# Patient Record
Sex: Female | Born: 1990 | Race: White | Hispanic: No | Marital: Single | Smoking: Current every day smoker
Health system: Southern US, Community
[De-identification: ages and names within clinical notes are randomized; demographics above are authoritative.]

## PROBLEM LIST (undated history)

## (undated) ENCOUNTER — Inpatient Hospital Stay (HOSPITAL_COMMUNITY): Payer: Self-pay

## (undated) DIAGNOSIS — Z30432 Encounter for removal of intrauterine contraceptive device: Secondary | ICD-10-CM

## (undated) DIAGNOSIS — Z975 Presence of (intrauterine) contraceptive device: Secondary | ICD-10-CM

## (undated) DIAGNOSIS — E039 Hypothyroidism, unspecified: Secondary | ICD-10-CM

## (undated) DIAGNOSIS — D509 Iron deficiency anemia, unspecified: Secondary | ICD-10-CM

## (undated) DIAGNOSIS — Z309 Encounter for contraceptive management, unspecified: Secondary | ICD-10-CM

## (undated) HISTORY — DX: Hypothyroidism, unspecified: E03.9

## (undated) HISTORY — DX: Presence of (intrauterine) contraceptive device: Z97.5

## (undated) HISTORY — DX: Iron deficiency anemia, unspecified: D50.9

## (undated) HISTORY — DX: Encounter for contraceptive management, unspecified: Z30.9

## (undated) HISTORY — PX: WISDOM TOOTH EXTRACTION: SHX21

## (undated) HISTORY — DX: Encounter for removal of intrauterine contraceptive device: Z30.432

---
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
## 2014-11-10 ENCOUNTER — Encounter: Payer: Self-pay | Admitting: *Deleted

## 2014-11-24 ENCOUNTER — Other Ambulatory Visit: Payer: Self-pay | Admitting: Women's Health

## 2014-11-27 ENCOUNTER — Other Ambulatory Visit (HOSPITAL_COMMUNITY)
Admission: RE | Admit: 2014-11-27 | Discharge: 2014-11-27 | Disposition: A | Discharge status: Home / Self Care | Payer: Medicaid Other | Source: Ambulatory Visit | Attending: Adult Health | Admitting: Adult Health

## 2014-11-27 ENCOUNTER — Ambulatory Visit (INDEPENDENT_AMBULATORY_CARE_PROVIDER_SITE_OTHER): Payer: Medicaid Other | Admitting: Adult Health

## 2014-11-27 ENCOUNTER — Encounter: Payer: Self-pay | Admitting: Adult Health

## 2014-11-27 ENCOUNTER — Other Ambulatory Visit: Payer: Self-pay | Admitting: Adult Health

## 2014-11-27 VITALS — BP 112/60 | HR 80 | Ht 67.75 in | Wt 121.0 lb

## 2014-11-27 DIAGNOSIS — Z113 Encounter for screening for infections with a predominantly sexual mode of transmission: Secondary | ICD-10-CM | POA: Diagnosis present

## 2014-11-27 DIAGNOSIS — Z308 Encounter for other contraceptive management: Secondary | ICD-10-CM

## 2014-11-27 DIAGNOSIS — Z975 Presence of (intrauterine) contraceptive device: Secondary | ICD-10-CM | POA: Insufficient documentation

## 2014-11-27 DIAGNOSIS — Z01411 Encounter for gynecological examination (general) (routine) with abnormal findings: Secondary | ICD-10-CM | POA: Insufficient documentation

## 2014-11-27 DIAGNOSIS — Z30011 Encounter for initial prescription of contraceptive pills: Secondary | ICD-10-CM

## 2014-11-27 DIAGNOSIS — Z01419 Encounter for gynecological examination (general) (routine) without abnormal findings: Secondary | ICD-10-CM

## 2014-11-27 DIAGNOSIS — Z309 Encounter for contraceptive management, unspecified: Secondary | ICD-10-CM

## 2014-11-27 DIAGNOSIS — Z30431 Encounter for routine checking of intrauterine contraceptive device: Secondary | ICD-10-CM

## 2014-11-27 HISTORY — DX: Encounter for contraceptive management, unspecified: Z30.9

## 2014-11-27 HISTORY — DX: Presence of (intrauterine) contraceptive device: Z97.5

## 2014-11-27 MED ORDER — NORETHIN ACE-ETH ESTRAD-FE 1-20 MG-MCG(24) PO CHEW: 1.0000 | CHEWABLE_TABLET | Freq: Every day | ORAL | Status: DC

## 2014-11-27 NOTE — Patient Instructions (Signed)
Start minastrin Sunday  Use condoms  Return in 1 week for IUD removal Oral Contraception Use Oral contraceptive pills (OCPs) are medicines taken to prevent pregnancy. OCPs work by preventing the ovaries from releasing eggs. The hormones in OCPs also cause the cervical mucus to thicken, preventing the sperm from entering the uterus. The hormones also cause the uterine lining to become thin, not allowing a fertilized egg to attach to the inside of the uterus. OCPs are highly effective when taken exactly as prescribed. However, OCPs do not prevent sexually transmitted diseases (STDs). Safe sex practices, such as using condoms along with an OCP, can help prevent STDs. Before taking OCPs, you may have a physical exam and Pap test. Your health care provider may also order blood tests if necessary. Your health care provider will make sure you are a good candidate for oral contraception. Discuss with your health care provider the possible side effects of the OCP you may be prescribed. When starting an OCP, it can take 2 to 3 months for the body to adjust to the changes in hormone levels in your body.  HOW TO TAKE ORAL CONTRACEPTIVE PILLS Your health care provider may advise you on how to start taking the first cycle of OCPs. Otherwise, you can:   Start on day 1 of your menstrual period. You will not need any backup contraceptive protection with this start time.   Start on the first Sunday after your menstrual period or the day you get your prescription. In these cases, you will need to use backup contraceptive protection for the first week.   Start the pill at any time of your cycle. If you take the pill within 5 days of the start of your period, you are protected against pregnancy right away. In this case, you will not need a backup form of birth control. If you start at any other time of your menstrual cycle, you will need to use another form of birth control for 7 days. If your OCP is the type called a  minipill, it will protect you from pregnancy after taking it for 2 days (48 hours). After you have started taking OCPs:   If you forget to take 1 pill, take it as soon as you remember. Take the next pill at the regular time.   If you miss 2 or more pills, call your health care provider because different pills have different instructions for missed doses. Use backup birth control until your next menstrual period starts.   If you use a 28-day pack that contains inactive pills and you miss 1 of the last 7 pills (pills with no hormones), it will not matter. Throw away the rest of the non-hormone pills and start a new pill pack.  No matter which day you start the OCP, you will always start a new pack on that same day of the week. Have an extra pack of OCPs and a backup contraceptive method available in case you miss some pills or lose your OCP pack.  HOME CARE INSTRUCTIONS   Do not smoke.   Always use a condom to protect against STDs. OCPs do not protect against STDs.   Use a calendar to mark your menstrual period days.   Read the information and directions that came with your OCP. Talk to your health care provider if you have questions.  SEEK MEDICAL CARE IF:   You develop nausea and vomiting.   You have abnormal vaginal discharge or bleeding.   You develop  a rash.   You miss your menstrual period.   You are losing your hair.   You need treatment for mood swings or depression.   You get dizzy when taking the OCP.   You develop acne from taking the OCP.   You become pregnant.  SEEK IMMEDIATE MEDICAL CARE IF:   You develop chest pain.   You develop shortness of breath.   You have an uncontrolled or severe headache.   You develop numbness or slurred speech.   You develop visual problems.   You develop pain, redness, and swelling in the legs.  Document Released: 10/26/2011 Document Revised: 03/23/2014 Document Reviewed: 04/27/2013 [facility] Patient  Information 2015 [place] [place]. This information is not intended to replace advice given to you by your health care provider. Make sure you discuss any questions you have with your health care provider.

## 2014-11-27 NOTE — Progress Notes (Signed)
Patient ID: Debra [name], female   DOB: [birth year], 24 y.o.   MRN: [id number] History of Present Illness: Debra [name] is a 24 year old white female in for pap and physical and get on OCs, IUD needs removal.   Current Medications, Allergies, Past Medical History, Past Surgical History, Family History and Social History were reviewed in [facility] electronic [facility].     Review of Systems: Patient denies any headaches, blurred vision, shortness of breath, chest pain, abdominal pain, problems with bowel movements, urination, or intercourse.  No joint pain or mood swings.Spotting some, had IUD for almost 6 years.   Physical Exam:BP 112/60 mmHg  Pulse 80  Ht 5' 7.75" (1.721 m)  Wt 121 lb (54.885 kg)  BMI 18.53 kg/m2 General:  Well developed, well nourished, no acute distress Skin:  Warm and dry Neck:  Midline trachea, normal thyroid Lungs; Clear to auscultation bilaterally Breast:  No dominant palpable mass, retraction, or nipple discharge Cardiovascular: Regular rate and rhythm Abdomen:  Soft, non tender, no hepatosplenomegaly Pelvic:  External genitalia is normal in appearance.  The vagina is normal in appearance, has pink discharge. The cervix is smooth with + IUD strings.Pap with GC/CHL performed.  Uterus is felt to be normal size, shape, and contour.  No   adnexal masses or tenderness noted. Extremities:  No swelling or varicosities noted Psych:  No mood changes, alert and cooperative,seems happy  Impression:  Well woman gyn exam with pap IUD in place  Contraceptive management   Plan: Rx minastrin take 1 daily starting Sunday with 11 refills, use condoms Return in 1 week for IUD removal  Physical in 1 year Check HIV,RPR and HSV 2 Review handout on OC use

## 2014-11-28 LAB — RPR

## 2014-11-28 LAB — HIV ANTIBODY (ROUTINE TESTING W REFLEX): HIV: NONREACTIVE

## 2014-11-30 ENCOUNTER — Telehealth: Payer: Self-pay | Admitting: Adult Health

## 2014-11-30 LAB — HSV 2 ANTIBODY, IGG: HSV 2 Glycoprotein G Ab, IgG: <0.1 IV

## 2014-11-30 LAB — CYTOLOGY - PAP

## 2014-11-30 NOTE — Telephone Encounter (Signed)
Spoke with pt. Pt to pick up med and start it today. JSY

## 2014-11-30 NOTE — Telephone Encounter (Signed)
I called pharmacy and they got the med(birth control pill). Pt just needs to go by pharmacy and pick up. Start med today and use backup. Left message. JSY

## 2014-12-03 ENCOUNTER — Telehealth: Payer: Self-pay | Admitting: *Deleted

## 2014-12-03 NOTE — Telephone Encounter (Signed)
Try not to smoke but can up til 35 then can't take COC but can POP

## 2014-12-03 NOTE — Telephone Encounter (Signed)
Spoke with pt. She was prescribed Minastrin and the pamphlet read if you smoke, it's not a good idea to take med. She smokes half to one PPD. Please advise. Thanks!! JSY

## 2014-12-04 ENCOUNTER — Encounter: Payer: Self-pay | Admitting: Adult Health

## 2014-12-04 ENCOUNTER — Ambulatory Visit (INDEPENDENT_AMBULATORY_CARE_PROVIDER_SITE_OTHER): Payer: Medicaid Other | Admitting: Adult Health

## 2014-12-04 VITALS — BP 122/82 | Ht 69.0 in | Wt 123.0 lb

## 2014-12-04 DIAGNOSIS — Z3041 Encounter for surveillance of contraceptive pills: Secondary | ICD-10-CM

## 2014-12-04 DIAGNOSIS — Z30432 Encounter for removal of intrauterine contraceptive device: Secondary | ICD-10-CM

## 2014-12-04 HISTORY — DX: Encounter for removal of intrauterine contraceptive device: Z30.432

## 2014-12-04 NOTE — Patient Instructions (Signed)
Continue OCs Use condoms Follow up prn

## 2014-12-04 NOTE — Progress Notes (Signed)
Subjective:     Patient ID: Debra [name], female   DOB: 1990-12-16, 24 y.o.   MRN: [id number]  HPI Debra [name] is a 24 year old white female in for IUD removal, she is taking her OCs and doing Ok with them.  Review of Systems See HPI Reviewed past medical,surgical, social and family history. Reviewed medications and allergies.     Objective:   Physical Exam BP 122/82 mmHg  Ht 5\' 9"  (1.753 m)  Wt 123 lb (55.792 kg)  BMI 18.16 kg/m2   Skin warm and dry.Pelvic: external genitalia is normal in appearance, vagina: white discharge without odor, cervix:smooth, IUD strings seen and grasped with forceps and IUD easily removed, uterus: normal size, shape and contour, non tender, no masses felt, adnexa: no masses or tenderness noted.  Try to decrease smoking.  Assessment:     IUD removal Contraceptive management    Plan:     Continue OCs Use condoms Follow up prn

## 2015-10-21 ENCOUNTER — Ambulatory Visit: Payer: Self-pay | Admitting: Physician Assistant

## 2016-11-20 DIAGNOSIS — O165 Unspecified maternal hypertension, complicating the puerperium: Secondary | ICD-10-CM

## 2016-11-20 HISTORY — DX: Unspecified maternal hypertension, complicating the puerperium: O16.5

## 2016-11-20 NOTE — L&D Delivery Note (Signed)
Delivery Note At 9:18 PM a viable and healthy female was delivered via Vaginal, Spontaneous (Presentation:ROA).  APGAR: 9, 9; weight 5 lb 14 oz .   Placenta status:  Intact, delivered , .  Cord:  with the following complications:  None .  Cord pH: n/a  Anesthesia:  None Episiotomy: None Lacerations:  1st degree perineal; right labial avulsion Suture Repair: 4.0 chromic Est. Blood Loss (mL):  300  Mom to postpartum.  Baby to Couplet care / Skin to Skin.  Eino FarberWalidah Elenora FenderKarim 11/13/2017, 9:51 PM

## 2017-04-03 ENCOUNTER — Ambulatory Visit: Payer: Self-pay | Admitting: Adult Health

## 2017-04-03 ENCOUNTER — Encounter: Payer: Self-pay | Admitting: Adult Health

## 2017-04-03 VITALS — BP 126/62 | HR 74 | Ht 69.0 in | Wt 128.0 lb

## 2017-04-03 DIAGNOSIS — O3680X0 Pregnancy with inconclusive fetal viability, not applicable or unspecified: Secondary | ICD-10-CM

## 2017-04-03 DIAGNOSIS — N926 Irregular menstruation, unspecified: Secondary | ICD-10-CM | POA: Diagnosis not present

## 2017-04-03 DIAGNOSIS — Z3201 Encounter for pregnancy test, result positive: Secondary | ICD-10-CM | POA: Diagnosis not present

## 2017-04-03 DIAGNOSIS — Z3490 Encounter for supervision of normal pregnancy, unspecified, unspecified trimester: Secondary | ICD-10-CM

## 2017-04-03 LAB — POCT URINE PREGNANCY: PREG TEST UR: POSITIVE — AB

## 2017-04-03 NOTE — Progress Notes (Signed)
Subjective:     Patient ID: Debra [name], female   DOB: [birth year], 26 y.o.   MRN: [id number]  HPI Debra [name] is a 26 year old white female in for UPT has missed a period and had 3+HPT.   Review of Systems +missed period, with +HPT Denies any nausea or vomiting Reviewed past medical,surgical, social and family history. Reviewed medications and allergies.     Objective:   Physical Exam BP 126/62 (BP Location: Left Arm, Patient Position: Sitting, Cuff Size: Normal)   Pulse 74   Ht 5\' 9"  (1.753 m)   Wt 128 lb (58.1 kg)   LMP 02/20/2017   BMI 18.90 kg/m UPT +, about 6 weeks by LMP, with EDD 11/27/17, Skin warm and dry. Neck: mid line trachea, normal thyroid, good ROM, no lymphadenopathy noted. Lungs: clear to ausculation bilaterally. Cardiovascular: regular rate and rhythm.Abdomen is soft and non tender.    Assessment:     Positive pregnancy test - Plan: POCT urine pregnancy  Pregnancy, unspecified gestational age  Encounter to determine fetal viability of pregnancy, single or unspecified fetus - Plan: US OB Comp Less 14 Wks      Plan:     Return in 2 weeks for dating US Review handout on first trimester  Decrease smoking

## 2017-04-03 NOTE — Patient Instructions (Signed)
First Trimester of Pregnancy The first trimester of pregnancy is from week 1 until the end of week 13 (months 1 through 3). A week after a sperm fertilizes an egg, the egg will implant on the wall of the uterus. This embryo will begin to develop into a baby. Genes from you and your partner will form the baby. The female genes will determine whether the baby will be a boy or a girl. At 6-8 weeks, the eyes and face will be formed, and the heartbeat can be seen on ultrasound. At the end of 12 weeks, all the baby's organs will be formed. Now that you are pregnant, you will want to do everything you can to have a healthy baby. Two of the most important things are to get good prenatal care and to follow your health care provider's instructions. Prenatal care is all the medical care you receive before the baby's birth. This care will help prevent, find, and treat any problems during the pregnancy and childbirth. Body changes during your first trimester Your body goes through many changes during pregnancy. The changes vary from woman to woman.  You may gain or lose a couple of pounds at first.  You may feel sick to your stomach (nauseous) and you may throw up (vomit). If the vomiting is uncontrollable, call your health care provider.  You may tire easily.  You may develop headaches that can be relieved by medicines. All medicines should be approved by your health care provider.  You may urinate more often. Painful urination may mean you have a bladder infection.  You may develop heartburn as a result of your pregnancy.  You may develop constipation because certain hormones are causing the muscles that push stool through your intestines to slow down.  You may develop hemorrhoids or swollen veins (varicose veins).  Your breasts may begin to grow larger and become tender. Your nipples may stick out more, and the tissue that surrounds them (areola) may become darker.  Your gums may bleed and may be  sensitive to brushing and flossing.  Dark spots or blotches (chloasma, mask of pregnancy) may develop on your face. This will likely fade after the baby is born.  Your menstrual periods will stop.  You may have a loss of appetite.  You may develop cravings for certain kinds of food.  You may have changes in your emotions from day to day, such as being excited to be pregnant or being concerned that something may go wrong with the pregnancy and baby.  You may have more vivid and strange dreams.  You may have changes in your hair. These can include thickening of your hair, rapid growth, and changes in texture. Some women also have hair loss during or after pregnancy, or hair that feels dry or thin. Your hair will most likely return to normal after your baby is born.  What to expect at prenatal visits During a routine prenatal visit:  You will be weighed to make sure you and the baby are growing normally.  Your blood pressure will be taken.  Your abdomen will be measured to track your baby's growth.  The fetal heartbeat will be listened to between weeks 10 and 14 of your pregnancy.  Test results from any previous visits will be discussed.  Your health care provider may ask you:  How you are feeling.  If you are feeling the baby move.  If you have had any abnormal symptoms, such as leaking fluid, bleeding, severe headaches,   or abdominal cramping.  If you are using any tobacco products, including cigarettes, chewing tobacco, and electronic cigarettes.  If you have any questions.  Other tests that may be performed during your first trimester include:  Blood tests to find your blood type and to check for the presence of any previous infections. The tests will also be used to check for low iron levels (anemia) and protein on red blood cells (Rh antibodies). Depending on your risk factors, or if you previously had diabetes during pregnancy, you may have tests to check for high blood  sugar that affects pregnant women (gestational diabetes).  Urine tests to check for infections, diabetes, or protein in the urine.  An ultrasound to confirm the proper growth and development of the baby.  Fetal screens for spinal cord problems (spina bifida) and Down syndrome.  HIV (human immunodeficiency virus) testing. Routine prenatal testing includes screening for HIV, unless you choose not to have this test.  You may need other tests to make sure you and the baby are doing well.  Follow these instructions at home: Medicines  Follow your health care provider's instructions regarding medicine use. Specific medicines may be either safe or unsafe to take during pregnancy.  Take a prenatal vitamin that contains at least 600 micrograms (mcg) of folic acid.  If you develop constipation, try taking a stool softener if your health care provider approves. Eating and drinking  Eat a balanced diet that includes fresh fruits and vegetables, whole grains, good sources of protein such as meat, eggs, or tofu, and low-fat dairy. Your health care provider will help you determine the amount of weight gain that is right for you.  Avoid raw meat and uncooked cheese. These carry germs that can cause birth defects in the baby.  Eating four or five small meals rather than three large meals a day may help relieve nausea and vomiting. If you start to feel nauseous, eating a few soda crackers can be helpful. Drinking liquids between meals, instead of during meals, also seems to help ease nausea and vomiting.  Limit foods that are high in fat and processed sugars, such as fried and sweet foods.  To prevent constipation: ? Eat foods that are high in fiber, such as fresh fruits and vegetables, whole grains, and beans. ? Drink enough fluid to keep your urine clear or pale yellow. Activity  Exercise only as directed by your health care provider. Most women can continue their usual exercise routine during  pregnancy. Try to exercise for 30 minutes at least 5 days a week. Exercising will help you: ? Control your weight. ? Stay in shape. ? Be prepared for labor and delivery.  Experiencing pain or cramping in the lower abdomen or lower back is a good sign that you should stop exercising. Check with your health care provider before continuing with normal exercises.  Try to avoid standing for long periods of time. Move your legs often if you must stand in one place for a long time.  Avoid heavy lifting.  Wear low-heeled shoes and practice good posture.  You may continue to have sex unless your health care provider tells you not to. Relieving pain and discomfort  Wear a good support bra to relieve breast tenderness.  Take warm sitz baths to soothe any pain or discomfort caused by hemorrhoids. Use hemorrhoid cream if your health care provider approves.  Rest with your legs elevated if you have leg cramps or low back pain.  If you develop   varicose veins in your legs, wear support hose. Elevate your feet for 15 minutes, 3-4 times a day. Limit salt in your diet. Prenatal care  Schedule your prenatal visits by the twelfth week of pregnancy. They are usually scheduled monthly at first, then more often in the last 2 months before delivery.  Write down your questions. Take them to your prenatal visits.  Keep all your prenatal visits as told by your health care provider. This is important. Safety  Wear your seat belt at all times when driving.  Make a list of emergency phone numbers, including numbers for family, friends, the hospital, and police and fire departments. General instructions  Ask your health care provider for a referral to a local prenatal education class. Begin classes no later than the beginning of month 6 of your pregnancy.  Ask for help if you have counseling or nutritional needs during pregnancy. Your health care provider can offer advice or refer you to specialists for help  with various needs.  Do not use hot tubs, steam rooms, or saunas.  Do not douche or use tampons or scented sanitary pads.  Do not cross your legs for long periods of time.  Avoid cat litter boxes and soil used by cats. These carry germs that can cause birth defects in the baby and possibly loss of the fetus by miscarriage or stillbirth.  Avoid all smoking, herbs, alcohol, and medicines not prescribed by your health care provider. Chemicals in these products affect the formation and growth of the baby.  Do not use any products that contain nicotine or tobacco, such as cigarettes and e-cigarettes. If you need help quitting, ask your health care provider. You may receive counseling support and other resources to help you quit.  Schedule a dentist appointment. At home, brush your teeth with a soft toothbrush and be gentle when you floss. Contact a health care provider if:  You have dizziness.  You have mild pelvic cramps, pelvic pressure, or nagging pain in the abdominal area.  You have persistent nausea, vomiting, or diarrhea.  You have a bad smelling vaginal discharge.  You have pain when you urinate.  You notice increased swelling in your face, hands, legs, or ankles.  You are exposed to fifth disease or chickenpox.  You are exposed to German measles (rubella) and have never had it. Get help right away if:  You have a fever.  You are leaking fluid from your vagina.  You have spotting or bleeding from your vagina.  You have severe abdominal cramping or pain.  You have rapid weight gain or loss.  You vomit blood or material that looks like coffee grounds.  You develop a severe headache.  You have shortness of breath.  You have any kind of trauma, such as from a fall or a car accident. Summary  The first trimester of pregnancy is from week 1 until the end of week 13 (months 1 through 3).  Your body goes through many changes during pregnancy. The changes vary from  woman to woman.  You will have routine prenatal visits. During those visits, your health care provider will examine you, discuss any test results you may have, and talk with you about how you are feeling. This information is not intended to replace advice given to you by your health care provider. Make sure you discuss any questions you have with your health care provider. Document Released: 10/31/2001 Document Revised: 10/18/2016 Document Reviewed: 10/18/2016 Elsevier Interactive Patient Education  2017 [employer]   Inc.  

## 2017-04-17 ENCOUNTER — Ambulatory Visit (INDEPENDENT_AMBULATORY_CARE_PROVIDER_SITE_OTHER): Payer: Medicaid Other

## 2017-04-17 DIAGNOSIS — O3680X0 Pregnancy with inconclusive fetal viability, not applicable or unspecified: Secondary | ICD-10-CM

## 2017-04-17 NOTE — Progress Notes (Signed)
US 9+6 wks,single IUP w/ys,pos fht 157 bpm,normal ovaries bilat,crl 30.55 mm,EDD 11/14/2017

## 2017-05-02 ENCOUNTER — Ambulatory Visit (INDEPENDENT_AMBULATORY_CARE_PROVIDER_SITE_OTHER): Payer: Medicaid Other | Admitting: Women's Health

## 2017-05-02 ENCOUNTER — Ambulatory Visit: Payer: Medicaid Other | Admitting: *Deleted

## 2017-05-02 ENCOUNTER — Encounter: Payer: Self-pay | Admitting: Women's Health

## 2017-05-02 VITALS — BP 118/70 | HR 84 | Wt 131.0 lb

## 2017-05-02 DIAGNOSIS — Z1389 Encounter for screening for other disorder: Secondary | ICD-10-CM

## 2017-05-02 DIAGNOSIS — F17200 Nicotine dependence, unspecified, uncomplicated: Secondary | ICD-10-CM

## 2017-05-02 DIAGNOSIS — Z720 Tobacco use: Secondary | ICD-10-CM

## 2017-05-02 DIAGNOSIS — O99331 Smoking (tobacco) complicating pregnancy, first trimester: Secondary | ICD-10-CM | POA: Diagnosis not present

## 2017-05-02 DIAGNOSIS — O0990 Supervision of high risk pregnancy, unspecified, unspecified trimester: Secondary | ICD-10-CM | POA: Insufficient documentation

## 2017-05-02 DIAGNOSIS — Z3A12 12 weeks gestation of pregnancy: Secondary | ICD-10-CM

## 2017-05-02 DIAGNOSIS — Z331 Pregnant state, incidental: Secondary | ICD-10-CM

## 2017-05-02 DIAGNOSIS — Z3481 Encounter for supervision of other normal pregnancy, first trimester: Secondary | ICD-10-CM

## 2017-05-02 DIAGNOSIS — Z3682 Encounter for antenatal screening for nuchal translucency: Secondary | ICD-10-CM

## 2017-05-02 LAB — POCT URINALYSIS DIPSTICK
Glucose, UA: NEGATIVE
KETONES UA: NEGATIVE
LEUKOCYTES UA: NEGATIVE
Nitrite, UA: NEGATIVE
RBC UA: NEGATIVE

## 2017-05-02 NOTE — Patient Instructions (Signed)

## 2017-05-02 NOTE — Progress Notes (Signed)
  Subjective:  Debra [name] is a 26 y.o. 392P0010 Caucasian female at 8130w0d by 9wk u/s, being seen today for her first obstetrical visit.  Her obstetrical history is significant for EAB x 1, smoker- 1-1.5ppd prior to pregnancy, now only occ w/ stress and trying to cut out comletely.  Pregnancy history fully reviewed.  Patient reports no complaints. Denies vb, cramping, uti s/s, abnormal/malodorous vag d/c, or vulvovaginal itching/irritation.  BP 118/70   Pulse 84   Wt 131 lb (59.4 kg)   LMP 02/20/2017 (Exact Date)   BMI 19.35 kg/m   HISTORY: OB History  Gravida Para Term Preterm AB Living  2       1 0  SAB TAB Ectopic Multiple Live Births    1          # Outcome Date GA Lbr Len/2nd Weight Sex Delivery Anes PTL Lv  2 Current           1 TAB 2009             Past Medical History:  Diagnosis Date  . Contraceptive management 11/27/2014  . Encounter for IUD removal 12/04/2014  . IUD (intrauterine device) in place 11/27/2014   Past Surgical History:  Procedure Laterality Date  . HERNIA REPAIR    . WISDOM TOOTH EXTRACTION     Family History  Problem Relation Age of Onset  . Other Maternal Grandmother        MVA  . Other Maternal Grandfather        fibrosis of lungs  . Cancer Paternal Grandmother   . COPD Paternal Grandmother   . Cancer Paternal Grandfather        lung   . Heart attack Maternal Uncle     Exam   System:     General: Well developed & nourished, no acute distress   Skin: Warm & dry, normal coloration and turgor, no rashes   Neurologic: Alert & oriented, normal mood   Cardiovascular: Regular rate & rhythm   Respiratory: Effort & rate normal, LCTAB, acyanotic   Abdomen: Soft, non tender   Extremities: normal strength, tone  Thin prep pap smear neg 11/27/14  FHR: 162 via doppler   Assessment:   Pregnancy: G2P0010 Patient Active Problem List   Diagnosis Date Noted  . Supervision of normal pregnancy 05/02/2017  . Smoker 05/02/2017     8730w0d G2P0010 New OB visit Smoker  Plan:  Initial labs obtained Continue prenatal vitamins Problem list reviewed and updated Reviewed n/v relief measures and warning s/s to report Reviewed recommended weight gain based on pre-gravid BMI Encouraged well-balanced diet Genetic Screening discussed Integrated Screen: requested Cystic fibrosis screening discussed declined Ultrasound discussed; fetal survey: requested Follow up in 1 week for 1st IT/NT (no visit), then 4wks for visit and 2nd IT Smokes occ w/ stress, advised cessation, discussed risks to fetus while pregnant, to infant pp, and to herself. Offered QuitlineNC, declined, wants to quit on her own CCNC completed  Marge DuncansBooker, Ivon Oelkers Randall CNM, [facility]NP-BC 05/02/2017 10:29 AM

## 2017-05-03 ENCOUNTER — Encounter: Payer: Self-pay | Admitting: Women's Health

## 2017-05-03 DIAGNOSIS — F1290 Cannabis use, unspecified, uncomplicated: Secondary | ICD-10-CM | POA: Insufficient documentation

## 2017-05-03 LAB — PMP SCREEN PROFILE (10S), URINE
AMPHETAMINE SCREEN URINE: NEGATIVE ng/mL
BARBITURATE SCREEN URINE: NEGATIVE ng/mL
BENZODIAZEPINE SCREEN, URINE: NEGATIVE ng/mL
CANNABINOIDS UR QL SCN: POSITIVE ng/mL
CREATININE(CRT), U: 192.7 mg/dL (ref 20.0–300.0)
Cocaine (Metab) Scrn, Ur: NEGATIVE ng/mL
Methadone Screen, Urine: NEGATIVE ng/mL
OPIATE SCREEN URINE: NEGATIVE ng/mL
OXYCODONE+OXYMORPHONE UR QL SCN: NEGATIVE ng/mL
PH UR, DRUG SCRN: 7.1 (ref 4.5–8.9)
PHENCYCLIDINE QUANTITATIVE URINE: NEGATIVE ng/mL
Propoxyphene Scrn, Ur: NEGATIVE ng/mL

## 2017-05-03 LAB — CBC
HEMATOCRIT: 40.9 % (ref 34.0–46.6)
HEMOGLOBIN: 14 g/dL (ref 11.1–15.9)
MCH: 29.9 pg (ref 26.6–33.0)
MCHC: 34.2 g/dL (ref 31.5–35.7)
MCV: 87 fL (ref 79–97)
Platelets: 169 10*3/uL (ref 150–379)
RBC: 4.68 x10E6/uL (ref 3.77–5.28)
RDW: 12.5 % (ref 12.3–15.4)
WBC: 8.8 10*3/uL (ref 3.4–10.8)

## 2017-05-03 LAB — MICROSCOPIC EXAMINATION
Casts: NONE SEEN /lpf
Epithelial Cells (non renal): >10 /hpf — AB (ref 0–10)

## 2017-05-03 LAB — URINALYSIS, ROUTINE W REFLEX MICROSCOPIC
BILIRUBIN UA: NEGATIVE
GLUCOSE, UA: NEGATIVE
KETONES UA: NEGATIVE
NITRITE UA: POSITIVE — AB
RBC UA: NEGATIVE
SPEC GRAV UA: 1.025 (ref 1.005–1.030)
Urobilinogen, Ur: 1 mg/dL (ref 0.2–1.0)
pH, UA: 7 (ref 5.0–7.5)

## 2017-05-03 LAB — RUBELLA SCREEN: RUBELLA: 1.57 {index} (ref >0.99)

## 2017-05-03 LAB — HIV ANTIBODY (ROUTINE TESTING W REFLEX): HIV Screen 4th Generation wRfx: NONREACTIVE

## 2017-05-03 LAB — HEPATITIS B SURFACE ANTIGEN: HEP B S AG: NEGATIVE

## 2017-05-03 LAB — ABO/RH: RH TYPE: POSITIVE

## 2017-05-03 LAB — VARICELLA ZOSTER ANTIBODY, IGG

## 2017-05-03 LAB — ANTIBODY SCREEN: Antibody Screen: NEGATIVE

## 2017-05-03 LAB — RPR: RPR Ser Ql: NONREACTIVE

## 2017-05-04 LAB — GC/CHLAMYDIA PROBE AMP
CHLAMYDIA, DNA PROBE: NEGATIVE
NEISSERIA GONORRHOEAE BY PCR: NEGATIVE

## 2017-05-06 LAB — URINE CULTURE

## 2017-05-07 ENCOUNTER — Telehealth: Payer: Self-pay | Admitting: *Deleted

## 2017-05-07 ENCOUNTER — Other Ambulatory Visit: Payer: Self-pay | Admitting: Women's Health

## 2017-05-07 DIAGNOSIS — O9989 Other specified diseases and conditions complicating pregnancy, childbirth and the puerperium: Principal | ICD-10-CM

## 2017-05-07 DIAGNOSIS — R8271 Bacteriuria: Secondary | ICD-10-CM | POA: Insufficient documentation

## 2017-05-07 MED ORDER — AMOXICILLIN-POT CLAVULANATE 875-125 MG PO TABS: 1.0000 | ORAL_TABLET | Freq: Two times a day (BID) | ORAL | 0 refills | Status: DC

## 2017-05-07 NOTE — Telephone Encounter (Signed)
LMOVM that urine culture showed UTI and prescription was sent to pharmacy per Selena BattenKim. Advised to take medication even if she was not symptomatic.

## 2017-05-08 ENCOUNTER — Telehealth: Payer: Self-pay | Admitting: Obstetrics & Gynecology

## 2017-05-08 NOTE — Telephone Encounter (Signed)
Left message x 1. JSY 

## 2017-05-09 NOTE — Telephone Encounter (Signed)
Informed patient of lab results.  Verbalized understanding. 

## 2017-05-10 ENCOUNTER — Ambulatory Visit (INDEPENDENT_AMBULATORY_CARE_PROVIDER_SITE_OTHER): Payer: Medicaid Other

## 2017-05-10 DIAGNOSIS — Z3682 Encounter for antenatal screening for nuchal translucency: Secondary | ICD-10-CM

## 2017-05-10 DIAGNOSIS — Z3401 Encounter for supervision of normal first pregnancy, first trimester: Secondary | ICD-10-CM

## 2017-05-10 NOTE — Progress Notes (Signed)
US 13+1 wks,measurements c/w dates,crl 69.95 mm,normal ovaries bilat,fhr 144 bpm,fundal pl gr 0,NB present,unable to obtain NT because of fetal position,pt will come back for additional images

## 2017-05-11 ENCOUNTER — Other Ambulatory Visit: Payer: Self-pay | Admitting: Obstetrics & Gynecology

## 2017-05-11 DIAGNOSIS — IMO0002 Reserved for concepts with insufficient information to code with codable children: Secondary | ICD-10-CM

## 2017-05-11 DIAGNOSIS — Z0489 Encounter for examination and observation for other specified reasons: Secondary | ICD-10-CM

## 2017-05-14 ENCOUNTER — Other Ambulatory Visit: Payer: Self-pay | Admitting: Obstetrics & Gynecology

## 2017-05-14 ENCOUNTER — Other Ambulatory Visit: Payer: Medicaid Other

## 2017-05-14 ENCOUNTER — Ambulatory Visit (INDEPENDENT_AMBULATORY_CARE_PROVIDER_SITE_OTHER): Payer: Medicaid Other

## 2017-05-14 DIAGNOSIS — IMO0002 Reserved for concepts with insufficient information to code with codable children: Secondary | ICD-10-CM

## 2017-05-14 DIAGNOSIS — Z3682 Encounter for antenatal screening for nuchal translucency: Secondary | ICD-10-CM | POA: Diagnosis not present

## 2017-05-14 DIAGNOSIS — F17200 Nicotine dependence, unspecified, uncomplicated: Secondary | ICD-10-CM

## 2017-05-14 DIAGNOSIS — Z0489 Encounter for examination and observation for other specified reasons: Secondary | ICD-10-CM

## 2017-05-14 DIAGNOSIS — O99331 Smoking (tobacco) complicating pregnancy, first trimester: Secondary | ICD-10-CM | POA: Diagnosis not present

## 2017-05-14 DIAGNOSIS — Z1379 Encounter for other screening for genetic and chromosomal anomalies: Secondary | ICD-10-CM

## 2017-05-14 NOTE — Progress Notes (Signed)
US 13+5 wks,measurements c/w dates,fhr 149 bpm,NB present,NT 1.3 mm,normal ovaries bilat,crl 74.75 mm

## 2017-05-17 LAB — MATERNAL SCREEN, INTEGRATED #1
Crown Rump Length: 74.8 mm
GEST. AGE ON COLLECTION DATE: 13.1 wk
MATERNAL AGE AT EDD: 26.2 a
NUCHAL TRANSLUCENCY (NT): 1.3 mm
Number of Fetuses: 1
PAPP-A Value: 1285 ng/mL
WEIGHT: 130 [lb_av]

## 2017-05-30 ENCOUNTER — Ambulatory Visit (INDEPENDENT_AMBULATORY_CARE_PROVIDER_SITE_OTHER): Payer: Medicaid Other | Admitting: Advanced Practice Midwife

## 2017-05-30 ENCOUNTER — Encounter: Payer: Self-pay | Admitting: Advanced Practice Midwife

## 2017-05-30 VITALS — BP 110/66 | HR 69 | Wt 134.0 lb

## 2017-05-30 DIAGNOSIS — Z1389 Encounter for screening for other disorder: Secondary | ICD-10-CM

## 2017-05-30 DIAGNOSIS — Z3A16 16 weeks gestation of pregnancy: Secondary | ICD-10-CM

## 2017-05-30 DIAGNOSIS — Z331 Pregnant state, incidental: Secondary | ICD-10-CM

## 2017-05-30 DIAGNOSIS — O2341 Unspecified infection of urinary tract in pregnancy, first trimester: Secondary | ICD-10-CM

## 2017-05-30 DIAGNOSIS — Z3482 Encounter for supervision of other normal pregnancy, second trimester: Secondary | ICD-10-CM

## 2017-05-30 DIAGNOSIS — Z1379 Encounter for other screening for genetic and chromosomal anomalies: Secondary | ICD-10-CM

## 2017-05-30 DIAGNOSIS — Z363 Encounter for antenatal screening for malformations: Secondary | ICD-10-CM

## 2017-05-30 DIAGNOSIS — O99322 Drug use complicating pregnancy, second trimester: Secondary | ICD-10-CM

## 2017-05-30 DIAGNOSIS — F1290 Cannabis use, unspecified, uncomplicated: Secondary | ICD-10-CM

## 2017-05-30 LAB — POCT URINALYSIS DIPSTICK
Blood, UA: NEGATIVE
Glucose, UA: NEGATIVE
KETONES UA: NEGATIVE
LEUKOCYTES UA: NEGATIVE
Nitrite, UA: NEGATIVE
Protein, UA: NEGATIVE

## 2017-05-30 NOTE — Progress Notes (Signed)
G2P0010 3179w0d Estimated Date of Delivery: 11/14/17  Blood pressure 110/66, pulse 69, weight 134 lb (60.8 kg), last menstrual period 02/20/2017.   BP weight and urine results all reviewed and noted.  Please refer to the obstetrical flow sheet for the fundal height and fetal heart rate documentation:  Patient denies any bleeding and no rupture of membranes symptoms or regular contractions. Patient is without complaints. All questions were answered.  Orders Placed This Encounter  Procedures  . Urine Culture  . US OB Comp + 14 Wk  . Maternal Screen, Integrated #2  . Pain Management Screening Profile (10S)  . POCT urinalysis dipstick    Plan:  Continued routine obstetrical care, 2nd IT  Return in about 3 weeks (around 06/20/2017) for LROB, JX:[id number]:Anatomy.

## 2017-05-30 NOTE — Patient Instructions (Signed)

## 2017-05-31 LAB — PMP SCREEN PROFILE (10S), URINE
AMPHETAMINE SCREEN URINE: NEGATIVE ng/mL
BARBITURATE SCREEN URINE: NEGATIVE ng/mL
BENZODIAZEPINE SCREEN, URINE: NEGATIVE ng/mL
CANNABINOIDS UR QL SCN: NEGATIVE ng/mL
COCAINE(METAB.)SCREEN, URINE: NEGATIVE ng/mL
Creatinine(Crt), U: 101.1 mg/dL (ref 20.0–300.0)
METHADONE SCREEN, URINE: NEGATIVE ng/mL
OPIATE SCREEN URINE: NEGATIVE ng/mL
OXYCODONE+OXYMORPHONE UR QL SCN: NEGATIVE ng/mL
PROPOXYPHENE SCREEN URINE: NEGATIVE ng/mL
Ph of Urine: 7.1 (ref 4.5–8.9)
Phencyclidine Qn, Ur: NEGATIVE ng/mL

## 2017-06-01 LAB — URINE CULTURE

## 2017-06-03 LAB — MATERNAL SCREEN, INTEGRATED #2
AFP MARKER: 37.6 ng/mL
AFP MoM: 1.22
Crown Rump Length: 74.8 mm
DIA MoM: 1.75
DIA Value: 329 pg/mL
Estriol, Unconjugated: 1.03 ng/mL
GEST. AGE ON COLLECTION DATE: 13.1 wk
GESTATIONAL AGE: 15.4 wk
HCG MOM: 0.9
HCG VALUE: 38.1 [IU]/mL
MATERNAL AGE AT EDD: 26.2 a
Nuchal Translucency (NT): 1.3 mm
Nuchal Translucency MoM: 0.71
Number of Fetuses: 1
PAPP-A MOM: 0.91
PAPP-A Value: 1285 ng/mL
Test Results:: NEGATIVE
WEIGHT: 130 [lb_av]
Weight: 134 [lb_av]
uE3 MoM: 1.49

## 2017-06-20 ENCOUNTER — Ambulatory Visit (INDEPENDENT_AMBULATORY_CARE_PROVIDER_SITE_OTHER): Payer: Medicaid Other | Admitting: Obstetrics and Gynecology

## 2017-06-20 ENCOUNTER — Ambulatory Visit (INDEPENDENT_AMBULATORY_CARE_PROVIDER_SITE_OTHER): Payer: Medicaid Other

## 2017-06-20 ENCOUNTER — Encounter: Payer: Self-pay | Admitting: Obstetrics and Gynecology

## 2017-06-20 VITALS — BP 100/60 | HR 63 | Wt 136.4 lb

## 2017-06-20 DIAGNOSIS — Z331 Pregnant state, incidental: Secondary | ICD-10-CM

## 2017-06-20 DIAGNOSIS — Z3482 Encounter for supervision of other normal pregnancy, second trimester: Secondary | ICD-10-CM

## 2017-06-20 DIAGNOSIS — Z3A19 19 weeks gestation of pregnancy: Secondary | ICD-10-CM | POA: Diagnosis not present

## 2017-06-20 DIAGNOSIS — Z363 Encounter for antenatal screening for malformations: Secondary | ICD-10-CM | POA: Diagnosis not present

## 2017-06-20 DIAGNOSIS — Z1389 Encounter for screening for other disorder: Secondary | ICD-10-CM

## 2017-06-20 DIAGNOSIS — Z3402 Encounter for supervision of normal first pregnancy, second trimester: Secondary | ICD-10-CM

## 2017-06-20 LAB — POCT URINALYSIS DIPSTICK
Glucose, UA: NEGATIVE
Ketones, UA: NEGATIVE
Nitrite, UA: NEGATIVE
Protein, UA: NEGATIVE

## 2017-06-20 NOTE — Progress Notes (Signed)
US 19 wks,cephalic,cx 3.1 cm,normal ovaries bilat,ant pl gr 0,svp of fluid 6.8 cm,LVEICF 1.7 mm,fhr 143 bpm,efw 248 g,anatomy complete

## 2017-06-20 NOTE — Progress Notes (Signed)
Patient ID: Debra LevanCheyenne [name], female   DOB: Nov 14, 1991, 26 y.o.   MRN: [id number] G2P0010  Estimated Date of Delivery: 11/14/17 LROB 5767w0d  Chief Complaint  Patient presents with  . Routine Prenatal Visit    US today; + back pain; requests prescription for prenatal vits  ____  Patient complaints: Sharp lower back pain. No other complaints at this time.  Patient reports good fetal movement, denies any bleeding , rupture of membranes,or regular contractions.  Blood pressure 100/60, pulse 63, weight 136 lb 6.4 oz (61.9 kg), last menstrual period 02/20/2017.   Urine results:notable for 2+ Leukocytes trace blood otherwise negative refer to the ob flow sheet for FH and FHR, ,                          Physical Examination: General appearance - alert, well appearing, and in no distress                                      Abdomen - FH 16 cm                                                         -FHR 143 per US                                            Questions were answered. Assessment: LROB G2P0010 @ 4567w0d   Plan:  Continued routine obstetrical care,   F/u in 4 weeks for routine LROB  By signing my name below, I, Diona BrownerJennifer Gorman, attest that this documentation has been prepared under the direction and in the presence of Tilda BurrowFerguson, Amatullah Christy V, MD. Electronically Signed: Diona BrownerJennifer Gorman, Medical Scribe. 06/20/17. 11:52 AM.  I personally performed the services described in this documentation, which was SCRIBED in my presence. The recorded information has been reviewed and considered accurate. It has been edited as necessary during review. Tilda BurrowFERGUSON,Michaelene Dutan V, MD

## 2017-06-20 NOTE — Patient Instructions (Signed)
(  [phone] is the phone number for Pregnancy Classes or hospital tours at [facility].  ° °You will be referred to  [email or web address]   for more information on childbirth classes   °At this site you may register for classes. You may sign up for a waiting list if classes are full. Please SIGN UP FOR THIS!.   When the waiting list becomes long, sometimes new classes can be added. ° ° ° °

## 2017-06-27 ENCOUNTER — Encounter: Payer: Self-pay | Admitting: Advanced Practice Midwife

## 2017-07-04 ENCOUNTER — Other Ambulatory Visit: Payer: Self-pay | Admitting: Advanced Practice Midwife

## 2017-07-04 ENCOUNTER — Telehealth: Payer: Self-pay | Admitting: Obstetrics and Gynecology

## 2017-07-04 MED ORDER — PNV PRENATAL PLUS MULTIVITAMIN 27-1 MG PO TABS: 1.0000 | ORAL_TABLET | Freq: Every day | ORAL | 11 refills | Status: DC

## 2017-07-04 NOTE — Telephone Encounter (Signed)
Patient called stating that she seen Dr. Emelda FearFerguson two weeks ago and he was suppose to call her in a refill of her prenatal medication. Pt has been calling her pharmacy for two weeks and they still do not have it. Please  Contact pt

## 2017-07-04 NOTE — Progress Notes (Signed)
PNV sent to pharmacy 

## 2017-07-04 NOTE — Telephone Encounter (Signed)
Spoke with pt. Pt needs prenatal vits called into pharmacy. Thanks!! JSY

## 2017-07-04 NOTE — Telephone Encounter (Signed)
done

## 2017-07-18 ENCOUNTER — Encounter: Payer: Medicaid Other | Admitting: Advanced Practice Midwife

## 2017-07-19 ENCOUNTER — Ambulatory Visit (INDEPENDENT_AMBULATORY_CARE_PROVIDER_SITE_OTHER): Payer: Medicaid Other | Admitting: Obstetrics & Gynecology

## 2017-07-19 ENCOUNTER — Encounter: Payer: Self-pay | Admitting: Obstetrics & Gynecology

## 2017-07-19 VITALS — BP 112/70 | HR 62 | Wt 145.0 lb

## 2017-07-19 DIAGNOSIS — Z331 Pregnant state, incidental: Secondary | ICD-10-CM

## 2017-07-19 DIAGNOSIS — Z3482 Encounter for supervision of other normal pregnancy, second trimester: Secondary | ICD-10-CM

## 2017-07-19 DIAGNOSIS — Z3A23 23 weeks gestation of pregnancy: Secondary | ICD-10-CM

## 2017-07-19 DIAGNOSIS — Z1389 Encounter for screening for other disorder: Secondary | ICD-10-CM

## 2017-07-19 LAB — POCT URINALYSIS DIPSTICK
Blood, UA: NEGATIVE
Glucose, UA: NEGATIVE
KETONES UA: NEGATIVE
Leukocytes, UA: NEGATIVE
Nitrite, UA: NEGATIVE
PROTEIN UA: NEGATIVE

## 2017-07-19 NOTE — Progress Notes (Signed)
G2P0010 [redacted]w[redacted]d Estimated Date of Delivery: 11/14/17  Blood pressure 112/70, pulse 62, weight 145 lb (65.8 kg), last menstrual period 02/20/2017.   BP weight and urine results all reviewed and noted.  Please refer to the obstetrical flow sheet for the fundal height and fetal heart rate documentation:  Patient reports good fetal movement, denies any bleeding and no rupture of membranes symptoms or regular contractions. Patient is without complaints. All questions were answered.  Orders Placed This Encounter  Procedures  . POCT urinalysis dipstick    Plan:  Continued routine obstetrical care,   Return in about 4 weeks (around 08/16/2017) for PN2, LROB.

## 2017-07-25 ENCOUNTER — Telehealth: Payer: Self-pay | Admitting: *Deleted

## 2017-07-25 ENCOUNTER — Encounter: Payer: Self-pay | Admitting: *Deleted

## 2017-08-09 ENCOUNTER — Telehealth: Payer: Self-pay | Admitting: Women's Health

## 2017-08-09 NOTE — Telephone Encounter (Signed)
LMOVM that for sore throat/cold she could take: cepacol throat lozenges, chloraseptic spray; guaifenesin, mucinex, robitussin-alcohol free and sudafed.

## 2017-08-13 ENCOUNTER — Telehealth: Payer: Self-pay | Admitting: *Deleted

## 2017-08-13 NOTE — Telephone Encounter (Signed)
LMOVM that she could try Guaifenesin, cough drops (alcohol free), Robitussin as well. Advised if not better after 7-10 days to let us know.

## 2017-08-16 ENCOUNTER — Other Ambulatory Visit: Payer: Medicaid Other

## 2017-08-16 ENCOUNTER — Ambulatory Visit (INDEPENDENT_AMBULATORY_CARE_PROVIDER_SITE_OTHER): Payer: Medicaid Other | Admitting: Women's Health

## 2017-08-16 ENCOUNTER — Encounter: Payer: Self-pay | Admitting: Women's Health

## 2017-08-16 VITALS — BP 100/62 | HR 80 | Wt 152.0 lb

## 2017-08-16 DIAGNOSIS — Z131 Encounter for screening for diabetes mellitus: Secondary | ICD-10-CM

## 2017-08-16 DIAGNOSIS — Z3A27 27 weeks gestation of pregnancy: Secondary | ICD-10-CM

## 2017-08-16 DIAGNOSIS — Z331 Pregnant state, incidental: Secondary | ICD-10-CM

## 2017-08-16 DIAGNOSIS — Z3482 Encounter for supervision of other normal pregnancy, second trimester: Secondary | ICD-10-CM

## 2017-08-16 DIAGNOSIS — Z1389 Encounter for screening for other disorder: Secondary | ICD-10-CM

## 2017-08-16 LAB — POCT URINALYSIS DIPSTICK
GLUCOSE UA: NEGATIVE
Ketones, UA: NEGATIVE
Protein, UA: NEGATIVE
RBC UA: NEGATIVE
UROBILINOGEN UA: NEGATIVE U/dL — AB

## 2017-08-16 NOTE — Patient Instructions (Signed)

## 2017-08-16 NOTE — Progress Notes (Signed)
   [facility] Low-Risk Pregnancy Visit  Patient name: Debra [name] MRN [id number]  Date of birth: 06/06/1991 CC & HPI:  Debra [name] is a 26 y.o. G2P0010 female at [redacted]w[redacted]d with an Estimated Date of Delivery: 11/14/17 being seen today for ongoing management of a low-risk pregnancy.   Today she reports worked outside in yard last week, woke up next morning with cough/congestion, feels she is getting better, still has slight cough.   Review of Systems:   Reports good fm.   Denies regular uc's, lof, vb, or uti s/s. No complaints.  Pertinent History Reviewed:  Reviewed past medical,surgical and family history.  Reviewed problem list, medications and allergies.  Objective Findings:   Vitals:   08/16/17 0956  BP: 100/62  Pulse: 80  Weight: 152 lb (68.9 kg)    Body mass index is 22.45 kg/m.  Fundal height: 25 Fetal heart rate: 149  Results for orders placed or performed in visit on 08/16/17 (from the past 24 hour(s))  POCT urinalysis dipstick   Collection Time: 08/16/17  9:57 AM  Result Value Ref Range   Color, UA     Clarity, UA     Glucose, UA neg    Bilirubin, UA     Ketones, UA neg    Spec Grav, UA  1.010 - 1.025   Blood, UA neg    pH, UA  5.0 - 8.0   Protein, UA neg    Urobilinogen, UA negative (A) 0.2 or 1.0 E.U./dL   Nitrite, UA     Leukocytes, UA Small (1+) (A) Negative     Assessment & Plan:   1) Low-risk pregnancy G2P0010 at [redacted]w[redacted]d with an Estimated Date of Delivery: 11/14/17   Reviewed: ptl s/s, fkc. Recommended Tdap at HD/PCP per CDC guidelines.  All questions were answered  Plan:  Continue routine obstetrical care   PN2 today, declines flu shot today, maybe next visit  Return in about 3 weeks (around 09/06/2017) for LROB.  Orders Placed This Encounter  Procedures  . POCT urinalysis dipstick    Marge Duncans CNM, [facility] 08/16/2017 10:24 AM

## 2017-08-17 LAB — CBC
HEMATOCRIT: 39 % (ref 34.0–46.6)
HEMOGLOBIN: 13.4 g/dL (ref 11.1–15.9)
MCH: 30.4 pg (ref 26.6–33.0)
MCHC: 34.4 g/dL (ref 31.5–35.7)
MCV: 88 fL (ref 79–97)
PLATELETS: 183 10*3/uL (ref 150–379)
RBC: 4.41 x10E6/uL (ref 3.77–5.28)
RDW: 13.1 % (ref 12.3–15.4)
WBC: 11.9 10*3/uL — AB (ref 3.4–10.8)

## 2017-08-17 LAB — GLUCOSE TOLERANCE, 2 HOURS W/ 1HR
Glucose, 1 hour: 108 mg/dL (ref 65–179)
Glucose, 2 hour: 92 mg/dL (ref 65–152)
Glucose, Fasting: 73 mg/dL (ref 65–91)

## 2017-08-17 LAB — RPR: RPR: NONREACTIVE

## 2017-08-17 LAB — HIV ANTIBODY (ROUTINE TESTING W REFLEX): HIV Screen 4th Generation wRfx: NONREACTIVE

## 2017-08-17 LAB — ANTIBODY SCREEN: ANTIBODY SCREEN: NEGATIVE

## 2017-08-22 ENCOUNTER — Telehealth: Payer: Self-pay | Admitting: Obstetrics & Gynecology

## 2017-08-22 NOTE — Telephone Encounter (Signed)
Spoke with pt letting her know sugar test was normal. Also, explained leukocytes to pt. Advised what a clean catch urine was and advised to catch urine that way next time. Pt voiced understanding. JSY

## 2017-08-22 NOTE — Telephone Encounter (Signed)
Left message x 2. JSY 

## 2017-08-22 NOTE — Telephone Encounter (Signed)
Left message x 1. JSY 

## 2017-09-06 ENCOUNTER — Ambulatory Visit (INDEPENDENT_AMBULATORY_CARE_PROVIDER_SITE_OTHER): Payer: Medicaid Other | Admitting: Advanced Practice Midwife

## 2017-09-06 ENCOUNTER — Encounter: Payer: Self-pay | Admitting: Advanced Practice Midwife

## 2017-09-06 VITALS — BP 122/78 | HR 83 | Wt 162.0 lb

## 2017-09-06 DIAGNOSIS — Z331 Pregnant state, incidental: Secondary | ICD-10-CM

## 2017-09-06 DIAGNOSIS — Z23 Encounter for immunization: Secondary | ICD-10-CM | POA: Diagnosis not present

## 2017-09-06 DIAGNOSIS — Z1389 Encounter for screening for other disorder: Secondary | ICD-10-CM

## 2017-09-06 DIAGNOSIS — Z3483 Encounter for supervision of other normal pregnancy, third trimester: Secondary | ICD-10-CM

## 2017-09-06 DIAGNOSIS — Z3A30 30 weeks gestation of pregnancy: Secondary | ICD-10-CM

## 2017-09-06 DIAGNOSIS — O2686 Pruritic urticarial papules and plaques of pregnancy (PUPPP): Secondary | ICD-10-CM

## 2017-09-06 LAB — POCT URINALYSIS DIPSTICK
Blood, UA: NEGATIVE
GLUCOSE UA: NEGATIVE
KETONES UA: NEGATIVE
LEUKOCYTES UA: NEGATIVE
Nitrite, UA: NEGATIVE
Protein, UA: NEGATIVE

## 2017-09-06 MED ORDER — OMEPRAZOLE 20 MG PO CPDR: 20.0000 mg | DELAYED_RELEASE_CAPSULE | Freq: Every day | ORAL | 4 refills | Status: DC

## 2017-09-06 NOTE — Patient Instructions (Signed)

## 2017-09-06 NOTE — Progress Notes (Signed)
G2P0010 269w1d Estimated Date of Delivery: 11/14/17  Blood pressure 122/78, pulse 83, weight 162 lb (73.5 kg), last menstrual period 02/20/2017.   BP weight and urine results all reviewed and noted.  Please refer to the obstetrical flow sheet for the fundal height and fetal heart rate documentation:  Patient reports good fetal movement, denies any bleeding and no rupture of membranes symptoms or regular contractions. Patient is without complaints other than normal pregnancy complaints.  Has PUPPS rash.  All questions were answered.  Orders Placed This Encounter  Procedures  . Flu Vaccine QUAD 36+ mos IM  . POCT urinalysis dipstick    Plan:  Continued routine obstetrical care, hydrocortisone cream for rash. Rx prilosec if heartburn gets too bad.   Return in about 2 weeks (around 09/20/2017) for LROB.

## 2017-09-13 ENCOUNTER — Telehealth: Payer: Self-pay | Admitting: Obstetrics & Gynecology

## 2017-09-13 NOTE — Telephone Encounter (Signed)
Pt called and stated that she is 31 weeks and that she was told at last visit that she should feel baby at least 10 times in 2 hours. Pt says that she really hasn't felt baby since yesterday. Spoke with Malachy MoodJanet Young, and [facility]. 09/13/17 JM

## 2017-09-20 ENCOUNTER — Encounter: Payer: Self-pay | Admitting: Advanced Practice Midwife

## 2017-09-20 ENCOUNTER — Ambulatory Visit (INDEPENDENT_AMBULATORY_CARE_PROVIDER_SITE_OTHER): Payer: Medicaid Other | Admitting: Advanced Practice Midwife

## 2017-09-20 VITALS — BP 100/60 | HR 76 | Wt 160.0 lb

## 2017-09-20 DIAGNOSIS — Z1389 Encounter for screening for other disorder: Secondary | ICD-10-CM

## 2017-09-20 DIAGNOSIS — O26843 Uterine size-date discrepancy, third trimester: Secondary | ICD-10-CM

## 2017-09-20 DIAGNOSIS — Z331 Pregnant state, incidental: Secondary | ICD-10-CM

## 2017-09-20 DIAGNOSIS — Z3A32 32 weeks gestation of pregnancy: Secondary | ICD-10-CM

## 2017-09-20 DIAGNOSIS — Z3483 Encounter for supervision of other normal pregnancy, third trimester: Secondary | ICD-10-CM

## 2017-09-20 LAB — POCT URINALYSIS DIPSTICK
Blood, UA: NEGATIVE
Glucose, UA: NEGATIVE
KETONES UA: NEGATIVE
LEUKOCYTES UA: NEGATIVE
Nitrite, UA: NEGATIVE
PROTEIN UA: NEGATIVE

## 2017-09-20 NOTE — Patient Instructions (Signed)
[facility], I greatly value your feedback.  If you receive a survey following your visit with us today, we appreciate you taking the time to fill it out.  Thanks, Joellyn HaffKim Booker, CNM, WHNP-BC   Call the office [phone]) or go to [facility] if:  You begin to have strong, frequent contractions  Your water breaks.  Sometimes it is a big gush of fluid, sometimes it is just a trickle that keeps getting your panties wet or running down your legs  You have vaginal bleeding.  It is normal to have a small amount of spotting if your cervix was checked.   You don't feel your baby moving like normal.  If you don't, get you something to eat and drink and lay down and focus on feeling your baby move.  You should feel at least 10 movements in 2 hours.  If you don't, you should call the office or go to [facility].    Tdap Vaccine  It is recommended that you get the Tdap vaccine during the third trimester of EACH pregnancy to help protect your baby from getting pertussis (whooping cough)  27-36 weeks is the BEST time to do this so that you can pass the protection on to your baby. During pregnancy is better than after pregnancy, but if you are unable to get it during pregnancy it will be offered at the hospital.   You can get this vaccine at the health department or your family doctor  Everyone who will be around your baby should also be up-to-date on their vaccines. Adults (who are not pregnant) only need 1 dose of Tdap during adulthood.   Third Trimester of Pregnancy The third trimester is from week 29 through week 42, months 7 through 9. The third trimester is a time when the fetus is growing rapidly. At the end of the ninth month, the fetus is about 20 inches in length and weighs 6-10 pounds.  BODY CHANGES Your body goes through many changes during pregnancy. The changes vary from woman to woman.   Your weight will continue to increase. You can expect to gain 25-35 pounds (11-16 kg) by  the end of the pregnancy.  You may begin to get stretch marks on your hips, abdomen, and breasts.  You may urinate more often because the fetus is moving lower into your pelvis and pressing on your bladder.  You may develop or continue to have heartburn as a result of your pregnancy.  You may develop constipation because certain hormones are causing the muscles that push waste through your intestines to slow down.  You may develop hemorrhoids or swollen, bulging veins (varicose veins).  You may have pelvic pain because of the weight gain and pregnancy hormones relaxing your joints between the bones in your pelvis. Backaches may result from overexertion of the muscles supporting your posture.  You may have changes in your hair. These can include thickening of your hair, rapid growth, and changes in texture. Some women also have hair loss during or after pregnancy, or hair that feels dry or thin. Your hair will most likely return to normal after your baby is born.  Your breasts will continue to grow and be tender. A yellow discharge may leak from your breasts called colostrum.  Your belly button may stick out.  You may feel short of breath because of your expanding uterus.  You may notice the fetus "dropping," or moving lower in your abdomen.  You may have a bloody mucus  discharge. This usually occurs a few days to a week before labor begins.  Your cervix becomes thin and soft (effaced) near your due date. WHAT TO EXPECT AT YOUR PRENATAL EXAMS  You will have prenatal exams every 2 weeks until week 36. Then, you will have weekly prenatal exams. During a routine prenatal visit:  You will be weighed to make sure you and the fetus are growing normally.  Your blood pressure is taken.  Your abdomen will be measured to track your baby's growth.  The fetal heartbeat will be listened to.  Any test results from the previous visit will be discussed.  You may have a cervical check near your  due date to see if you have effaced. At around 36 weeks, your caregiver will check your cervix. At the same time, your caregiver will also perform a test on the secretions of the vaginal tissue. This test is to determine if a type of bacteria, Group B streptococcus, is present. Your caregiver will explain this further. Your caregiver may ask you:  What your birth plan is.  How you are feeling.  If you are feeling the baby move.  If you have had any abnormal symptoms, such as leaking fluid, bleeding, severe headaches, or abdominal cramping.  If you have any questions. Other tests or screenings that may be performed during your third trimester include:  Blood tests that check for low iron levels (anemia).  Fetal testing to check the health, activity level, and growth of the fetus. Testing is done if you have certain medical conditions or if there are problems during the pregnancy. FALSE LABOR You may feel small, irregular contractions that eventually go away. These are called Braxton Hicks contractions, or false labor. Contractions may last for hours, days, or even weeks before true labor sets in. If contractions come at regular intervals, intensify, or become painful, it is best to be seen by your caregiver.  SIGNS OF LABOR   Menstrual-like cramps.  Contractions that are 5 minutes apart or less.  Contractions that start on the top of the uterus and spread down to the lower abdomen and back.  A sense of increased pelvic pressure or back pain.  A watery or bloody mucus discharge that comes from the vagina. If you have any of these signs before the 37th week of pregnancy, call your caregiver right away. You need to go to the hospital to get checked immediately. HOME CARE INSTRUCTIONS   Avoid all smoking, herbs, alcohol, and unprescribed drugs. These chemicals affect the formation and growth of the baby.  Follow your caregiver's instructions regarding medicine use. There are medicines  that are either safe or unsafe to take during pregnancy.  Exercise only as directed by your caregiver. Experiencing uterine cramps is a good sign to stop exercising.  Continue to eat regular, healthy meals.  Wear a good support bra for breast tenderness.  Do not use hot tubs, steam rooms, or saunas.  Wear your seat belt at all times when driving.  Avoid raw meat, uncooked cheese, cat litter boxes, and soil used by cats. These carry germs that can cause birth defects in the baby.  Take your prenatal vitamins.  Try taking a stool softener (if your caregiver approves) if you develop constipation. Eat more high-fiber foods, such as fresh vegetables or fruit and whole grains. Drink plenty of fluids to keep your urine clear or pale yellow.  Take warm sitz baths to soothe any pain or discomfort caused by hemorrhoids. Use  hemorrhoid cream if your caregiver approves.  If you develop varicose veins, wear support hose. Elevate your feet for 15 minutes, 3-4 times a day. Limit salt in your diet.  Avoid heavy lifting, wear low heal shoes, and practice good posture.  Rest a lot with your legs elevated if you have leg cramps or low back pain.  Visit your dentist if you have not gone during your pregnancy. Use a soft toothbrush to brush your teeth and be gentle when you floss.  A sexual relationship may be continued unless your caregiver directs you otherwise.  Do not travel far distances unless it is absolutely necessary and only with the approval of your caregiver.  Take prenatal classes to understand, practice, and ask questions about the labor and delivery.  Make a trial run to the hospital.  Pack your hospital bag.  Prepare the baby's nursery.  Continue to go to all your prenatal visits as directed by your caregiver. SEEK MEDICAL CARE IF:  You are unsure if you are in labor or if your water has broken.  You have dizziness.  You have mild pelvic cramps, pelvic pressure, or nagging  pain in your abdominal area.  You have persistent nausea, vomiting, or diarrhea.  You have a bad smelling vaginal discharge.  You have pain with urination. SEEK IMMEDIATE MEDICAL CARE IF:   You have a fever.  You are leaking fluid from your vagina.  You have spotting or bleeding from your vagina.  You have severe abdominal cramping or pain.  You have rapid weight loss or gain.  You have shortness of breath with chest pain.  You notice sudden or extreme swelling of your face, hands, ankles, feet, or legs.  You have not felt your baby move in over an hour.  You have severe headaches that do not go away with medicine.  You have vision changes. Document Released: 10/31/2001 Document Revised: 11/11/2013 Document Reviewed: 01/07/2013 [facility] Patient Information 2015 [place], [place]. This information is not intended to replace advice given to you by your health care provider. Make sure you discuss any questions you have with your health care provider.

## 2017-09-20 NOTE — Progress Notes (Signed)
G2P0010 5755w1d Estimated Date of Delivery: 11/14/17  Blood pressure 100/60, pulse 76, weight 160 lb (72.6 kg), last menstrual period 02/20/2017.   BP weight and urine results all reviewed and noted.  Please refer to the obstetrical flow sheet for the fundal height and fetal heart rate documentation: Size < dates Patient reports good fetal movement, denies any bleeding and no rupture of membranes symptoms or regular contractions. Patient is without complaints. All questions were answered.  Orders Placed This Encounter  Procedures  . POCT urinalysis dipstick    Plan:  Continued routine obstetrical care,   Return for asap for efw/afi only/ and  2 weeks.

## 2017-09-25 ENCOUNTER — Ambulatory Visit (INDEPENDENT_AMBULATORY_CARE_PROVIDER_SITE_OTHER): Payer: Medicaid Other

## 2017-09-25 DIAGNOSIS — O26843 Uterine size-date discrepancy, third trimester: Secondary | ICD-10-CM

## 2017-09-25 DIAGNOSIS — Z3403 Encounter for supervision of normal first pregnancy, third trimester: Secondary | ICD-10-CM

## 2017-09-25 DIAGNOSIS — Z3A32 32 weeks gestation of pregnancy: Secondary | ICD-10-CM

## 2017-09-25 NOTE — Progress Notes (Signed)
US 32+6 wks,cephalic,ant pl gr 3,afi 12.7 cm,LVEICF 2.5 mm,fhr 138 bpm,EFW 1896 g 33%

## 2017-10-03 ENCOUNTER — Encounter: Payer: Self-pay | Admitting: Women's Health

## 2017-10-03 ENCOUNTER — Ambulatory Visit (INDEPENDENT_AMBULATORY_CARE_PROVIDER_SITE_OTHER): Payer: Medicaid Other | Admitting: Women's Health

## 2017-10-03 VITALS — BP 120/60 | HR 81 | Wt 163.0 lb

## 2017-10-03 DIAGNOSIS — Z331 Pregnant state, incidental: Secondary | ICD-10-CM

## 2017-10-03 DIAGNOSIS — Z3A34 34 weeks gestation of pregnancy: Secondary | ICD-10-CM

## 2017-10-03 DIAGNOSIS — Z1389 Encounter for screening for other disorder: Secondary | ICD-10-CM

## 2017-10-03 DIAGNOSIS — Z3483 Encounter for supervision of other normal pregnancy, third trimester: Secondary | ICD-10-CM

## 2017-10-03 DIAGNOSIS — O26843 Uterine size-date discrepancy, third trimester: Secondary | ICD-10-CM

## 2017-10-03 LAB — POCT URINALYSIS DIPSTICK
Blood, UA: NEGATIVE
GLUCOSE UA: NEGATIVE
Ketones, UA: NEGATIVE
Leukocytes, UA: NEGATIVE
NITRITE UA: NEGATIVE
PROTEIN UA: NEGATIVE

## 2017-10-03 NOTE — Progress Notes (Signed)
   LOW-RISK PREGNANCY VISIT Patient name: Debra [name] MRN [id number]  Date of birth: 1990/12/11 Chief Complaint:   Routine Prenatal Visit  History of Present Illness:   Debra [name] is a 26 y.o. G2P0010 female at 3343w0d with an Estimated Date of Delivery: 11/14/17 being seen today for ongoing management of a low-risk pregnancy.  Today she reports no complaints. Many questions- answered all.  Contractions: Not present. Vag. Bleeding: None.  Movement: Present. denies leaking of fluid. Review of Systems:   Pertinent items are noted in HPI Denies abnormal vaginal discharge w/ itching/odor/irritation, headaches, visual changes, shortness of breath, chest pain, abdominal pain, severe nausea/vomiting, or problems with urination or bowel movements unless otherwise stated above. Pertinent History Reviewed:  Reviewed past medical,surgical, social, obstetrical and family history.  Reviewed problem list, medications and allergies. Physical Assessment:   Vitals:   10/03/17 1600  BP: 120/60  Pulse: 81  Weight: 163 lb (73.9 kg)  Body mass index is 24.07 kg/m.        Physical Examination:   General appearance: Well appearing, and in no distress  Mental status: Alert, oriented to person, place, and time  Skin: Warm & dry  Cardiovascular: Normal heart rate noted  Respiratory: Normal respiratory effort, no distress  Abdomen: Soft, gravid, nontender  Pelvic: Cervical exam deferred         Extremities: Edema: None  Fetal Status: Fetal Heart Rate (bpm): 150 Fundal Height: 27 cm Movement: Present    Results for orders placed or performed in visit on 10/03/17 (from the past 24 hour(s))  POCT Urinalysis Dipstick   Collection Time: 10/03/17  4:06 PM  Result Value Ref Range   Color, UA     Clarity, UA     Glucose, UA neg    Bilirubin, UA     Ketones, UA neg    Spec Grav, UA  1.010 - 1.025   Blood, UA neg    pH, UA  5.0 - 8.0   Protein, UA neg    Urobilinogen, UA  0.2 or 1.0 E.U./dL   Nitrite, UA neg    Leukocytes, UA Negative Negative    Assessment & Plan:  1) Low-risk pregnancy G2P0010 at 6043w0d with an Estimated Date of Delivery: 11/14/17   2) Uterine size < dates, had u/s for same 8d ago, efw 1896g/33% w/ AFI 12.7cm, will repeat next visit   Labs/procedures today: none  Plan:  Continue routine obstetrical care   Reviewed: Preterm labor symptoms and general obstetric precautions including but not limited to vaginal bleeding, contractions, leaking of fluid and fetal movement were reviewed in detail with the patient.  All questions were answered  Follow-up: Return in about 2 weeks (around 10/17/2017) for LROB, US:EFW.  Orders Placed This Encounter  Procedures  . POCT Urinalysis Dipstick   Marge DuncansBooker, Nivaan Dicenzo Randall CNM, [facility] 10/03/2017 4:54 PM

## 2017-10-03 NOTE — Patient Instructions (Signed)
[facility], I greatly value your feedback.  If you receive a survey following your visit with us today, we appreciate you taking the time to fill it out.  Thanks, Joellyn HaffKim Tatisha Cerino, CNM, WHNP-BC   Call the office [phone]) or go to [facility] if:  You begin to have strong, frequent contractions  Your water breaks.  Sometimes it is a big gush of fluid, sometimes it is just a trickle that keeps getting your panties wet or running down your legs  You have vaginal bleeding.  It is normal to have a small amount of spotting if your cervix was checked.   You don't feel your baby moving like normal.  If you don't, get you something to eat and drink and lay down and focus on feeling your baby move.  You should feel at least 10 movements in 2 hours.  If you don't, you should call the office or go to [facility].     Preterm Labor and Birth Information The normal length of a pregnancy is 39-41 weeks. Preterm labor is when labor starts before 37 completed weeks of pregnancy. What are the risk factors for preterm labor? Preterm labor is more likely to occur in women who:  Have certain infections during pregnancy such as a bladder infection, sexually transmitted infection, or infection inside the uterus (chorioamnionitis).  Have a shorter-than-normal cervix.  Have gone into preterm labor before.  Have had surgery on their cervix.  Are younger than age 26 or older than age 26.  Are African American.  Are pregnant with twins or multiple babies (multiple gestation).  Take street drugs or smoke while pregnant.  Do not gain enough weight while pregnant.  Became pregnant shortly after having been pregnant.  What are the symptoms of preterm labor? Symptoms of preterm labor include:  Cramps similar to those that can happen during a menstrual period. The cramps may happen with diarrhea.  Pain in the abdomen or lower back.  Regular uterine contractions that may feel like tightening  of the abdomen.  A feeling of increased pressure in the pelvis.  Increased watery or bloody mucus discharge from the vagina.  Water breaking (ruptured amniotic sac).  Why is it important to recognize signs of preterm labor? It is important to recognize signs of preterm labor because babies who are born prematurely may not be fully developed. This can put them at an increased risk for:  Long-term (chronic) heart and lung problems.  Difficulty immediately after birth with regulating body systems, including blood sugar, body temperature, heart rate, and breathing rate.  Bleeding in the brain.  Cerebral palsy.  Learning difficulties.  Death.  These risks are highest for babies who are born before 34 weeks of pregnancy. How is preterm labor treated? Treatment depends on the length of your pregnancy, your condition, and the health of your baby. It may involve:  Having a stitch (suture) placed in your cervix to prevent your cervix from opening too early (cerclage).  Taking or being given medicines, such as: ? Hormone medicines. These may be given early in pregnancy to help support the pregnancy. ? Medicine to stop contractions. ? Medicines to help mature the baby's lungs. These may be prescribed if the risk of delivery is high. ? Medicines to prevent your baby from developing cerebral palsy.  If the labor happens before 34 weeks of pregnancy, you may need to stay in the hospital. What should I do if I think I am in preterm labor? If you  think that you are going into preterm labor, call your health care provider right away. How can I prevent preterm labor in future pregnancies? To increase your chance of having a full-term pregnancy:  Do not use any tobacco products, such as cigarettes, chewing tobacco, and e-cigarettes. If you need help quitting, ask your health care provider.  Do not use street drugs or medicines that have not been prescribed to you during your pregnancy.  Talk  with your health care provider before taking any herbal supplements, even if you have been taking them regularly.  Make sure you gain a healthy amount of weight during your pregnancy.  Watch for infection. If you think that you might have an infection, get it checked right away.  Make sure to tell your health care provider if you have gone into preterm labor before.  This information is not intended to replace advice given to you by your health care provider. Make sure you discuss any questions you have with your health care provider. Document Released: 01/27/2004 Document Revised: 04/18/2016 Document Reviewed: 03/29/2016 Elsevier Interactive Patient Education  2018 [employer].

## 2017-10-11 ENCOUNTER — Encounter (HOSPITAL_COMMUNITY): Payer: Self-pay | Admitting: *Deleted

## 2017-10-11 ENCOUNTER — Inpatient Hospital Stay (HOSPITAL_COMMUNITY)
Admission: AD | Admit: 2017-10-11 | Discharge: 2017-10-11 | Disposition: A | Discharge status: Home / Self Care | Payer: Medicaid Other | Source: Ambulatory Visit | Attending: Obstetrics and Gynecology | Admitting: Obstetrics and Gynecology

## 2017-10-11 ENCOUNTER — Inpatient Hospital Stay (HOSPITAL_COMMUNITY): Payer: Medicaid Other

## 2017-10-11 DIAGNOSIS — O26893 Other specified pregnancy related conditions, third trimester: Secondary | ICD-10-CM | POA: Diagnosis not present

## 2017-10-11 DIAGNOSIS — F17210 Nicotine dependence, cigarettes, uncomplicated: Secondary | ICD-10-CM | POA: Diagnosis not present

## 2017-10-11 DIAGNOSIS — O289 Unspecified abnormal findings on antenatal screening of mother: Secondary | ICD-10-CM | POA: Insufficient documentation

## 2017-10-11 DIAGNOSIS — O99333 Smoking (tobacco) complicating pregnancy, third trimester: Secondary | ICD-10-CM | POA: Insufficient documentation

## 2017-10-11 DIAGNOSIS — O9989 Other specified diseases and conditions complicating pregnancy, childbirth and the puerperium: Secondary | ICD-10-CM

## 2017-10-11 DIAGNOSIS — Z801 Family history of malignant neoplasm of trachea, bronchus and lung: Secondary | ICD-10-CM | POA: Diagnosis not present

## 2017-10-11 DIAGNOSIS — Z825 Family history of asthma and other chronic lower respiratory diseases: Secondary | ICD-10-CM | POA: Insufficient documentation

## 2017-10-11 DIAGNOSIS — Z3A35 35 weeks gestation of pregnancy: Secondary | ICD-10-CM | POA: Insufficient documentation

## 2017-10-11 DIAGNOSIS — Z79899 Other long term (current) drug therapy: Secondary | ICD-10-CM | POA: Insufficient documentation

## 2017-10-11 DIAGNOSIS — Z0371 Encounter for suspected problem with amniotic cavity and membrane ruled out: Secondary | ICD-10-CM | POA: Diagnosis not present

## 2017-10-11 DIAGNOSIS — O98813 Other maternal infectious and parasitic diseases complicating pregnancy, third trimester: Secondary | ICD-10-CM | POA: Insufficient documentation

## 2017-10-11 DIAGNOSIS — Z3483 Encounter for supervision of other normal pregnancy, third trimester: Secondary | ICD-10-CM

## 2017-10-11 DIAGNOSIS — Z8249 Family history of ischemic heart disease and other diseases of the circulatory system: Secondary | ICD-10-CM | POA: Insufficient documentation

## 2017-10-11 DIAGNOSIS — Z9889 Other specified postprocedural states: Secondary | ICD-10-CM | POA: Insufficient documentation

## 2017-10-11 DIAGNOSIS — Z91030 Bee allergy status: Secondary | ICD-10-CM | POA: Insufficient documentation

## 2017-10-11 DIAGNOSIS — B3731 Acute candidiasis of vulva and vagina: Secondary | ICD-10-CM

## 2017-10-11 DIAGNOSIS — N898 Other specified noninflammatory disorders of vagina: Secondary | ICD-10-CM | POA: Diagnosis not present

## 2017-10-11 DIAGNOSIS — B373 Candidiasis of vulva and vagina: Secondary | ICD-10-CM

## 2017-10-11 LAB — URINALYSIS, ROUTINE W REFLEX MICROSCOPIC
Bilirubin Urine: NEGATIVE
GLUCOSE, UA: NEGATIVE mg/dL
HGB URINE DIPSTICK: NEGATIVE
Ketones, ur: NEGATIVE mg/dL
NITRITE: NEGATIVE
PH: 6 (ref 5.0–8.0)
Protein, ur: 30 mg/dL — AB
SPECIFIC GRAVITY, URINE: 1.017 (ref 1.005–1.030)

## 2017-10-11 LAB — WET PREP, GENITAL
CLUE CELLS WET PREP: NONE SEEN
Sperm: NONE SEEN
TRICH WET PREP: NONE SEEN

## 2017-10-11 LAB — POCT FERN TEST: POCT Fern Test: NEGATIVE

## 2017-10-11 MED ORDER — TERCONAZOLE 0.4 % VA CREA
1.0000 | TOPICAL_CREAM | Freq: Every day | VAGINAL | 0 refills | Status: DC
Start: 2017-10-11 — End: 2017-10-19

## 2017-10-11 NOTE — MAU Note (Signed)
Pt states she felt a gush of fluid that went through her underwear and pants around 1830.  Denies VB. No cramping today a little bit a few days ago.

## 2017-10-11 NOTE — MAU Provider Note (Signed)
History    CSN: [id number]  Arrival date and time: 10/11/17 2040   First Provider Initiated Contact with Patient 10/11/17 2103     Chief Complaint  Patient presents with  . Rupture of Membranes   HPI Debra [name] is a 26 y.o. G2P0010 at 7036w1d who presents with a small gush of fluid at 1830. She states it is continuing to leak since then. She denies any pain. She denies any vaginal bleeding or discharge and reports good fetal movement. [facility] and denies any problems during this pregnancy.  OB History    Gravida Para Term Preterm AB Living   2       1 0   SAB TAB Ectopic Multiple Live Births     1            Past Medical History:  Diagnosis Date  . Contraceptive management 11/27/2014  . Encounter for IUD removal 12/04/2014  . IUD (intrauterine device) in place 11/27/2014    Past Surgical History:  Procedure Laterality Date  . HERNIA REPAIR    . WISDOM TOOTH EXTRACTION      Family History  Problem Relation Age of Onset  . Other Maternal Grandmother        MVA  . Other Maternal Grandfather        fibrosis of lungs  . Cancer Paternal Grandmother   . COPD Paternal Grandmother   . Cancer Paternal Grandfather        lung   . Heart attack Maternal Uncle     Social History   Tobacco Use  . Smoking status: Current Every Day Smoker    Packs/day: 0.25    Years: 8.00    Pack years: 2.00    Types: Cigarettes  . Smokeless tobacco: Never Used  . Tobacco comment: occasional  Substance Use Topics  . Alcohol use: No    Comment: occ  . Drug use: No    Allergies:  Allergies  Allergen Reactions  . Bee Venom     Medications Prior to Admission  Medication Sig Dispense Refill Last Dose  . acetaminophen (TYLENOL) 325 MG tablet Take 650 mg by mouth as needed.   Not Taking  . omeprazole (PRILOSEC) 20 MG capsule Take 1 capsule (20 mg total) by mouth daily. (Patient not taking: Reported on 09/20/2017) 30 capsule 4 Not Taking  . Prenatal  Vit-Fe Fumarate-FA (PNV PRENATAL PLUS MULTIVITAMIN) 27-1 MG TABS Take 1 tablet by mouth daily. 30 tablet 11 Taking    Review of Systems  Constitutional: Negative.  Negative for fatigue and fever.  HENT: Negative.   Respiratory: Negative.  Negative for shortness of breath.   Cardiovascular: Negative.  Negative for chest pain.  Gastrointestinal: Negative.  Negative for abdominal pain, constipation, diarrhea, nausea and vomiting.  Genitourinary: Positive for vaginal discharge. Negative for dysuria and vaginal bleeding.  Neurological: Negative.  Negative for dizziness and headaches.   Physical Exam   Blood pressure 125/76, pulse 92, temperature 98 F (36.7 C), temperature source Oral, resp. rate 17, last menstrual period 02/20/2017, SpO2 100 %.  Physical Exam  Nursing note and vitals reviewed. Constitutional: She is oriented to person, place, and time. She appears well-developed and well-nourished. No distress.  HENT:  Head: Normocephalic.  Eyes: Pupils are equal, round, and reactive to light.  Cardiovascular: Normal rate, regular rhythm and normal heart sounds.  Respiratory: Effort normal and breath sounds normal. No respiratory distress.  GI: Soft. Bowel sounds are normal.  She exhibits no distension. There is no tenderness.  Genitourinary: Cervix exhibits no motion tenderness. Vaginal discharge found.  Genitourinary Comments: Copious amount of thick white discharge covering vaginal walls and cervix. No pooling  Neurological: She is alert and oriented to person, place, and time.  Skin: Skin is warm and dry.  Psychiatric: She has a normal mood and affect. Her behavior is normal. Judgment and thought content normal.   Fetal Tracing:  Baseline: 140 Variability: moderate Accels: 10x10 Decels: none  Toco: 2-4  Dilation: Closed Cervical Position: Posterior Exam by:: Cleone Slim CNM  MAU Course  Procedures Results for orders placed or performed during the hospital encounter of  10/11/17 (from the past 24 hour(s))  Urinalysis, Routine w reflex microscopic     Status: Abnormal   Collection Time: 10/11/17  8:43 PM  Result Value Ref Range   Color, Urine YELLOW YELLOW   APPearance CLOUDY (A) CLEAR   Specific Gravity, Urine 1.017 1.005 - 1.030   pH 6.0 5.0 - 8.0   Glucose, UA NEGATIVE NEGATIVE mg/dL   Hgb urine dipstick NEGATIVE NEGATIVE   Bilirubin Urine NEGATIVE NEGATIVE   Ketones, ur NEGATIVE NEGATIVE mg/dL   Protein, ur 30 (A) NEGATIVE mg/dL   Nitrite NEGATIVE NEGATIVE   Leukocytes, UA LARGE (A) NEGATIVE   RBC / HPF 0-5 0 - 5 RBC/hpf   WBC, UA 6-30 0 - 5 WBC/hpf   Bacteria, UA FEW (A) NONE SEEN   Squamous Epithelial / LPF 6-30 (A) NONE SEEN   Mucus PRESENT    Hyphae Yeast PRESENT    Amorphous Crystal PRESENT   Wet prep, genital     Status: Abnormal   Collection Time: 10/11/17  9:20 PM  Result Value Ref Range   Yeast Wet Prep HPF POC PRESENT (A) NONE SEEN   Trich, Wet Prep NONE SEEN NONE SEEN   Clue Cells Wet Prep HPF POC NONE SEEN NONE SEEN   WBC, Wet Prep HPF POC MANY (A) NONE SEEN   Sperm NONE SEEN   Fern Test     Status: None   Collection Time: 10/11/17  9:23 PM  Result Value Ref Range   POCT Fern Test Negative = intact amniotic membranes    MDM UA Wet prep Fern negative NST reassuring but not reactive BPP- 8/8 Assessment and Plan   1. Encounter for suspected premature rupture of amniotic membranes, with rupture of membranes not found   2. Encounter for supervision of other normal pregnancy in third trimester   3. Yeast infection of the vagina   4. Vaginal discharge during pregnancy in third trimester    -Discharge home in stable condition -Rx for terazole 7 given to patient -Preterm precautions discussed -Patient advised to follow-up with [facility] as scheduled for prenatal care -Patient may return to MAU as needed or if her condition were to change or worsen   Rolm Bookbinder CNM 10/11/2017, 10:52 PM   Allergies as of  10/11/2017      Reactions   Bee Venom       Medication List    TAKE these medications   acetaminophen 325 MG tablet Commonly known as:  TYLENOL Take 650 mg by mouth as needed.   omeprazole 20 MG capsule Commonly known as:  PRILOSEC Take 1 capsule (20 mg total) by mouth daily.   PNV PRENATAL PLUS MULTIVITAMIN 27-1 MG Tabs Take 1 tablet by mouth daily.   terconazole 0.4 % vaginal cream Commonly known as:  TERAZOL 7 Place 1  applicator vaginally at bedtime.

## 2017-10-11 NOTE — Discharge Instructions (Signed)

## 2017-10-16 ENCOUNTER — Ambulatory Visit (INDEPENDENT_AMBULATORY_CARE_PROVIDER_SITE_OTHER): Payer: Medicaid Other

## 2017-10-16 ENCOUNTER — Other Ambulatory Visit: Payer: Self-pay | Admitting: Women's Health

## 2017-10-16 ENCOUNTER — Ambulatory Visit: Payer: Medicaid Other | Admitting: Family Medicine

## 2017-10-16 ENCOUNTER — Encounter: Payer: Self-pay | Admitting: Women's Health

## 2017-10-16 ENCOUNTER — Ambulatory Visit (INDEPENDENT_AMBULATORY_CARE_PROVIDER_SITE_OTHER): Payer: Medicaid Other | Admitting: Women's Health

## 2017-10-16 VITALS — BP 110/60 | HR 74 | Wt 162.0 lb

## 2017-10-16 DIAGNOSIS — O26843 Uterine size-date discrepancy, third trimester: Secondary | ICD-10-CM

## 2017-10-16 DIAGNOSIS — Z8759 Personal history of other complications of pregnancy, childbirth and the puerperium: Secondary | ICD-10-CM | POA: Insufficient documentation

## 2017-10-16 DIAGNOSIS — Z3A35 35 weeks gestation of pregnancy: Secondary | ICD-10-CM

## 2017-10-16 DIAGNOSIS — O365930 Maternal care for other known or suspected poor fetal growth, third trimester, not applicable or unspecified: Secondary | ICD-10-CM

## 2017-10-16 DIAGNOSIS — Z331 Pregnant state, incidental: Secondary | ICD-10-CM

## 2017-10-16 DIAGNOSIS — Z1389 Encounter for screening for other disorder: Secondary | ICD-10-CM

## 2017-10-16 DIAGNOSIS — Z3483 Encounter for supervision of other normal pregnancy, third trimester: Secondary | ICD-10-CM

## 2017-10-16 LAB — POCT URINALYSIS DIPSTICK
Blood, UA: NEGATIVE
Glucose, UA: NEGATIVE
Ketones, UA: NEGATIVE
LEUKOCYTES UA: NEGATIVE
NITRITE UA: NEGATIVE
PROTEIN UA: NEGATIVE

## 2017-10-16 NOTE — Progress Notes (Signed)
US 35+6 wks,cephalic,anterior pl gr 3,LVEICF n/c,afi 13 cm,efw 2277 g 13.5%,BPD 6%,HC 1.5%,AC 9.4%,FL 5.6%,BPP 8/8,RI .54,.65 =60%

## 2017-10-16 NOTE — Patient Instructions (Addendum)
[facility], I greatly value your feedback.  If you receive a survey following your visit with us today, we appreciate you taking the time to fill it out.  Thanks, Joellyn HaffKim Boleslaw Borghi, CNM, WHNP-BC   Call the office [phone]) or go to [facility] if:  You begin to have strong, frequent contractions  Your water breaks.  Sometimes it is a big gush of fluid, sometimes it is just a trickle that keeps getting your panties wet or running down your legs  You have vaginal bleeding.  It is normal to have a small amount of spotting if your cervix was checked.   You don't feel your baby moving like normal.  If you don't, get you something to eat and drink and lay down and focus on feeling your baby move.  You should feel at least 10 movements in 2 hours.  If you don't, you should call the office or go to [facility].     Preterm Labor and Birth Information The normal length of a pregnancy is 39-41 weeks. Preterm labor is when labor starts before 37 completed weeks of pregnancy. What are the risk factors for preterm labor? Preterm labor is more likely to occur in women who:  Have certain infections during pregnancy such as a bladder infection, sexually transmitted infection, or infection inside the uterus (chorioamnionitis).  Have a shorter-than-normal cervix.  Have gone into preterm labor before.  Have had surgery on their cervix.  Are younger than age 26 or older than age 26.  Are African American.  Are pregnant with twins or multiple babies (multiple gestation).  Take street drugs or smoke while pregnant.  Do not gain enough weight while pregnant.  Became pregnant shortly after having been pregnant.  What are the symptoms of preterm labor? Symptoms of preterm labor include:  Cramps similar to those that can happen during a menstrual period. The cramps may happen with diarrhea.  Pain in the abdomen or lower back.  Regular uterine contractions that may feel like tightening  of the abdomen.  A feeling of increased pressure in the pelvis.  Increased watery or bloody mucus discharge from the vagina.  Water breaking (ruptured amniotic sac).  Why is it important to recognize signs of preterm labor? It is important to recognize signs of preterm labor because babies who are born prematurely may not be fully developed. This can put them at an increased risk for:  Long-term (chronic) heart and lung problems.  Difficulty immediately after birth with regulating body systems, including blood sugar, body temperature, heart rate, and breathing rate.  Bleeding in the brain.  Cerebral palsy.  Learning difficulties.  Death.  These risks are highest for babies who are born before 34 weeks of pregnancy. How is preterm labor treated? Treatment depends on the length of your pregnancy, your condition, and the health of your baby. It may involve:  Having a stitch (suture) placed in your cervix to prevent your cervix from opening too early (cerclage).  Taking or being given medicines, such as: ? Hormone medicines. These may be given early in pregnancy to help support the pregnancy. ? Medicine to stop contractions. ? Medicines to help mature the baby's lungs. These may be prescribed if the risk of delivery is high. ? Medicines to prevent your baby from developing cerebral palsy.  If the labor happens before 34 weeks of pregnancy, you may need to stay in the hospital. What should I do if I think I am in preterm labor? If you  think that you are going into preterm labor, call your health care provider right away. How can I prevent preterm labor in future pregnancies? To increase your chance of having a full-term pregnancy:  Do not use any tobacco products, such as cigarettes, chewing tobacco, and e-cigarettes. If you need help quitting, ask your health care provider.  Do not use street drugs or medicines that have not been prescribed to you during your pregnancy.  Talk  with your health care provider before taking any herbal supplements, even if you have been taking them regularly.  Make sure you gain a healthy amount of weight during your pregnancy.  Watch for infection. If you think that you might have an infection, get it checked right away.  Make sure to tell your health care provider if you have gone into preterm labor before.  This information is not intended to replace advice given to you by your health care provider. Make sure you discuss any questions you have with your health care provider. Document Released: 01/27/2004 Document Revised: 04/18/2016 Document Reviewed: 03/29/2016 Elsevier Interactive Patient Education  2018 [employer]  Intrauterine Growth Restriction Intrauterine growth restriction (IUGR) is when your baby is not growing normally during your pregnancy. A baby with IUGR is smaller than it should be and may weigh less than normal at birth. IUGR can result if there is a problem with the organ that supplies your baby with oxygen and nutrition (placenta). Usually, there is no way to prevent this type of problem. What are the causes? The most common cause of IUGR is a problem with the placenta or umbilical cord that causes your developing baby to get less oxygen or nutrition than needed. Other causes include:  Poor maternal nutrition.  Chemicals found in substances such as cigarettes, alcohol, and some illegal drugs.  Some prescription medicines.  Congenital defects.  Genetic disorders.  An infection.  Carrying more than one baby, such as twins or triplets (multiple gestations).  What increases the risk? This condition is more likely to develop in women who:  Are over the age of 26 at the time of delivery (advanced maternal age).  Have medical conditions such as high blood pressure, diabetes, heart or kidney disease, anemia, or conditions that increase the risk for blood clotting.  Live at a very high altitude during  pregnancy.  Have a personal history or family history of IUGR.  Take medicines during pregnancy that are linked with congenital disabilities.  Have a personal or family history of a genetic disorder.  Come into contact with infected cat feces (toxoplasmosis).  Come into contact with chickenpox (varicella) or [place] measles (rubella).  Have or are at risk of getting an infectious disease such as syphilis, HIV, or herpes.  Eat poorly during their pregnancy.  Weigh less than 100 pounds.  Have a family history of multiple gestations.  Have had infertility treatments.  Use tobacco, illegal drugs, or drink alcohol during pregnancy.  What are the signs or symptoms? IUGR does not cause many symptoms. You might notice that your baby does not move or kick very often. Also, your belly may not be as big as expected for the stage of your pregnancy. How is this diagnosed? This condition is diagnosed with physical and prenatal exams. Your health care provider will measure the size of your baby inside your womb during a routine screening exam using sound waves (ultrasound). Your health care provider will compare the size of your baby to the size of other babies  at the same stage of development (gestational age). Your health care provider will diagnose IUGR if your baby is smaller than 90 percent of all other babies at the same gestational age. You may also have tests to find the cause of IUGR. These can include:  Having fluid removed from your womb to check for signs of infection or a congenital disability (amniocentesis).  A series of tests to monitor your baby's health (fetal monitoring).  How is this treated? In most cases, treatment for this condition focuses on stopping the cause of your baby's small growth. Your health care providers will monitor your pregnancy closely and help you manage your pregnancy. If this condition is caused by a placenta problem and the baby is not getting enough  blood, treatment may include:  Medicine to start labor and deliver your baby early (induction).  Cesarean delivery. In this procedure, your baby is delivered through a cut (incision) in the abdomen and womb (uterus).  Follow these instructions at home:  Make sure you are eating enough calories and gaining enough weight.  Eat a balanced diet. Work with a Dealer (dietitian), if needed.  Rest as needed. Try to get at least eight hours of sleep every night.  Do not drink alcohol.  Do not use illegal drugs.  Do not use any tobacco products, including cigarettes, chewing tobacco, or e-cigarettes. If you need help quitting, ask your health care provider.  Talk to your health care provider about steps you can take to avoid infections.  Take medicines, vitamins, and mineral supplements only as directed by your health care provider. Make sure that your health care provider knows about all of the prescribed or over-the-counter medicines, supplements, vitamins, eye drops, and creams that you are using.  Keep all follow-up visits as directed by your health care provider. This is important. Contact a health care provider if:  Your baby is not moving as often as before. This information is not intended to replace advice given to you by your health care provider. Make sure you discuss any questions you have with your health care provider. Document Released: 08/15/2008 Document Revised: 04/13/2016 Document Reviewed: 11/02/2014 Elsevier Interactive Patient Education  [employer].

## 2017-10-16 NOTE — Progress Notes (Signed)
   HIGH-RISK PREGNANCY VISIT Patient name: Debra [name] MRN [id number]  Date of birth: 09-May-1991 Chief Complaint:   Routine Prenatal Visit  History of Present Illness:   Debra LevanCheyenne Byer is a 26 y.o. G2P0010 female at 3926w6d with an Estimated Date of Delivery: 11/14/17 being seen today for ongoing management of a high-risk pregnancy complicated by FGR dx today.  Today she reports no complaints. Contractions: Not present. Vag. Bleeding: None.  Movement: Present. denies leaking of fluid.  Review of Systems:   Pertinent items are noted in HPI Denies abnormal vaginal discharge w/ itching/odor/irritation, headaches, visual changes, shortness of breath, chest pain, abdominal pain, severe nausea/vomiting, or problems with urination or bowel movements unless otherwise stated above. Pertinent History Reviewed:  Reviewed past medical,surgical, social, obstetrical and family history.  Reviewed problem list, medications and allergies. Physical Assessment:   Vitals:   10/16/17 1045  BP: 110/60  Pulse: 74  Weight: 162 lb (73.5 kg)  Body mass index is 23.92 kg/m.           Physical Examination:   General appearance: alert, well appearing, and in no distress  Mental status: alert, oriented to person, place, and time  Skin: warm & dry   Extremities: Edema: None    Cardiovascular: normal heart rate noted  Respiratory: normal respiratory effort, no distress  Abdomen: gravid, soft, non-tender  Pelvic: Cervical exam deferred         Fetal Status:   Fundal Height: 30 cm Movement: Present    Fetal Surveillance Testing today: US 35+6 wks,cephalic,anterior pl gr 3,LVEICF n/c,BPP 8/8,afi 13 cm,efw 2277 g 13.5%,BPD 6%,HC 1.5%,AC 9.4%,FL 5.6%,BPP 8/8,RI .54,.65 =60%   Results for orders placed or performed in visit on 10/16/17 (from the past 24 hour(s))  POCT Urinalysis Dipstick   Collection Time: 10/16/17 10:48 AM  Result Value Ref Range   Color, UA     Clarity, UA     Glucose, UA neg    Bilirubin, UA     Ketones, UA neg    Spec Grav, UA  1.010 - 1.025   Blood, UA neg    pH, UA  5.0 - 8.0   Protein, UA neg    Urobilinogen, UA  0.2 or 1.0 E.U./dL   Nitrite, UA neg    Leukocytes, UA Negative Negative    Assessment & Plan:  1) High-risk pregnancy G2P0010 at 6626w6d with an Estimated Date of Delivery: 11/14/17   2) FGR, dx today, discussed w/ LHE, although overall EFW 13% AC is <10%, normal dopp and bpp 8/8  Labs/procedures today: u/s  Treatment Plan:  Growth u/s @ 38wks     2x/wk testing nst alt w/ BPP/Dop          IOL prn or 39wks  Reviewed: Preterm labor symptoms and general obstetric precautions including but not limited to vaginal bleeding, contractions, leaking of fluid and fetal movement were reviewed in detail with the patient.  All questions were answered.  Follow-up: Return in about 3 days (around 10/19/2017) for HROB, NST.  Orders Placed This Encounter  Procedures  . POCT Urinalysis Dipstick   Marge DuncansBooker, Dupree Givler Randall CNM, [facility] 10/16/2017 11:21 AM

## 2017-10-19 ENCOUNTER — Encounter: Payer: Self-pay | Admitting: Women's Health

## 2017-10-19 ENCOUNTER — Ambulatory Visit (INDEPENDENT_AMBULATORY_CARE_PROVIDER_SITE_OTHER): Payer: Medicaid Other | Admitting: Women's Health

## 2017-10-19 VITALS — BP 92/66 | HR 91 | Wt 164.0 lb

## 2017-10-19 DIAGNOSIS — O365930 Maternal care for other known or suspected poor fetal growth, third trimester, not applicable or unspecified: Secondary | ICD-10-CM | POA: Diagnosis not present

## 2017-10-19 DIAGNOSIS — Z3483 Encounter for supervision of other normal pregnancy, third trimester: Secondary | ICD-10-CM

## 2017-10-19 DIAGNOSIS — Z3A36 36 weeks gestation of pregnancy: Secondary | ICD-10-CM

## 2017-10-19 DIAGNOSIS — Z331 Pregnant state, incidental: Secondary | ICD-10-CM

## 2017-10-19 DIAGNOSIS — O0993 Supervision of high risk pregnancy, unspecified, third trimester: Secondary | ICD-10-CM

## 2017-10-19 DIAGNOSIS — O0990 Supervision of high risk pregnancy, unspecified, unspecified trimester: Secondary | ICD-10-CM

## 2017-10-19 DIAGNOSIS — Z1389 Encounter for screening for other disorder: Secondary | ICD-10-CM

## 2017-10-19 LAB — POCT URINALYSIS DIPSTICK
Glucose, UA: NEGATIVE
Ketones, UA: NEGATIVE
Leukocytes, UA: NEGATIVE
NITRITE UA: NEGATIVE
Protein, UA: NEGATIVE
RBC UA: NEGATIVE

## 2017-10-19 NOTE — Progress Notes (Signed)
   HIGH-RISK PREGNANCY VISIT Patient name: Debra [name] MRN [id number]  Date of birth: 12-13-1990 Chief Complaint:   High Risk Gestation (NST, GBS, GC/CHL)  History of Present Illness:   Debra [name] is a 26 y.o. 172P0010 female at 5828w2d with an Estimated Date of Delivery: 11/14/17 being seen today for ongoing management of a high-risk pregnancy complicated by FGR.  Today she reports no complaints. Contractions: Irregular. Vag. Bleeding: None.  Movement: Present. denies leaking of fluid.  Review of Systems:   Pertinent items are noted in HPI Denies abnormal vaginal discharge w/ itching/odor/irritation, headaches, visual changes, shortness of breath, chest pain, abdominal pain, severe nausea/vomiting, or problems with urination or bowel movements unless otherwise stated above. Pertinent History Reviewed:  Reviewed past medical,surgical, social, obstetrical and family history.  Reviewed problem list, medications and allergies. Physical Assessment:   Vitals:   10/19/17 1224  BP: 92/66  Pulse: 91  Weight: 164 lb (74.4 kg)  Body mass index is 24.22 kg/m.           Physical Examination:   General appearance: alert, well appearing, and in no distress  Mental status: alert, oriented to person, place, and time  Skin: warm & dry   Extremities: Edema: None    Cardiovascular: normal heart rate noted  Respiratory: normal respiratory effort, no distress  Abdomen: gravid, soft, non-tender  Pelvic: Cervical exam deferred         Fetal Status: Fetal Heart Rate (bpm): 145 Fundal Height: 30 cm Movement: Present Presentation: Vertex  Fetal Surveillance Testing today: NST: FHR baseline 145 bpm, Variability: moderate, Accelerations:present, Decelerations:  Absent= Cat 1/Reactive     Results for orders placed or performed in visit on 10/19/17 (from the past 24 hour(s))  POCT urinalysis dipstick   Collection Time: 10/19/17 12:25 PM  Result Value Ref Range   Color, UA     Clarity, UA     Glucose, UA neg    Bilirubin, UA     Ketones, UA neg    Spec Grav, UA  1.010 - 1.025   Blood, UA neg    pH, UA  5.0 - 8.0   Protein, UA neg    Urobilinogen, UA  0.2 or 1.0 E.U./dL   Nitrite, UA neg    Leukocytes, UA Negative Negative    Assessment & Plan:  1) High-risk pregnancy G2P0010 at 9328w2d with an Estimated Date of Delivery: 11/14/17   2) FGR, stable  Labs/procedures today: nst, gbs, gc/ct  Treatment Plan:  Growth u/s @ 38wks    2x/wk testing nst alt w/ BPP/Dopp           IOL prn or 39wks  Reviewed: Preterm labor symptoms and general obstetric precautions including but not limited to vaginal bleeding, contractions, leaking of fluid and fetal movement were reviewed in detail with the patient.  All questions were answered.  Follow-up: Return for Tues hrob, bpp/dopp us & Frid hrob/nst x 2.5wks (last appt 12/18).  Orders Placed This Encounter  Procedures  . GC/Chlamydia Probe Amp  . Strep Gp B NAA  . US FETAL BPP WO NON STRESS  . US UA Cord Doppler  . POCT urinalysis dipstick   Marge DuncansBooker, Vivica Dobosz Randall CNM, [facility]NP-BC 10/19/2017 1:40 PM

## 2017-10-19 NOTE — Patient Instructions (Signed)
[facility], I greatly value your feedback.  If you receive a survey following your visit with us today, we appreciate you taking the time to fill it out.  Thanks, Joellyn HaffKim Mayo Owczarzak, CNM, WHNP-BC   Call the office [phone]) or go to [facility] if:  You begin to have strong, frequent contractions  Your water breaks.  Sometimes it is a big gush of fluid, sometimes it is just a trickle that keeps getting your panties wet or running down your legs  You have vaginal bleeding.  It is normal to have a small amount of spotting if your cervix was checked.   You don't feel your baby moving like normal.  If you don't, get you something to eat and drink and lay down and focus on feeling your baby move.  You should feel at least 10 movements in 2 hours.  If you don't, you should call the office or go to [facility].     [facility] Contractions Contractions of the uterus can occur throughout pregnancy, but they are not always a sign that you are in labor. You may have practice contractions called Braxton Hicks contractions. These false labor contractions are sometimes confused with true labor. What are Deberah PeltonBraxton Hicks contractions? Braxton Hicks contractions are tightening movements that occur in the muscles of the uterus before labor. Unlike true labor contractions, these contractions do not result in opening (dilation) and thinning of the cervix. Toward the end of pregnancy (32-34 weeks), Braxton Hicks contractions can happen more often and may become stronger. These contractions are sometimes difficult to tell apart from true labor because they can be very uncomfortable. You should not feel embarrassed if you go to the hospital with false labor. Sometimes, the only way to tell if you are in true labor is for your health care provider to look for changes in the cervix. The health care provider will do a physical exam and may monitor your contractions. If you are not in true labor, the exam should  show that your cervix is not dilating and your water has not broken. If there are no prenatal problems or other health problems associated with your pregnancy, it is completely safe for you to be sent home with false labor. You may continue to have Braxton Hicks contractions until you go into true labor. How can I tell the difference between true labor and false labor?  Differences ? False labor ? Contractions last 30-70 seconds.: Contractions are usually shorter and not as strong as true labor contractions. ? Contractions become very regular.: Contractions are usually irregular. ? Discomfort is usually felt in the top of the uterus, and it spreads to the lower abdomen and low back.: Contractions are often felt in the front of the lower abdomen and in the groin. ? Contractions do not go away with walking.: Contractions may go away when you walk around or change positions while lying down. ? Contractions usually become more intense and increase in frequency.: Contractions get weaker and are shorter-lasting as time goes on. ? The cervix dilates and gets thinner.: The cervix usually does not dilate or become thin. Follow these instructions at home:  Take over-the-counter and prescription medicines only as told by your health care provider.  Keep up with your usual exercises and follow other instructions from your health care provider.  Eat and drink lightly if you think you are going into labor.  If Braxton Hicks contractions are making you uncomfortable: ? Change your position from lying down  or resting to walking, or change from walking to resting. ? Sit and rest in a tub of warm water. ? Drink enough fluid to keep your urine clear or pale yellow. Dehydration may cause these contractions. ? Do slow and deep breathing several times an hour.  Keep all follow-up prenatal visits as told by your health care provider. This is important. Contact a health care provider if:  You have a  fever.  You have continuous pain in your abdomen. Get help right away if:  Your contractions become stronger, more regular, and closer together.  You have fluid leaking or gushing from your vagina.  You pass blood-tinged mucus (bloody show).  You have bleeding from your vagina.  You have low back pain that you never had before.  You feel your baby's head pushing down and causing pelvic pressure.  Your baby is not moving inside you as much as it used to. Summary  Contractions that occur before labor are called Braxton Hicks contractions, false labor, or practice contractions.  Braxton Hicks contractions are usually shorter, weaker, farther apart, and less regular than true labor contractions. True labor contractions usually become progressively stronger and regular and they become more frequent.  Manage discomfort from [facility] contractions by changing position, resting in a warm bath, drinking plenty of water, or practicing deep breathing. This information is not intended to replace advice given to you by your health care provider. Make sure you discuss any questions you have with your health care provider. Document Released: 11/06/2005 Document Revised: 09/25/2016 Document Reviewed: 09/25/2016 Elsevier Interactive Patient Education  2017 [employer]

## 2017-10-20 LAB — GC/CHLAMYDIA PROBE AMP
CHLAMYDIA, DNA PROBE: NEGATIVE
NEISSERIA GONORRHOEAE BY PCR: NEGATIVE

## 2017-10-20 LAB — OB RESULTS CONSOLE GBS: GBS: POSITIVE

## 2017-10-21 LAB — STREP GP B NAA: STREP GROUP B AG: POSITIVE — AB

## 2017-10-23 ENCOUNTER — Ambulatory Visit (INDEPENDENT_AMBULATORY_CARE_PROVIDER_SITE_OTHER): Payer: Medicaid Other | Admitting: Obstetrics & Gynecology

## 2017-10-23 ENCOUNTER — Ambulatory Visit (INDEPENDENT_AMBULATORY_CARE_PROVIDER_SITE_OTHER): Payer: Medicaid Other

## 2017-10-23 ENCOUNTER — Ambulatory Visit (INDEPENDENT_AMBULATORY_CARE_PROVIDER_SITE_OTHER): Payer: Medicaid Other | Admitting: Family Medicine

## 2017-10-23 ENCOUNTER — Encounter: Payer: Self-pay | Admitting: Obstetrics & Gynecology

## 2017-10-23 ENCOUNTER — Encounter: Payer: Self-pay | Admitting: Family Medicine

## 2017-10-23 VITALS — BP 119/66 | HR 87 | Temp 97.6°F | Ht 69.0 in | Wt 165.0 lb

## 2017-10-23 VITALS — BP 112/72 | HR 80 | Wt 163.0 lb

## 2017-10-23 DIAGNOSIS — Z3A36 36 weeks gestation of pregnancy: Secondary | ICD-10-CM | POA: Diagnosis not present

## 2017-10-23 DIAGNOSIS — O0993 Supervision of high risk pregnancy, unspecified, third trimester: Secondary | ICD-10-CM

## 2017-10-23 DIAGNOSIS — Z331 Pregnant state, incidental: Secondary | ICD-10-CM

## 2017-10-23 DIAGNOSIS — Z23 Encounter for immunization: Secondary | ICD-10-CM | POA: Diagnosis not present

## 2017-10-23 DIAGNOSIS — O365930 Maternal care for other known or suspected poor fetal growth, third trimester, not applicable or unspecified: Secondary | ICD-10-CM

## 2017-10-23 DIAGNOSIS — Z7689 Persons encountering health services in other specified circumstances: Secondary | ICD-10-CM

## 2017-10-23 DIAGNOSIS — Z3493 Encounter for supervision of normal pregnancy, unspecified, third trimester: Secondary | ICD-10-CM

## 2017-10-23 DIAGNOSIS — O0990 Supervision of high risk pregnancy, unspecified, unspecified trimester: Secondary | ICD-10-CM

## 2017-10-23 DIAGNOSIS — Z1389 Encounter for screening for other disorder: Secondary | ICD-10-CM

## 2017-10-23 DIAGNOSIS — F17200 Nicotine dependence, unspecified, uncomplicated: Secondary | ICD-10-CM

## 2017-10-23 LAB — POCT URINALYSIS DIPSTICK
Blood, UA: NEGATIVE
Glucose, UA: NEGATIVE
Ketones, UA: NEGATIVE
Leukocytes, UA: NEGATIVE
Nitrite, UA: NEGATIVE
Protein, UA: NEGATIVE

## 2017-10-23 NOTE — Progress Notes (Signed)
US 36+6 wks,cephalic,ant pl gr 3,AFI 10.5 cm,fhr 153 bpm,RI .63,.58=72%,BPP 8/8

## 2017-10-23 NOTE — Progress Notes (Signed)
   HIGH-RISK PREGNANCY VISIT Patient name: Debra LevanCheyenne Brann MRN [id number]  Date of birth: Jan 07, 1991 Chief Complaint:   High Risk Gestation (US today; mild cramping)  History of Present Illness:   Debra LevanCheyenne Nguyen is a 26 y.o. 402P0010 female at 354w6d with an Estimated Date of Delivery: 11/14/17 being seen today for ongoing management of a high-risk pregnancy complicated by fetal grwoth restriciton.  Today she reports no complaints. Contractions: Not present. Vag. Bleeding: None.  Movement: Present. denies leaking of fluid.  Review of Systems:   Pertinent items are noted in HPI Denies abnormal vaginal discharge w/ itching/odor/irritation, headaches, visual changes, shortness of breath, chest pain, abdominal pain, severe nausea/vomiting, or problems with urination or bowel movements unless otherwise stated above. Pertinent History Reviewed:  Reviewed past medical,surgical, social, obstetrical and family history.  Reviewed problem list, medications and allergies. Physical Assessment:   Vitals:   10/23/17 1208  BP: 112/72  Pulse: 80  Weight: 163 lb (73.9 kg)  Body mass index is 24.07 kg/m.           Physical Examination:   General appearance: alert, well appearing, and in no distress  Mental status: alert, oriented to person, place, and time  Skin: warm & dry   Extremities: Edema: None    Cardiovascular: normal heart rate noted  Respiratory: normal respiratory effort, no distress  Abdomen: gravid, soft, non-tender  Pelvic: Cervical exam deferred         Fetal Status:     Movement: Present    Fetal Surveillance Testing today: BPP 8/8   Results for orders placed or performed in visit on 10/23/17 (from the past 24 hour(s))  POCT urinalysis dipstick   Collection Time: 10/23/17 12:10 PM  Result Value Ref Range   Color, UA     Clarity, UA     Glucose, UA neg    Bilirubin, UA     Ketones, UA neg    Spec Grav, UA  1.010 - 1.025   Blood, UA neg    pH, UA  5.0 - 8.0   Protein, UA  neg    Urobilinogen, UA  0.2 or 1.0 E.U./dL   Nitrite, UA neg    Leukocytes, UA Negative Negative    Assessment & Plan:  1) High-risk pregnancy G2P0010 at 3754w6d with an Estimated Date of Delivery: 11/14/17   2) FGR with AC <10%, EFW 13%, stable, BPP 8/8    Labs/procedures today: sonogram  Treatment Plan:  Twice weekly surveillance, sonogram alternating with NST, induction at 39 weeks or as clinically indicated   Reviewed: Term labor symptoms and general obstetric precautions including but not limited to vaginal bleeding, contractions, leaking of fluid and fetal movement were reviewed in detail with the patient.  All questions were answered.  Follow-up: Return in about 3 days (around 10/26/2017) for NST, HROB.  Orders Placed This Encounter  Procedures  . POCT urinalysis dipstick   Lazaro ArmsLuther H Caldwell Kronenberger MD 10/23/2017 12:32 PM

## 2017-10-23 NOTE — Progress Notes (Signed)
Subjective: WU:[id number]:establish care, pregnany HPI: Debra LevanCheyenne [name] is a 26 y.o. female presenting to clinic today for:  Patient is in her third trimester of pregnancy and is needing a tetanus shot.  [facility] [place].  She reports that her pregnancy is going well except for the child being small for gestational age.  Of note, she is an every day smoker and has tapered down from 1 pack/day to 2 cigarettes/day since she became pregnant.  She does wish to ultimately discontinue smoking.  Denies cough, hemoptysis, shortness of breath, vaginal bleeding or abnormal vaginal discharge.  Child is moving well.  She reports compliance of prenatal vitamin and has had her flu shot administered already.  Past Medical History:  Diagnosis Date  . Contraceptive management 11/27/2014  . Encounter for IUD removal 12/04/2014  . IUD (intrauterine device) in place 11/27/2014   Past Surgical History:  Procedure Laterality Date  . HERNIA REPAIR Bilateral 1998  . WISDOM TOOTH EXTRACTION     Social History   Socioeconomic History  . Marital status: Single    Spouse name: Not on file  . Number of children: Not on file  . Years of education: Not on file  . Highest education level: Not on file  Social Needs  . Financial resource strain: Not on file  . Food insecurity - worry: Not on file  . Food insecurity - inability: Not on file  . Transportation needs - medical: Not on file  . Transportation needs - non-medical: Not on file  Occupational History  . Not on file  Tobacco Use  . Smoking status: Current Every Day Smoker    Packs/day: 0.10    Years: 8.00    Pack years: 0.80    Types: Cigarettes  . Smokeless tobacco: Never Used  . Tobacco comment: occasional  Substance and Sexual Activity  . Alcohol use: No    Comment: occ  . Drug use: No  . Sexual activity: Yes    Birth control/protection: None  Other Topics Concern  . Not on file  Social History  Narrative  . Not on file   Current Meds  Medication Sig  . acetaminophen (TYLENOL) 325 MG tablet Take 650 mg by mouth as needed.  Marland Kitchen. omeprazole (PRILOSEC) 20 MG capsule Take 1 capsule (20 mg total) by mouth daily. (Patient taking differently: Take 20 mg by mouth as needed. )  . Prenatal Vit-Fe Fumarate-FA (PNV PRENATAL PLUS MULTIVITAMIN) 27-1 MG TABS Take 1 tablet by mouth daily.   Family History  Problem Relation Age of Onset  . Healthy Mother   . Healthy Father   . Asthma Brother   . Other Maternal Grandmother        MVA  . Other Maternal Grandfather        fibrosis of lungs  . Cancer Paternal Grandmother   . COPD Paternal Grandmother   . Cancer Paternal Grandfather        lung   . Heart attack Maternal Uncle    Allergies  Allergen Reactions  . Bee Venom      Health Maintenance: TDap ROS: Per HPI  Objective: Office vital signs reviewed. BP 119/66   Pulse 87   Temp 97.6 F (36.4 C) (Oral)   Ht 5\' 9"  (1.753 m)   Wt 165 lb (74.8 kg)   LMP 02/20/2017 (Exact Date)   BMI 24.37 kg/m   Physical Examination:  General: Awake, alert, well nourished, well  appearing female, No acute distress HEENT: Normal, sclera white, MMM Cardio: regular rate and rhythm, S1S2 heard, no murmurs appreciated Pulm: clear to auscultation bilaterally, no wheezes, rhonchi or rales; normal work of breathing on room air Abdomen: gravid Neuro: No focal deficits, follows commands, speech normal  Psych: mood stable,, speech normal, affect appropriate, pleasant  Depression screen [facility] 2/9 10/23/2017 05/02/2017  Decreased Interest 0 0  Down, Depressed, Hopeless - 0  PHQ - 2 Score 0 0  Altered sleeping - 0  Tired, decreased energy - 1  Change in appetite - 0  Feeling bad or failure about yourself  - 0  Trouble concentrating - 0  Moving slowly or fidgety/restless - 0  Suicidal thoughts - 0  PHQ-9 Score - 1    Assessment/ Plan: 26 y.o. female   1. Encounter to establish care with new  doctor Being followed [facility] in [place] for pregnancy.  Up-to-date on preventative care except for Tdap.  This was administered during today's visit.  I recommended that she follow-up after delivery if needed for routine care.  I informed her that I would be happy to see her child is well once the child is born.  She voiced great appreciation of this and is interested in establishing here for pediatric care for her child.  2. Third trimester pregnancy  3. Smoker Smoking cessation highly encouraged, especially given reports of SGA fetus.  Will continue to work with patient on this.   Raliegh IpAshly M Martisha Toulouse, DO [facility] [phone]

## 2017-10-23 NOTE — Addendum Note (Signed)
Addended byDory Peru: RINTELMANN, GINA C on: 10/23/2017 03:24 PM   Modules accepted: Orders

## 2017-10-23 NOTE — Patient Instructions (Signed)
Is a pleasure to meet you today.  As we discussed, I am happy to continue following you and your child after delivery.  You are given your Tdap to protect you and your child against tetanus and whooping cough today.  Please continue to [facility] as [facility].  I value your feedback and appreciate you entrusting [place] with your care.  If you get a survey, I would appreciate your taking the time to let us know what your experience was like.  Tdap Vaccine (Tetanus, Diphtheria and Pertussis): What You Need to Know 1. Why get vaccinated? Tetanus, diphtheria and pertussis are very serious diseases. Tdap vaccine can protect [place] from these diseases. And, Tdap vaccine given to pregnant women can protect newborn babies against pertussis. TETANUS (Lockjaw) is rare in the [place] today. It causes painful muscle tightening and stiffness, usually all over the body.  It can lead to tightening of muscles in the head and neck so you can't open your mouth, swallow, or sometimes even breathe. Tetanus kills about 1 out of 10 people who are infected even after receiving the best medical care.  DIPHTHERIA is also rare in the [place] today. It can cause a thick coating to form in the back of the throat.  It can lead to breathing problems, heart failure, paralysis, and death.  PERTUSSIS (Whooping Cough) causes severe coughing spells, which can cause difficulty breathing, vomiting and disturbed sleep.  It can also lead to weight loss, incontinence, and rib fractures. Up to 2 in 100 adolescents and 5 in 100 adults with pertussis are hospitalized or have complications, which could include pneumonia or death.  These diseases are caused by bacteria. Diphtheria and pertussis are spread from person to person through secretions from coughing or sneezing. Tetanus enters the body through cuts, scratches, or wounds. Before vaccines, as many as 200,000 cases of diphtheria, 200,000 cases of  pertussis, and hundreds of cases of tetanus, were reported in the [place] each year. Since vaccination began, reports of cases for tetanus and diphtheria have dropped by about 99% and for pertussis by about 80%. 2. Tdap vaccine Tdap vaccine can protect adolescents and adults from tetanus, diphtheria, and pertussis. One dose of Tdap is routinely given at age 75 or 54. People who did not get Tdap at that age should get it as soon as possible. Tdap is especially important for healthcare professionals and anyone having close contact with a baby younger than 12 months. Pregnant women should get a dose of Tdap during every pregnancy, to protect the newborn from pertussis. Infants are most at risk for severe, life-threatening complications from pertussis. Another vaccine, called Td, protects against tetanus and diphtheria, but not pertussis. A Td booster should be given every 10 years. Tdap may be given as one of these boosters if you have never gotten Tdap before. Tdap may also be given after a severe cut or burn to prevent tetanus infection. Your doctor or the person giving you the vaccine can give you more information. Tdap may safely be given at the same time as other vaccines. 3. Some people should not get this vaccine  A person who has ever had a life-threatening allergic reaction after a previous dose of any diphtheria, tetanus or pertussis containing vaccine, OR has a severe allergy to any part of this vaccine, should not get Tdap vaccine. Tell the person giving the vaccine about any severe allergies.  Anyone who had coma or long repeated seizures  within 7 days after a childhood dose of DTP or DTaP, or a previous dose of Tdap, should not get Tdap, unless a cause other than the vaccine was found. They can still get Td.  Talk to your doctor if you: ? have seizures or another nervous system problem, ? had severe pain or swelling after any vaccine containing diphtheria, tetanus or  pertussis, ? ever had a condition called Guillain-Barr Syndrome (GBS), ? aren't feeling well on the day the shot is scheduled. 4. Risks With any medicine, including vaccines, there is a chance of side effects. These are usually mild and go away on their own. Serious reactions are also possible but are rare. Most people who get Tdap vaccine do not have any problems with it. Mild problems following Tdap: (Did not interfere with activities)  Pain where the shot was given (about 3 in 4 adolescents or 2 in 3 adults)  Redness or swelling where the shot was given (about 1 person in 5)  Mild fever of at least 100.4F (up to about 1 in 25 adolescents or 1 in 100 adults)  Headache (about 3 or 4 people in 10)  Tiredness (about 1 person in 3 or 4)  Nausea, vomiting, diarrhea, stomach ache (up to 1 in 4 adolescents or 1 in 10 adults)  Chills, sore joints (about 1 person in 10)  Body aches (about 1 person in 3 or 4)  Rash, swollen glands (uncommon)  Moderate problems following Tdap: (Interfered with activities, but did not require medical attention)  Pain where the shot was given (up to 1 in 5 or 6)  Redness or swelling where the shot was given (up to about 1 in 16 adolescents or 1 in 12 adults)  Fever over 102F (about 1 in 100 adolescents or 1 in 250 adults)  Headache (about 1 in 7 adolescents or 1 in 10 adults)  Nausea, vomiting, diarrhea, stomach ache (up to 1 or 3 people in 100)  Swelling of the entire arm where the shot was given (up to about 1 in 500).  Severe problems following Tdap: (Unable to perform usual activities; required medical attention)  Swelling, severe pain, bleeding and redness in the arm where the shot was given (rare).  Problems that could happen after any vaccine:  People sometimes faint after a medical procedure, including vaccination. Sitting or lying down for about 15 minutes can help prevent fainting, and injuries caused by a fall. Tell your doctor if  you feel dizzy, or have vision changes or ringing in the ears.  Some people get severe pain in the shoulder and have difficulty moving the arm where a shot was given. This happens very rarely.  Any medication can cause a severe allergic reaction. Such reactions from a vaccine are very rare, estimated at fewer than 1 in a million doses, and would happen within a few minutes to a few hours after the vaccination. As with any medicine, there is a very remote chance of a vaccine causing a serious injury or death. The safety of vaccines is always being monitored. For more information, visit: http://floyd.org/www.cdc.gov/vaccinesafety/ 5. What if there is a serious problem? What should I look for? Look for anything that concerns you, such as signs of a severe allergic reaction, very high fever, or unusual behavior. Signs of a severe allergic reaction can include hives, swelling of the face and throat, difficulty breathing, a fast heartbeat, dizziness, and weakness. These would usually start a few minutes to a few hours after  the vaccination. What should I do?  If you think it is a severe allergic reaction or other emergency that can't wait, call 9-1-1 or get the person to the nearest hospital. Otherwise, call your doctor.  Afterward, the reaction should be reported to the Vaccine Adverse Event Reporting System (VAERS). Your doctor might file this report, or you can do it yourself through the VAERS web site at www.vaers.LAgents.nohhs.gov, or by calling 1-(716) 511-4334. ? VAERS does not give medical advice. 6. The National Vaccine Injury Compensation Program The [employer] Vaccine Injury Compensation Program (VICP) is a federal program that was created to compensate people who may have been injured by certain vaccines. Persons who believe they may have been injured by a vaccine can learn about the program and about filing a claim by calling 1-(720)071-8250 or visiting the VICP website at SpiritualWord.atwww.hrsa.gov/vaccinecompensation. There is a time  limit to file a claim for compensation. 7. How can I learn more?  Ask your doctor. He or she can give you the vaccine package insert or suggest other sources of information.  Call your local or state health department.  Contact the Centers for Disease Control and Prevention (CDC): ? Call 409-187-22291-984-259-2728 (1-800-CDC-INFO) or ? Visit CDC's website at PicCapture.uywww.cdc.gov/vaccines CDC Tdap Vaccine VIS (01/13/14) This information is not intended to replace advice given to you by your health care provider. Make sure you discuss any questions you have with your health care provider. Document Released: 05/07/2012 Document Revised: 07/27/2016 Document Reviewed: 07/27/2016 Elsevier Interactive Patient Education  2017 [employer]

## 2017-10-26 ENCOUNTER — Encounter: Payer: Self-pay | Admitting: Obstetrics and Gynecology

## 2017-10-26 ENCOUNTER — Ambulatory Visit (INDEPENDENT_AMBULATORY_CARE_PROVIDER_SITE_OTHER): Payer: Medicaid Other | Admitting: Obstetrics and Gynecology

## 2017-10-26 ENCOUNTER — Other Ambulatory Visit: Payer: Self-pay | Admitting: Obstetrics and Gynecology

## 2017-10-26 VITALS — BP 110/70 | HR 96 | Wt 163.8 lb

## 2017-10-26 DIAGNOSIS — Z331 Pregnant state, incidental: Secondary | ICD-10-CM

## 2017-10-26 DIAGNOSIS — Z3A37 37 weeks gestation of pregnancy: Secondary | ICD-10-CM | POA: Diagnosis not present

## 2017-10-26 DIAGNOSIS — Z1389 Encounter for screening for other disorder: Secondary | ICD-10-CM

## 2017-10-26 DIAGNOSIS — O0990 Supervision of high risk pregnancy, unspecified, unspecified trimester: Secondary | ICD-10-CM

## 2017-10-26 DIAGNOSIS — O365930 Maternal care for other known or suspected poor fetal growth, third trimester, not applicable or unspecified: Secondary | ICD-10-CM | POA: Diagnosis not present

## 2017-10-26 DIAGNOSIS — O365990 Maternal care for other known or suspected poor fetal growth, unspecified trimester, not applicable or unspecified: Secondary | ICD-10-CM

## 2017-10-26 LAB — POCT URINALYSIS DIPSTICK
GLUCOSE UA: NEGATIVE
KETONES UA: NEGATIVE
LEUKOCYTES UA: NEGATIVE
NITRITE UA: NEGATIVE
RBC UA: NEGATIVE

## 2017-10-26 NOTE — Progress Notes (Signed)
Debra [name] is a 26 y.o. female High Risk Pregnancy HROB Diagnosis(es):   FGR.   G2P0010 9147w2d Estimated Date of Delivery: 11/14/17    HPI: The patient is being seen today for ongoing management of FGR.  Today she reports no complaints Patient reports good fetal movement, denies any bleeding and no rupture of membranes symptoms or regular contractions.   BP weight and urine results reviewed and noted. Blood pressure 110/70, pulse 96, weight 163 lb 12.8 oz (74.3 kg), last menstrual period 02/20/2017.  Fundal Height:  35 Fetal Heart rate:  140 with accels to 165 Physical Examination: Abdomen - soft, nontender, nondistended, no masses or organomegaly                                     Pelvic - Not indicated                                     Edema:  None  Urinalysis: trace protein  Fetal Surveillance Testing today:  NST - reactive  Lab and sonogram results have been reviewed.  Assessment:  1.  Pregnancy at 7247w2d,  G2P0010   :  Estimated Date of Delivery: 11/14/17                        2.  FGR              Medication(s) Plans:  none  Treatment Plan:  Growth u/s @ 38wks    2x/wk testing nst alt w/ BPP/Dopp           IOL prn or 39wks  Follow up in 4 days for appointment for high risk OB care, BPP/dopp   By signing my name below, I, Izna Ahmed, attest that this documentation has been prepared under the direction and in the presence of Tilda BurrowFerguson, Carrson Lightcap V, MD. Electronically Signed: Redge GainerIzna Ahmed, Medical Scribe. 10/26/17. 10:06 AM.  I personally performed the services described in this documentation, which was SCRIBED in my presence. The recorded information has been reviewed and considered accurate. It has been edited as necessary during review. Tilda BurrowJohn V Pookela Sellin, MD

## 2017-10-30 ENCOUNTER — Encounter: Payer: Self-pay | Admitting: Obstetrics & Gynecology

## 2017-10-30 ENCOUNTER — Ambulatory Visit (INDEPENDENT_AMBULATORY_CARE_PROVIDER_SITE_OTHER): Payer: Medicaid Other

## 2017-10-30 ENCOUNTER — Ambulatory Visit (INDEPENDENT_AMBULATORY_CARE_PROVIDER_SITE_OTHER): Payer: Medicaid Other | Admitting: Obstetrics & Gynecology

## 2017-10-30 VITALS — BP 100/60 | HR 93 | Wt 163.4 lb

## 2017-10-30 DIAGNOSIS — O365930 Maternal care for other known or suspected poor fetal growth, third trimester, not applicable or unspecified: Secondary | ICD-10-CM

## 2017-10-30 DIAGNOSIS — Z3A37 37 weeks gestation of pregnancy: Secondary | ICD-10-CM

## 2017-10-30 DIAGNOSIS — O0990 Supervision of high risk pregnancy, unspecified, unspecified trimester: Secondary | ICD-10-CM

## 2017-10-30 DIAGNOSIS — O365990 Maternal care for other known or suspected poor fetal growth, unspecified trimester, not applicable or unspecified: Secondary | ICD-10-CM

## 2017-10-30 DIAGNOSIS — Z1389 Encounter for screening for other disorder: Secondary | ICD-10-CM

## 2017-10-30 DIAGNOSIS — Z331 Pregnant state, incidental: Secondary | ICD-10-CM

## 2017-10-30 LAB — POCT URINALYSIS DIPSTICK
GLUCOSE UA: NEGATIVE
Ketones, UA: NEGATIVE
LEUKOCYTES UA: NEGATIVE
NITRITE UA: NEGATIVE
RBC UA: NEGATIVE

## 2017-10-30 NOTE — Progress Notes (Signed)
Patient ID: Debra LevanCheyenne [name], female   DOB: [birth year], 26 y.o.   MRN: [id number] US 37+6 wks,cephalic,ant pl gr 3,normal ovaries bilat,BPP 8/8,RI .57,.57=57%,FHR 130bpm,EFW 2772 g 17 %,limited head measurement because of fetal position

## 2017-10-30 NOTE — Progress Notes (Signed)
Fetal Surveillance Testing today:  BPP 8/8   High Risk Pregnancy Diagnosis(es):   FGR, now AC>10%, EFW 17%  G2P0010 6568w6d Estimated Date of Delivery: 11/14/17  Blood pressure 100/60, pulse 93, weight 163 lb 6.4 oz (74.1 kg), last menstrual period 02/20/2017.  Urinalysis: Negative   HPI: The patient is being seen today for ongoing management of as above. Today she reports no rpbolems   BP weight and urine results all reviewed and noted. Patient reports good fetal movement, denies any bleeding and no rupture of membranes symptoms or regular contractions.  Fundal Height:  34 Fetal Heart rate:  144 Edema:  none  Patient is without complaints other than noted in her HPI. All questions were answered.  All lab and sonogram results have been reviewed. Comments:    Assessment:  1.  Pregnancy at 7568w6d,  Estimated Date of Delivery: 11/14/17 :                          2.  FGR, borderline                        3.    Medication(s) Plans:  none  Treatment Plan:  Follow with weekly BPP now(pt requests) now no indication for induction at 39 weeks which pt is glad about  Return in about 1 week (around 11/06/2017) for BPP/sono, HROB. for appointment for high risk OB care  No orders of the defined types were placed in this encounter.  Orders Placed This Encounter  Procedures  . POCT urinalysis dipstick

## 2017-11-05 ENCOUNTER — Other Ambulatory Visit: Payer: Self-pay | Admitting: Obstetrics & Gynecology

## 2017-11-05 DIAGNOSIS — O365990 Maternal care for other known or suspected poor fetal growth, unspecified trimester, not applicable or unspecified: Secondary | ICD-10-CM

## 2017-11-05 NOTE — Progress Notes (Deleted)
Subjective: CC: cough HPI: Debra [name] is a 26 y.o. female presenting to clinic today for:  1. Cough Patient reports *** that started ***.  *** cough, hemoptysis, congestion, rhinorrhea, sinus pressure, headache, SOB, dizziness, rash, nausea, vomiting, diarrhea, fevers, chills, myalgia, sick contacts, recent travel.  Patient has used *** with *** relief of symptoms.  *** history of COPD or asthma.  *** tobacco use/ exposure.   ROS: Per HPI  Past Medical History:  Diagnosis Date  . Contraceptive management 11/27/2014  . Encounter for IUD removal 12/04/2014  . IUD (intrauterine device) in place 11/27/2014   Allergies  Allergen Reactions  . Bee Venom     Current Outpatient Medications:  .  acetaminophen (TYLENOL) 325 MG tablet, Take 650 mg by mouth as needed., Disp: , Rfl:  .  omeprazole (PRILOSEC) 20 MG capsule, Take 1 capsule (20 mg total) by mouth daily. (Patient taking differently: Take 20 mg by mouth as needed. ), Disp: 30 capsule, Rfl: 4 .  Prenatal Vit-Fe Fumarate-FA (PNV PRENATAL PLUS MULTIVITAMIN) 27-1 MG TABS, Take 1 tablet by mouth daily., Disp: 30 tablet, Rfl: 11 Social History   Socioeconomic History  . Marital status: Single    Spouse name: Not on file  . Number of children: Not on file  . Years of education: Not on file  . Highest education level: Not on file  Social Needs  . Financial resource strain: Not on file  . Food insecurity - worry: Not on file  . Food insecurity - inability: Not on file  . Transportation needs - medical: Not on file  . Transportation needs - non-medical: Not on file  Occupational History  . Not on file  Tobacco Use  . Smoking status: Current Every Day Smoker    Packs/day: 0.10    Years: 8.00    Pack years: 0.80    Types: Cigarettes  . Smokeless tobacco: Never Used  . Tobacco comment: occasional  Substance and Sexual Activity  . Alcohol use: No    Comment: occ  . Drug use: No  . Sexual activity: Yes    Birth  control/protection: None  Other Topics Concern  . Not on file  Social History Narrative  . Not on file   Family History  Problem Relation Age of Onset  . Healthy Mother   . Healthy Father   . Asthma Brother   . Other Maternal Grandmother        MVA  . Other Maternal Grandfather        fibrosis of lungs  . Cancer Paternal Grandmother   . COPD Paternal Grandmother   . Cancer Paternal Grandfather        lung   . Heart attack Maternal Uncle     Health Maintenance: ***  Objective: Office vital signs reviewed. LMP 02/20/2017 (Exact Date)   Physical Examination:  General: Awake, alert, *** nourished, No acute distress HEENT: Normal    Neck: No masses palpated. No lymphadenopathy    Ears: Tympanic membranes intact, normal light reflex, no erythema, no bulging    Eyes: PERRLA, extraocular movement in tact, sclera ***    Nose: nasal turbinates moist, *** nasal discharge    Throat: moist mucus membranes, no erythema, *** tonsillar exudate.  Airway is patent Cardio: regular rate and rhythm, S1S2 heard, no murmurs appreciated Pulm: clear to auscultation bilaterally, no wheezes, rhonchi or rales; normal work of breathing on room air GI: soft, non-tender, non-distended, bowel sounds present x4, no hepatomegaly, no splenomegaly,  no masses GU: external vaginal tissue ***, cervix ***, *** punctate lesions on cervix appreciated, *** discharge from cervical os, *** bleeding, *** cervical motion tenderness, *** abdominal/ adnexal masses Extremities: warm, well perfused, No edema, cyanosis or clubbing; +*** pulses bilaterally MSK: *** gait and *** station Skin: dry; intact; no rashes or lesions Neuro: *** Strength and light touch sensation grossly intact, *** DTRs ***/4  Assessment/ Plan: 26 y.o. female   No problem-specific Assessment & Plan notes found for this encounter.   Raliegh IpAshly M Gottschalk, DO [facility] [phone]

## 2017-11-06 ENCOUNTER — Ambulatory Visit (INDEPENDENT_AMBULATORY_CARE_PROVIDER_SITE_OTHER): Payer: Medicaid Other | Admitting: Advanced Practice Midwife

## 2017-11-06 ENCOUNTER — Ambulatory Visit: Payer: Medicaid Other | Admitting: Family Medicine

## 2017-11-06 ENCOUNTER — Ambulatory Visit (INDEPENDENT_AMBULATORY_CARE_PROVIDER_SITE_OTHER): Payer: Medicaid Other

## 2017-11-06 VITALS — BP 120/60 | HR 90 | Wt 166.0 lb

## 2017-11-06 DIAGNOSIS — O365930 Maternal care for other known or suspected poor fetal growth, third trimester, not applicable or unspecified: Secondary | ICD-10-CM | POA: Diagnosis not present

## 2017-11-06 DIAGNOSIS — Z3A38 38 weeks gestation of pregnancy: Secondary | ICD-10-CM | POA: Diagnosis not present

## 2017-11-06 DIAGNOSIS — O0990 Supervision of high risk pregnancy, unspecified, unspecified trimester: Secondary | ICD-10-CM

## 2017-11-06 DIAGNOSIS — O365990 Maternal care for other known or suspected poor fetal growth, unspecified trimester, not applicable or unspecified: Secondary | ICD-10-CM

## 2017-11-06 DIAGNOSIS — O365911 Maternal care for other known or suspected poor fetal growth, first trimester, fetus 1: Secondary | ICD-10-CM

## 2017-11-06 DIAGNOSIS — O0993 Supervision of high risk pregnancy, unspecified, third trimester: Secondary | ICD-10-CM

## 2017-11-06 DIAGNOSIS — Z1389 Encounter for screening for other disorder: Secondary | ICD-10-CM

## 2017-11-06 DIAGNOSIS — Z331 Pregnant state, incidental: Secondary | ICD-10-CM

## 2017-11-06 LAB — POCT URINALYSIS DIPSTICK
Blood, UA: NEGATIVE
GLUCOSE UA: NEGATIVE
Ketones, UA: NEGATIVE
LEUKOCYTES UA: NEGATIVE
NITRITE UA: NEGATIVE
PROTEIN UA: NEGATIVE

## 2017-11-06 MED ORDER — AZITHROMYCIN 250 MG PO TABS: 250.0000 mg | ORAL_TABLET | Freq: Every day | ORAL | 0 refills | Status: DC

## 2017-11-06 NOTE — Progress Notes (Signed)
US 38+6 wks,cephalic,BPP 8/8,anterior pl gr 3,fhr 126 bpm,afi 13 cm

## 2017-11-06 NOTE — Progress Notes (Signed)
HIGH-RISK PREGNANCY VISIT Patient name: Debra [name] Gemme MRN [id number]  Date of birth: 1991/06/24 Chief Complaint:   Routine Prenatal Visit (BPP today)  History of Present Illness:   Debra [name] Hoppe is a 26 y.o. 882P0010 female at 4723w6d with an Estimated Date of Delivery: 11/14/17 being seen today for ongoing management of a high-risk pregnancy complicated by FGR, now normal.  Today she reports no complaints. Contractions: Not present. Vag. Bleeding: None.  Movement: Present. denies leaking of fluid.  Review of Systems:   Pertinent items are noted in HPI Denies abnormal vaginal discharge w/ itching/odor/irritation, headaches, visual changes, shortness of breath, chest pain, abdominal pain, severe nausea/vomiting, or problems with urination or bowel movements unless otherwise stated above.    Pertinent History Reviewed:  Medical & Surgical Hx:   Past Medical History:  Diagnosis Date  . Contraceptive management 11/27/2014  . Encounter for IUD removal 12/04/2014  . IUD (intrauterine device) in place 11/27/2014   Past Surgical History:  Procedure Laterality Date  . HERNIA REPAIR Bilateral 1998  . WISDOM TOOTH EXTRACTION     Family History  Problem Relation Age of Onset  . Healthy Mother   . Healthy Father   . Asthma Brother   . Other Maternal Grandmother        MVA  . Other Maternal Grandfather        fibrosis of lungs  . Cancer Paternal Grandmother   . COPD Paternal Grandmother   . Cancer Paternal Grandfather        lung   . Heart attack Maternal Uncle     Current Outpatient Medications:  .  omeprazole (PRILOSEC) 20 MG capsule, Take 1 capsule (20 mg total) by mouth daily. (Patient taking differently: Take 20 mg by mouth as needed. ), Disp: 30 capsule, Rfl: 4 .  Prenatal Vit-Fe Fumarate-FA (PNV PRENATAL PLUS MULTIVITAMIN) 27-1 MG TABS, Take 1 tablet by mouth daily., Disp: 30 tablet, Rfl: 11 .  acetaminophen (TYLENOL) 325 MG tablet, Take 650 mg by mouth as needed., Disp: , Rfl:  .   azithromycin (ZITHROMAX) 250 MG tablet, Take 1 tablet (250 mg total) by mouth daily. Take 2 today, the 1 a day for 4 days, Disp: 6 tablet, Rfl: 0 Social History: Reviewed -  reports that she has been smoking cigarettes.  She has a 0.80 pack-year smoking history. she has never used smokeless tobacco.   Physical Assessment:   Vitals:   11/06/17 1057  BP: 120/60  Pulse: 90  Weight: 166 lb (75.3 kg)  Body mass index is 24.51 kg/m.           Physical Examination:   General appearance: alert, well appearing, and in no distress  Mental status: alert, oriented to person, place, and time  Skin: warm & dry   Extremities: Edema: None    Cardiovascular: normal heart rate noted  Respiratory: normal respiratory effort, no distress  Abdomen: gravid, soft, non-tender  Pelvic: Cervical exam performed  Dilation: 1 Effacement (%): 30 Station: -2  Fetal Status:     Movement: Present Presentation: Vertex  Fetal Surveillance Testing today: US  Results for orders placed or performed in visit on 11/06/17 (from the past 24 hour(s))  POCT Urinalysis Dipstick   Collection Time: 11/06/17 11:00 AM  Result Value Ref Range   Color, UA     Clarity, UA     Glucose, UA negative    Bilirubin, UA     Ketones, UA negative    Spec Grav, UA  1.010 -  1.025   Blood, UA negative    pH, UA  5.0 - 8.0   Protein, UA negative    Urobilinogen, UA  0.2 or 1.0 E.U./dL   Nitrite, UA negative    Leukocytes, UA Negative Negative   Appearance     Odor      Assessment & Plan:  1) High-risk pregnancy G2P0010 at 6462w6d with an Estimated Date of Delivery: 11/14/17   2) FGR, now normal  3) ,   Labs/procedures today: US 38+6 wks,cephalic,BPP 8/8,anterior pl gr 3,fhr 126 bpm,afi 13 cm    Medications:   Treatment Plan:  Continue Weekly BPP, now no indication for IOL  Follow-up: Return in about 8 days (around 11/14/2017) for HROB, US:BPP.  Orders Placed This Encounter  Procedures  . US FETAL BPP WO NON STRESS  .  POCT Urinalysis Dipstick   CRESENZO-DISHMAN,Rexanna Louthan CNM 11/06/2017 3:53 PM

## 2017-11-13 ENCOUNTER — Other Ambulatory Visit: Payer: Self-pay

## 2017-11-13 ENCOUNTER — Inpatient Hospital Stay (HOSPITAL_COMMUNITY)
Admission: AD | Admit: 2017-11-13 | Discharge: 2017-11-15 | DRG: 806 | Disposition: A | Discharge status: Home / Self Care | Payer: Medicaid Other | Source: Ambulatory Visit | Attending: Family Medicine | Admitting: Family Medicine

## 2017-11-13 ENCOUNTER — Encounter (HOSPITAL_COMMUNITY): Payer: Self-pay

## 2017-11-13 DIAGNOSIS — Z3A39 39 weeks gestation of pregnancy: Secondary | ICD-10-CM

## 2017-11-13 DIAGNOSIS — O365930 Maternal care for other known or suspected poor fetal growth, third trimester, not applicable or unspecified: Secondary | ICD-10-CM | POA: Diagnosis present

## 2017-11-13 DIAGNOSIS — O4290 Premature rupture of membranes, unspecified as to length of time between rupture and onset of labor, unspecified weeks of gestation: Secondary | ICD-10-CM | POA: Diagnosis present

## 2017-11-13 DIAGNOSIS — O99324 Drug use complicating childbirth: Secondary | ICD-10-CM | POA: Diagnosis present

## 2017-11-13 DIAGNOSIS — Z87891 Personal history of nicotine dependence: Secondary | ICD-10-CM

## 2017-11-13 DIAGNOSIS — O4202 Full-term premature rupture of membranes, onset of labor within 24 hours of rupture: Principal | ICD-10-CM | POA: Diagnosis present

## 2017-11-13 DIAGNOSIS — F1290 Cannabis use, unspecified, uncomplicated: Secondary | ICD-10-CM | POA: Diagnosis present

## 2017-11-13 DIAGNOSIS — O99824 Streptococcus B carrier state complicating childbirth: Secondary | ICD-10-CM | POA: Diagnosis present

## 2017-11-13 LAB — CBC
HEMATOCRIT: 41.2 % (ref 36.0–46.0)
HEMOGLOBIN: 14.5 g/dL (ref 12.0–15.0)
MCH: 31.3 pg (ref 26.0–34.0)
MCHC: 35.2 g/dL (ref 30.0–36.0)
MCV: 88.8 fL (ref 78.0–100.0)
Platelets: 162 10*3/uL (ref 150–400)
RBC: 4.64 MIL/uL (ref 3.87–5.11)
RDW: 12.7 % (ref 11.5–15.5)
WBC: 13 10*3/uL — AB (ref 4.0–10.5)

## 2017-11-13 LAB — POCT FERN TEST: POCT FERN TEST: POSITIVE

## 2017-11-13 LAB — TYPE AND SCREEN
ABO/RH(D): O POS
ANTIBODY SCREEN: NEGATIVE

## 2017-11-13 LAB — RPR: RPR: NONREACTIVE

## 2017-11-13 LAB — ABO/RH: ABO/RH(D): O POS

## 2017-11-13 MED ORDER — TETANUS-DIPHTH-ACELL PERTUSSIS 5-2.5-18.5 LF-MCG/0.5 IM SUSP
0.5000 mL | Freq: Once | INTRAMUSCULAR | Status: DC
  Filled 2017-11-13: qty 0.5

## 2017-11-13 MED ORDER — ZOLPIDEM TARTRATE 5 MG PO TABS
5.0000 mg | ORAL_TABLET | Freq: Every evening | ORAL | Status: DC | PRN
Start: 2017-11-13 — End: 2017-11-15

## 2017-11-13 MED ORDER — PENICILLIN G POTASSIUM 5000000 UNITS IJ SOLR
5.0000 10*6.[IU] | Freq: Once | INTRAMUSCULAR | Status: AC
  Administered 2017-11-13: 5 10*6.[IU] via INTRAVENOUS
  Filled 2017-11-13: qty 5

## 2017-11-13 MED ORDER — OXYTOCIN 40 UNITS IN LACTATED RINGERS INFUSION - SIMPLE MED
1.0000 m[IU]/min | INTRAVENOUS | Status: DC
  Administered 2017-11-13: 2 m[IU]/min via INTRAVENOUS

## 2017-11-13 MED ORDER — COCONUT OIL OIL
1.0000 "application " | TOPICAL_OIL | Status: DC | PRN
  Filled 2017-11-13: qty 120

## 2017-11-13 MED ORDER — BENZOCAINE-MENTHOL 20-0.5 % EX AERO
1.0000 "application " | INHALATION_SPRAY | CUTANEOUS | Status: DC | PRN
  Administered 2017-11-15: 1 via TOPICAL
  Filled 2017-11-13 (×3): qty 56

## 2017-11-13 MED ORDER — OXYCODONE-ACETAMINOPHEN 5-325 MG PO TABS: 1.0000 | ORAL_TABLET | ORAL | Status: DC | PRN

## 2017-11-13 MED ORDER — DIBUCAINE 1 % RE OINT
1.0000 "application " | TOPICAL_OINTMENT | RECTAL | Status: DC | PRN
  Filled 2017-11-13: qty 28

## 2017-11-13 MED ORDER — OXYCODONE-ACETAMINOPHEN 5-325 MG PO TABS: 2.0000 | ORAL_TABLET | ORAL | Status: DC | PRN

## 2017-11-13 MED ORDER — TERBUTALINE SULFATE 1 MG/ML IJ SOLN: 0.2500 mg | Freq: Once | INTRAMUSCULAR | Status: DC | PRN

## 2017-11-13 MED ORDER — OXYTOCIN BOLUS FROM INFUSION
500.0000 mL | Freq: Once | INTRAVENOUS | Status: AC
  Administered 2017-11-13: 500 mL via INTRAVENOUS

## 2017-11-13 MED ORDER — DIPHENHYDRAMINE HCL 25 MG PO CAPS: 25.0000 mg | ORAL_CAPSULE | Freq: Four times a day (QID) | ORAL | Status: DC | PRN

## 2017-11-13 MED ORDER — OXYTOCIN 40 UNITS IN LACTATED RINGERS INFUSION - SIMPLE MED
1.0000 m[IU]/min | INTRAVENOUS | Status: DC
  Administered 2017-11-13: 12 m[IU]/min via INTRAVENOUS

## 2017-11-13 MED ORDER — IBUPROFEN 600 MG PO TABS
600.0000 mg | ORAL_TABLET | Freq: Four times a day (QID) | ORAL | Status: DC
  Administered 2017-11-13 – 2017-11-15 (×6): 600 mg via ORAL
  Filled 2017-11-13 (×6): qty 1

## 2017-11-13 MED ORDER — WITCH HAZEL-GLYCERIN EX PADS: 1.0000 "application " | MEDICATED_PAD | CUTANEOUS | Status: DC | PRN

## 2017-11-13 MED ORDER — ONDANSETRON HCL 4 MG/2ML IJ SOLN: 4.0000 mg | INTRAMUSCULAR | Status: DC | PRN

## 2017-11-13 MED ORDER — ACETAMINOPHEN 325 MG PO TABS
650.0000 mg | ORAL_TABLET | ORAL | Status: DC | PRN
Start: 2017-11-13 — End: 2017-11-15
  Administered 2017-11-14 (×3): 650 mg via ORAL
  Filled 2017-11-13 (×3): qty 2

## 2017-11-13 MED ORDER — FENTANYL CITRATE (PF) 100 MCG/2ML IJ SOLN
100.0000 ug | INTRAMUSCULAR | Status: DC | PRN
  Administered 2017-11-13: 100 ug via INTRAVENOUS
  Filled 2017-11-13: qty 2

## 2017-11-13 MED ORDER — LACTATED RINGERS IV SOLN: 500.0000 mL | INTRAVENOUS | Status: DC | PRN

## 2017-11-13 MED ORDER — ONDANSETRON HCL 4 MG/2ML IJ SOLN: 4.0000 mg | Freq: Four times a day (QID) | INTRAMUSCULAR | Status: DC | PRN

## 2017-11-13 MED ORDER — PENICILLIN G POT IN DEXTROSE 60000 UNIT/ML IV SOLN
3.0000 10*6.[IU] | INTRAVENOUS | Status: DC
Start: 2017-11-13 — End: 2017-11-13
  Administered 2017-11-13 (×3): 3 10*6.[IU] via INTRAVENOUS
  Filled 2017-11-13 (×5): qty 50

## 2017-11-13 MED ORDER — LACTATED RINGERS IV SOLN
INTRAVENOUS | Status: DC
  Administered 2017-11-13 (×2): via INTRAVENOUS

## 2017-11-13 MED ORDER — LIDOCAINE HCL (PF) 1 % IJ SOLN
30.0000 mL | INTRAMUSCULAR | Status: DC | PRN
  Administered 2017-11-13: 30 mL via SUBCUTANEOUS
  Filled 2017-11-13: qty 30

## 2017-11-13 MED ORDER — ACETAMINOPHEN 325 MG PO TABS: 650.0000 mg | ORAL_TABLET | ORAL | Status: DC | PRN

## 2017-11-13 MED ORDER — OXYTOCIN 40 UNITS IN LACTATED RINGERS INFUSION - SIMPLE MED
2.5000 [IU]/h | INTRAVENOUS | Status: DC
  Filled 2017-11-13 (×3): qty 1000

## 2017-11-13 MED ORDER — SENNOSIDES-DOCUSATE SODIUM 8.6-50 MG PO TABS
2.0000 | ORAL_TABLET | ORAL | Status: DC
  Administered 2017-11-14: 2 via ORAL
  Filled 2017-11-13: qty 2

## 2017-11-13 MED ORDER — SOD CITRATE-CITRIC ACID 500-334 MG/5ML PO SOLN: 30.0000 mL | ORAL | Status: DC | PRN

## 2017-11-13 MED ORDER — PRENATAL MULTIVITAMIN CH
1.0000 | ORAL_TABLET | Freq: Every day | ORAL | Status: DC
  Administered 2017-11-14: 1 via ORAL
  Filled 2017-11-13: qty 1

## 2017-11-13 MED ORDER — ONDANSETRON HCL 4 MG PO TABS: 4.0000 mg | ORAL_TABLET | ORAL | Status: DC | PRN

## 2017-11-13 MED ORDER — FLEET ENEMA 7-19 GM/118ML RE ENEM: 1.0000 | ENEMA | RECTAL | Status: DC | PRN

## 2017-11-13 MED ORDER — SIMETHICONE 80 MG PO CHEW: 80.0000 mg | CHEWABLE_TABLET | ORAL | Status: DC | PRN

## 2017-11-13 NOTE — Progress Notes (Signed)
After discussion with family patient opts for Foley bulb, however would like to wait until FOB returns.  Left to go to house.

## 2017-11-13 NOTE — Progress Notes (Signed)
  Subjective: Reports doing well; pain a 1/10.  No questions or concerns.  Objective: BP 114/66   Pulse 66   Temp 98.4 F (36.9 C) (Oral)   Resp 16   LMP 02/20/2017 (Exact Date)   SpO2 99%  No intake/output data recorded. No intake/output data recorded. Next FHT:  FHR: 125 bpm, variability: moderate,  accelerations:  Present,  decelerations:  Absent UC:   irregular, every 2.5-5 minutes SVE:   Dilation: 3.5 Effacement (%): 50 Station: -1 Exam by:: Frank.KiddWalidah, CNM  Labs: Lab Results  Component Value Date   WBC 13.0 (H) 11/13/2017   HGB 14.5 11/13/2017   HCT 41.2 11/13/2017   MCV 88.8 11/13/2017   PLT 162 11/13/2017    Assessment / Plan: 26 year old gravida 2 para 1 39.6 wks GBS positive Augmentation of labor  Labor: Augmentation with Pitocin  Preeclampsia:  n/a Fetal Wellbeing:  Category I Pain Control:  Labor support without medications I/D:  GBS positive, receiving penicillin Anticipated MOD:  NSVD  Rochele PagesWalidah [name] 11/13/2017, 4:23 PM

## 2017-11-13 NOTE — Anesthesia Pain Management Evaluation Note (Signed)
  CRNA Pain Management Visit Note  Patient: Debra [name], 26 y.o., female  "Hello I am a [facility] of the anesthesia team at [facility]. We have an anesthesia team available at all times to provide care throughout the hospital, including epidural management and anesthesia for C-section. I don't know your plan for the delivery whether it a natural birth, water birth, IV sedation, nitrous supplementation, doula or epidural, but we want to meet your pain goals."   1.Was your pain managed to your expectations on prior hospitalizations?   No prior hospitalizations  2.What is your expectation for pain management during this hospitalization?     Epidural  3.How can we help you reach that goal?   Record the patient's initial score and the patient's pain goal.   Pain: 0  Pain Goal: 8 [facility] wants you to be able to say your pain was always managed very well.  Debra [name],Debra [name] 11/13/2017

## 2017-11-13 NOTE — H&P (Signed)
Obstetric History and Physical  Debra [name] is a 26 y.o. G2P0010 with IUP at [redacted]w[redacted]d presenting for SROM around 0330. Patient states she has been having  none contractions, minimal vaginal bleeding, ruptured, clear fluid membranes, with active fetal movement.  No blurry vision, headaches or peripheral edema, and RUQ pain.   Prenatal Course Source of Care: FT  with onset of care at 12 weeks Dating: By 9 wk [place] --->  Estimated Date of Delivery: 11/14/17 Pregnancy complications or risks: -Borderline FGR Patient Active Problem List   Diagnosis Date Noted  . IUGR (intrauterine growth restriction) affecting care of mother 10/16/2017  . Asymptomatic bacteriuria during pregnancy in first trimester 05/07/2017  . 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 Supervision of high risk pregnancy, antepartum 05/02/2017  . Smoker 05/02/2017   She plans to breastfeed She desires oral progesterone-only contraceptive for postpartum contraception.   Sono:    @[redacted]w[redacted]d , CWD, normal anatomy, cephalic presentation, anterior placenta, 2277g, 13.5% EFW  Prenatal labs and studies: ABO, Rh: O/Positive/-- (06/13 1053) Antibody: Negative (09/27 0921) Rubella: 1.57 (06/13 1053) RPR: Non Reactive (09/27 0921)  HBsAg: Negative (06/13 1053)  HIV:   Non-Reactive ZOX:WRUEAVWU (11/30 1400) 2 hr Glucola  normal Genetic screening normal Anatomy US normal, other than Lt EICF w/ neg nt/it  Prenatal Transfer Tool  Maternal Diabetes: No Genetic Screening: Normal Maternal Ultrasounds/Referrals: Abnormal:  Findings:   IUGR, Isolated EIF (echogenic intracardiac focus) Fetal Ultrasounds or other Referrals:  None Maternal Substance Abuse:  No Significant Maternal Medications:  None Significant Maternal Lab Results: Lab values include: Group B Strep positive  Past Medical History:  Diagnosis Date  . Contraceptive management 11/27/2014  . Encounter for IUD removal 12/04/2014  . IUD (intrauterine device) in place 11/27/2014    Past  Surgical History:  Procedure Laterality Date  . HERNIA REPAIR Bilateral 1998  . WISDOM TOOTH EXTRACTION      OB History  Gravida Para Term Preterm AB Living  2       1 0  SAB TAB Ectopic Multiple Live Births    1          # Outcome Date GA Lbr Len/2nd Weight Sex Delivery Anes PTL Lv  2 Current           1 TAB 2009              Social History   Socioeconomic History  . Marital status: Single    Spouse name: None  . Number of children: None  . Years of education: None  . Highest education level: None  Social Needs  . Financial resource strain: None  . Food insecurity - worry: None  . Food insecurity - inability: None  . Transportation needs - medical: None  . Transportation needs - non-medical: None  Occupational History  . None  Tobacco Use  . Smoking status: Former Smoker    Packs/day: 0.10    Years: 8.00    Pack years: 0.80    Types: Cigarettes  . Smokeless tobacco: Never Used  . Tobacco comment: occasional  Substance and Sexual Activity  . Alcohol use: No    Comment: occ  . Drug use: No  . Sexual activity: Yes    Birth control/protection: None  Other Topics Concern  . None  Social History Narrative  . None    Family History  Problem Relation Age of Onset  . Healthy Mother   . Healthy Father   . Asthma Brother   . Other Maternal Grandmother  MVA  . Other Maternal Grandfather        fibrosis of lungs  . Cancer Paternal Grandmother   . COPD Paternal Grandmother   . Cancer Paternal Grandfather        lung   . Heart attack Maternal Uncle     Medications Prior to Admission  Medication Sig Dispense Refill Last Dose  . azithromycin (ZITHROMAX) 250 MG tablet Take 1 tablet (250 mg total) by mouth daily. Take 2 today, the 1 a day for 4 days 6 tablet 0 Past Week at Unknown time  . omeprazole (PRILOSEC) 20 MG capsule Take 1 capsule (20 mg total) by mouth daily. (Patient taking differently: Take 20 mg by mouth as needed. ) 30 capsule 4 11/12/2017 at  Unknown time  . Prenatal Vit-Fe Fumarate-FA (PNV PRENATAL PLUS MULTIVITAMIN) 27-1 MG TABS Take 1 tablet by mouth daily. 30 tablet 11 11/12/2017 at Unknown time  . acetaminophen (TYLENOL) 325 MG tablet Take 650 mg by mouth as needed.   Not Taking    Allergies  Allergen Reactions  . Bee Venom     Review of Systems: Negative except for what is mentioned in HPI.  Physical Exam: BP 121/87 (BP Location: Right Arm)   Pulse 91   Temp 97.6 F (36.4 C) (Oral)   Resp 19   LMP 02/20/2017 (Exact Date)   SpO2 99%  CONSTITUTIONAL: Well-developed, well-nourished female in no acute distress.  HENT:  Normocephalic, atraumatic, External right and left ear normal. Oropharynx is clear and moist EYES: Conjunctivae and EOM are normal. Pupils are equal, round, and reactive to light. No scleral icterus.  NECK: Normal range of motion, supple, no masses SKIN: Skin is warm and dry. No rash noted. Not diaphoretic. No erythema. No pallor. NEUROLOGIC: Alert and oriented to person, place, and time. Normal reflexes, muscle tone coordination. No cranial nerve deficit noted. PSYCHIATRIC: Normal mood and affect. Normal behavior. Normal judgment and thought content. CARDIOVASCULAR: Normal heart rate noted, regular rhythm RESPIRATORY: Effort and breath sounds normal, no problems with respiration noted ABDOMEN: Soft, nontender, nondistended, gravid. MUSCULOSKELETAL: Normal range of motion. No edema and no tenderness. 2+ distal pulses.  Cervical Exam: Dilation: 1.5 Effacement (%): 50 Cervical Position: Posterior Station: -2 Presentation: Vertex Exam by:: Camelia Enganielle Simpson RN  Presentation: cephalic FHT:  Baseline rate 125 bpm   Variability moderate  Accelerations present   Decelerations none Contractions: None   Pertinent Labs/Studies:   Results for orders placed or performed during the hospital encounter of 11/13/17 (from the past 24 hour(s))  POCT fern test     Status: None   Collection Time: 11/13/17  5:34  AM  Result Value Ref Range   POCT Fern Test Positive = ruptured amniotic membanes   CBC     Status: Abnormal   Collection Time: 11/13/17  5:40 AM  Result Value Ref Range   WBC 13.0 (H) 4.0 - 10.5 K/uL   RBC 4.64 3.87 - 5.11 MIL/uL   Hemoglobin 14.5 12.0 - 15.0 g/dL   HCT 13.241.2 44.036.0 - 10.246.0 %   MCV 88.8 78.0 - 100.0 fL   MCH 31.3 26.0 - 34.0 pg   MCHC 35.2 30.0 - 36.0 g/dL   RDW 72.512.7 36.611.5 - 44.015.5 %   Platelets 162 150 - 400 K/uL  Type and screen [facility]     Status: None   Collection Time: 11/13/17  5:40 AM  Result Value Ref Range   ABO/RH(D) O POS    Antibody Screen  NEG    Sample Expiration 11/16/2017     Assessment : Bonnita LevanCheyenne [name] is a 26 y.o. G2P0010 at 7292w6d being admitted for PROM without labor.  Plan: Labor: Expectant management for now per patient request. Augmentation as needed on reevaluation.   Analgesia as needed. Options discussed FWB: Reassuring fetal heart tracing.   GBS positive - start PCN Delivery plan: Hopeful for vaginal delivery   Caryl AdaJazma Phelps, DO OB Fellow Faculty Practice, [facility] 11/13/2017, 5:37 AM

## 2017-11-13 NOTE — Progress Notes (Addendum)
   Subjective: Debra [name] denies feeling contractions.  Concerned about Foley bulb due to possible pain.  +fetal movement.    Objective: BP 112/77 (BP Location: Right Arm)   Pulse 78   Temp 97.6 F (36.4 C) (Oral)   Resp 16   LMP 02/20/2017 (Exact Date)   SpO2 99%  No intake/output data recorded. No intake/output data recorded.  FHT:  FHR: 132 bpm, variability: moderate,  accelerations:  Present,  decelerations:  Absent UC:   irregular, every 10-20 minutes SVE:   Dilation: 1.5 Effacement (%): 50 Station: -2 Exam by:: (pt request wait for FOB to return)  Labs: Lab Results  Component Value Date   WBC 13.0 (H) 11/13/2017   HGB 14.5 11/13/2017   HCT 41.2 11/13/2017   MCV 88.8 11/13/2017   PLT 162 11/13/2017    Assessment / Plan: 26 y.o. G2P0010 at 1723w6d IUP PROM GBS pos  Labor: PROM; not in labor; plan for Foley Bulb or cytotec Preeclampsia:  n/a Fetal Wellbeing:  Category I Pain Control:  n/a I/D:  GBS pos Anticipated MOD:  NSVD  Debra [name] 11/13/2017, 9:32 AM

## 2017-11-13 NOTE — MAU Note (Signed)
Pt states water broke around 4am-clear fluid. Pt feels some tightening in abdomen and pelvic pain. Pt reports good fetal movement. States cervix was 1.5cm on last exam.

## 2017-11-13 NOTE — Progress Notes (Signed)
Debra [name] is a 26 y.o. G2P0010 at 3076w6d by ultrasound admitted for PROM  Subjective: Pt reports no increase in contractions.  +fetal movement.  Desires either nitrous oxide or epidural for pain relief.    Objective: BP 116/71   Pulse 78   Temp 98 F (36.7 C) (Oral)   Resp 16   LMP 02/20/2017 (Exact Date)   SpO2 99%  No intake/output data recorded. No intake/output data recorded.  FHT:  FHR: 122 bpm, variability: moderate,  accelerations:  Present,  decelerations:  Absent UC:   infrequent SVE:   Dilation: 3.5 Effacement (%): 50 Station: -1 Exam by:: Frank.KiddWalidah, CNM  Labs: Lab Results  Component Value Date   WBC 13.0 (H) 11/13/2017   HGB 14.5 11/13/2017   HCT 41.2 11/13/2017   MCV 88.8 11/13/2017   PLT 162 11/13/2017    Assessment / Plan: 26 y.o. G2P0010 at 8076w6d IUP PROM - Afebrile GBS pos Labor Augmentation  Labor: Latent labor; Begin pitocin augmentation Preeclampsia:  n/a Fetal Wellbeing:  Category I Pain Control:  Labor support without medications I/D:  GBs pos; receiving PCN Anticipated MOD:  NSVD  Rochele PagesWalidah Karim 11/13/2017, 1:55 PM

## 2017-11-13 NOTE — Progress Notes (Addendum)
Debra LevanCheyenne [name] is a 26 y.o. G2P0010 at 5826w6d by ultrasound at 9 wks admitted for rupture of membranes  Subjective: Pt reports FOB has still not returned, should be here within 20 minutes.  Reports feeling additional cramping.  Objective: BP 112/77 (BP Location: Right Arm)   Pulse 78   Temp 97.6 F (36.4 C) (Oral)   Resp 16   LMP 02/20/2017 (Exact Date)   SpO2 99%  No intake/output data recorded. No intake/output data recorded.  FHT:  FHR: 122 bpm, variability: moderate,  accelerations:  Present,  decelerations:  Absent UC:   irregular, every 10-15 minutes SVE:   Dilation: 1.5 Effacement (%): 50 Station: -2 Exam by:: (pt request wait for FOB to return)  Labs: Lab Results  Component Value Date   WBC 13.0 (H) 11/13/2017   HGB 14.5 11/13/2017   HCT 41.2 11/13/2017   MCV 88.8 11/13/2017   PLT 162 11/13/2017    Assessment / Plan: 26 y.o. G2P0010 at 4926w6d IUP PROM - Afebrile GBS pos  Labor: PROM; not in labor Preeclampsia:  n/a Fetal Wellbeing:  Category I Pain Control:  n/a I/D:  GBS pos; PCN as ordered Anticipated MOD:  NSVD  Rochele PagesWalidah Karim 11/13/2017, 12:30 PM

## 2017-11-14 MED ORDER — OXYCODONE HCL 5 MG PO TABS
5.0000 mg | ORAL_TABLET | Freq: Four times a day (QID) | ORAL | Status: DC | PRN
  Administered 2017-11-14: 5 mg via ORAL
  Filled 2017-11-14: qty 1

## 2017-11-14 NOTE — Clinical Social Work Maternal (Signed)
  CLINICAL SOCIAL WORK MATERNAL/CHILD NOTE  Patient Details  Name: Debra [name] MRN: [id number] Date of Birth: 1991/05/31  Date:  11/14/2017  Clinical Social Worker Initiating Note:  Ambrose Pancoast, LCSW Date/Time: Initiated:  11/14/17/      Child's Name:  Barbaraann Faster Paschal   Biological Parents:  Mother, Father   Need for Interpreter:  None   Reason for Referral:  Current Substance Use/Substance Use During Pregnancy    Address:  [place] [address]    Phone number:  [phone] (home)     Additional phone number:  Household Members/Support Persons (HM/SP):       HM/SP Name Relationship DOB or Age  HM/SP -1        HM/SP -2        HM/SP -3        HM/SP -4        HM/SP -5        HM/SP -6        HM/SP -7        HM/SP -8          Natural Supports (not living in the home):  Spouse/significant other, Extended Family, Immediate Family(FOB and his family)   Medical illustrator Supports: None   Employment: Unemployed   Type of Work:     Education:  Programmer, systems   Homebound arranged:    Museum/gallery curator Resources:  Medicaid   Other Resources:  Physicist, medical , [place] Considerations Which May Impact Care: None identified  Strengths:  Ability to meet basic needs , Home prepared for child , Pediatrician chosen   Psychotropic Medications:         Pediatrician:    [employer] area  Pediatrician List:   [facility]  [place]      Pediatrician Fax Number:    Risk Factors/Current Problems:  Substance Use    Cognitive State:  Alert , Goal Oriented , Able to Concentrate    Mood/Affect:  Calm , Comfortable    CSW Assessment: LCSW met with MOB and provided education on reason for CSW referral. MOB states that she was an infrequent marijuana smoker. She reports that she has not used since she found out she was pregnant  around two months in to her pregnancy.  LCSW provided information on UDS for baby and cord blood testing for drug screening purposes. LCSW discussed that if test were positive, then a report would be made to CPS.   MOB stated that she stopped smoking cigarettes about a month ago. LCSW provided information and education on second hand smoke and the newbord. LCSW provided information and education on SIDS.  LCSW provided information and education on PPD.   MOB identified that they have all items of necessity needed for the baby.  LCSW finds no barriers to d/c. LCSW will continue to follow baby's UDS and cord blood for results.   CSW Plan/Description:  CSW Will Continue to Monitor Umbilical Cord Tissue Drug Screen Results and Make Report if Warranted, Other(Awaiting baby UDS results )    Ihor Gully, LCSW 11/14/2017, 5:24 PM

## 2017-11-14 NOTE — Lactation Note (Signed)
This note was copied from a baby's chart. Lactation Consultation Note  Patient Name: Girl Bonnita LevanCheyenne [name] ZOXWR'UToday's Date: 11/14/2017 Reason for consult: Initial assessment;Infant < 6lbs;1st time breastfeeding   Initial consult with first time mom of 15 hour old infant. Infant with 7 BF for 10-40 minutes, 1 spoon feed of 2 cc EBM, 1 void, 3 stools, and 2 emesis episodes in last 24 hours. Infant weight 5 lb 13.8 oz with weight loss of 1% since birth.   Mom reports infant fed well last night and has been sleepy this morning. Enc mom to keep infant STS and to feed 8-12 x in 24 hours at first feeding cues. Enc mom to not allow infant to go longer that 3 hours between feeds due to size.   Mom with small compressible breasts with puffy areola and everted nipples. Colostrum easily expressible. Attempted to awaken infant and latch her to the breast. Infant very sleepy and would awaken some and root but would not latch. Showed mom how to hand express and spoon feed infant 2 cc, she did not awaken and wish to feed at this time. Enc mom to keep infant STS and offer breast with feeding cues. Enc mom to hand express and spoon feed with each feeding to offer infant additional calories.   Mom was given Caring for your LPT infant handout due to infant size. Enc mom and family to keep room lights dim, noise level low and not allow infant to be passed around a lot. Reviewed keeping infant hat on at all time.   BF Resources handout and [facility] brochure given, mom informed of IP/OP Services, BF Support Groups and LC phone #. [facility]. Mom has manual pump in her room.   Mom reports she has no questions/concerns at this time. Enc mom to call out for feeding assistance as needed.    Maternal Data Formula Feeding for Exclusion: No Has patient been taught Hand Expression?: Yes Does the patient have breastfeeding experience prior to this delivery?: No  Feeding Feeding  Type: Breast Fed  LATCH Score Latch: Too sleepy or reluctant, no latch achieved, no sucking elicited.  Audible Swallowing: None  Type of Nipple: Everted at rest and after stimulation  Comfort (Breast/Nipple): Soft / non-tender  Hold (Positioning): No assistance needed to correctly position infant at breast.  LATCH Score: 6  Interventions Interventions: Breast feeding basics reviewed;Support pillows;Assisted with latch;Position options;Skin to skin;Expressed milk;Breast massage;Breast compression  Lactation Tools Discussed/Used WIC Program: Yes   Consult Status Consult Status: Follow-up Date: 11/15/17 Follow-up type: In-patient    Silas FloodSharon S Bernice Mcauliffe 11/14/2017, 12:51 PM

## 2017-11-14 NOTE — Lactation Note (Signed)
This note was copied from a baby's chart. Lactation Consultation Note  Patient Name: Debra Bonnita LevanCheyenne Uplinger ZOXWR'UToday's Date: 11/14/2017 Reason for consult: Follow-up assessment;Difficult latch;Infant < 6lbs   Follow up with mom of 21 hour old infant. Mom asked for assistance as she was unable to get infant to sustain latch.   Assisted mom with latching infant to the breast, infant on and off the breast. Burped infant and spoon fed her 1 cc colostrum, infant then latched and fed for 10 minutes. Enc mom to stimulate infant as needed and to maintain acitvei feeding Enc mom to massage/compress breast throughout feeding. Worked with mom on hand expression and able to express colostrum easily.   Infant just had a large stool. No urine was noted in diaper. New cotton balls placed in diaper, notified Wallie RenshawNatalie Branch, RN.   DEBP set up with instructions for assembling, disassembling and cleaning of pump parts. Enc mom to pump with DEBP every 2-3 hours post BF for 15 minutes. Advised parents to feed all EBM to infant as obtained. Reviewed storage of EBM at room temperature. Reviewed with parents that if weight is down, infant not feeding well at the breast, or not transferring well from the breast and not able to obtain colostrum for supplement, infant may need to receive formula if medically indicated, parents voiced understanding. Reviewed Goal is to feed infant only EBM.   Mom reports she has been a smoker but is not smoking now, advised mom not to smoke within 30 minutes of BF as can inhibit letdown. Mom voiced understanding.    Enc parents to continue to feed at least every 3 hours, awakening as needed. Enc parents to call out for feeding assistance as needed.      Maternal Data Formula Feeding for Exclusion: No Has patient been taught Hand Expression?: Yes Does the patient have breastfeeding experience prior to this delivery?: No  Feeding Feeding Type: Breast Fed Length of feed: 10 min  LATCH  Score Latch: Repeated attempts needed to sustain latch, nipple held in mouth throughout feeding, stimulation needed to elicit sucking reflex.  Audible Swallowing: A few with stimulation  Type of Nipple: Everted at rest and after stimulation  Comfort (Breast/Nipple): Soft / non-tender  Hold (Positioning): Assistance needed to correctly position infant at breast and maintain latch.  LATCH Score: 7  Interventions Interventions: Breast feeding basics reviewed;Support pillows;Assisted with latch;Position options;Skin to skin;Expressed milk;Breast massage;Breast compression  Lactation Tools Discussed/Used Pump Review: Setup, frequency, and cleaning;Milk Storage Initiated by:: Noralee StainSharon Briahna Pescador, RN, IBCLC Date initiated:: 11/14/17   Consult Status Consult Status: Follow-up Date: 11/15/17 Follow-up type: In-patient    Silas FloodSharon S Jeymi Hepp 11/14/2017, 6:59 PM

## 2017-11-14 NOTE — Progress Notes (Signed)
Resident on call notified of Patient's intense sacral pain. No further orders. Just offer pain medicine as needed. Patient refused narcotics at this time. She will let me know.

## 2017-11-14 NOTE — Progress Notes (Signed)
Post Partum Day 1 Subjective: Subjective: No problems or concerns. Patient states doing well. Reports minimal bleeding and pain controlled with pain medications. Denies calf pain.  Breastfeeding.    Objective: Blood pressure (!) 100/55, pulse 60, temperature 98 F (36.7 C), temperature source Oral, resp. rate 17, last menstrual period 02/20/2017, SpO2 99 %, unknown if currently breastfeeding.  Physical Exam:  General: alert, cooperative and appears stated age Lochia: appropriate Uterine Fundus: firm Incision: n/a DVT Evaluation: No evidence of DVT seen on physical exam. Negative Homan's sign.  Recent Labs    11/13/17 0540  HGB 14.5  HCT 41.2    Assessment/Plan: Plan for discharge tomorrow, Breastfeeding and Contraception POPs   LOS: 1 day   Debra [name] 11/14/2017, 9:07 AM

## 2017-11-15 ENCOUNTER — Encounter: Payer: Medicaid Other | Admitting: Advanced Practice Midwife

## 2017-11-15 ENCOUNTER — Other Ambulatory Visit: Payer: Medicaid Other

## 2017-11-15 LAB — BIRTH TISSUE RECOVERY COLLECTION (PLACENTA DONATION)

## 2017-11-15 MED ORDER — IBUPROFEN 600 MG PO TABS
600.0000 mg | ORAL_TABLET | Freq: Four times a day (QID) | ORAL | 0 refills | Status: DC
Start: 2017-11-15 — End: 2017-12-26

## 2017-11-15 MED ORDER — SENNOSIDES-DOCUSATE SODIUM 8.6-50 MG PO TABS: 2.0000 | ORAL_TABLET | Freq: Every evening | ORAL | 0 refills | Status: DC | PRN

## 2017-11-15 NOTE — Plan of Care (Signed)
POC and discharge education discussed with pt.  NO questions or concerns at this time.

## 2017-11-15 NOTE — Lactation Note (Signed)
This note was copied from a baby's chart. Lactation Consultation Note  Patient Name: Debra [name]: 11/15/2017 Reason for consult: Follow-up assessment;Mother's request   [facility] [place]. WIC loaner pump rental completed.    Maternal Data    Feeding    LATCH Score                   Interventions    Lactation Tools Discussed/Used WIC Program: Yes   Consult Status Consult Status: Follow-up Follow-up type: Out-patient    [name] 11/15/2017, 3:46 PM

## 2017-11-15 NOTE — Discharge Instructions (Signed)

## 2017-11-15 NOTE — Discharge Summary (Signed)
OB Discharge Summary     Patient Name: Debra LevanCheyenne [name] DOB: Mar 29, 1991 MRN: [id number]  Date of admission: 11/13/2017 Delivering MD: Marlis EdelsonKARIM, WALIDAH N   Date of discharge: 11/15/2017  Admitting diagnosis: 39wks water broke  Intrauterine pregnancy: 5948w6d     Secondary diagnosis:  Active Problems:   PROM (premature rupture of membranes)  Additional problems:  Patient Active Problem List   Diagnosis Date Noted  . PROM (premature rupture of membranes) 11/13/2017  . IUGR (intrauterine growth restriction) affecting care of mother 10/16/2017  . Asymptomatic bacteriuria during pregnancy in first trimester 05/07/2017  . 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 Supervision of high risk pregnancy, antepartum 05/02/2017  . Smoker 05/02/2017      Discharge diagnosis: Term Pregnancy Delivered                                                                                                Post partum procedures:None  Augmentation: Pitocin and Foley Balloon  Complications: None  Hospital course:  Onset of Labor With Vaginal Delivery     26 y.o. yo G2P1011 at 5548w6d was admitted in Latent Labor on 11/13/2017. Patient had an uncomplicated labor course as follows:  Membrane Rupture Time/Date: 4:00 AM ,11/13/2017   Intrapartum Procedures: Episiotomy: None [1]                                         Lacerations:  1st degree [2];Labial [10]  Patient had a delivery of a Viable infant. 11/13/2017  Information for the patient's newborn:  Dan MakerOldham, Girl Lue [[id number]][[id number]]  Delivery Method: Vaginal, Spontaneous(Filed from Delivery Summary)    Pateint had an uncomplicated postpartum course.  She is ambulating, tolerating a regular diet, passing flatus, and urinating well. Patient is discharged home in stable condition on 11/15/17.   Physical exam  Vitals:   11/14/17 0431 11/14/17 0951 11/14/17 1119 11/14/17 1828  BP: (!) 100/55  107/68 110/73  Pulse: 60  80 84  Resp: 17  18 20   Temp:   97.6 F (36.4 C)  98.1 F (36.7 C)  TempSrc:    Oral  SpO2:   99%   Weight:  71 kg (156 lb 8 oz)     General: alert, cooperative and no distress Lochia: appropriate Uterine Fundus: firm Incision: N/A DVT Evaluation: No evidence of DVT seen on physical exam. Labs: Lab Results  Component Value Date   WBC 13.0 (H) 11/13/2017   HGB 14.5 11/13/2017   HCT 41.2 11/13/2017   MCV 88.8 11/13/2017   PLT 162 11/13/2017   No flowsheet data found.  Discharge instruction: per After Visit Summary and "Baby and Me Booklet".  After visit meds:  Allergies as of 11/15/2017      Reactions   Bee Venom Anaphylaxis, Hives      Medication List    STOP taking these medications   CHLORASEPTIC MAX SORE THROAT 1.5-33 % Liqd Generic drug:  Phenol-Glycerin     TAKE these medications   ibuprofen 600 MG tablet Commonly  known as:  ADVIL,MOTRIN Take 1 tablet (600 mg total) by mouth every 6 (six) hours.   PNV PRENATAL PLUS MULTIVITAMIN 27-1 MG Tabs Take 1 tablet by mouth daily.   senna-docusate 8.6-50 MG tablet Commonly known as:  Senokot-S Take 2 tablets by mouth at bedtime as needed for mild constipation.   sodium chloride 0.65 % Soln nasal spray Commonly known as:  OCEAN Place 1 spray into both nostrils as needed for congestion.       Diet: routine diet  Activity: Advance as tolerated. Pelvic rest for 6 weeks.   Outpatient follow up:4 weeks Follow up Appt: Future Appointments  Date Time Provider Department Center  12/14/2017 10:00 AM Cheral MarkerBooker, Kimberly R, CNM [facility]   Follow up Visit:No Follow-up on file.  Postpartum contraception: Progesterone only pills  Newborn Data: Live born female  Birth Weight: 5 lb 14.7 oz (2685 g) APGAR: 9, 9  Newborn Delivery   Birth date/time:  11/13/2017 21:18:00 Delivery type:  Vaginal, Spontaneous     Baby Feeding: Breast Disposition:home with mother   11/15/2017 Caryl AdaJazma Flor Whitacre, DO

## 2017-11-15 NOTE — Lactation Note (Signed)
This note was copied from a baby's chart. Lactation Consultation Note  Patient Name: Girl Bonnita LevanCheyenne Werntz ZDGUY'QToday's Date: 11/15/2017 Reason for consult: Follow-up assessment;Infant < 6lbs;Term   Follow up with first time mom of 35 hour old < 6 lb infant. Infant with 4 BF for 10-30 minutes, 4 BF attempts, EBM x 3 of 1-5 cc, 2 voids and 5 stools in the last 24 hours. Infant weight 5 lb 9.1 oz with 6% weight loss since birth (5% weight loss in the last 24 hours). LATCH scores 6-9.   Mom reports infant cluster fed during the night. Mom reports infant latched better last night than yesterday. Mom reports she is massaging/compressing breast with feedings.   Mom is pumping with DEBP and getting small amounts of colostrum that are being fed to infant.   Advised parents to make sure infant is fed 8-12 x in 24 hours at first feeding cues with no longer that 3 hours between feedings. Advised mom to continue pumping at least every 3 hours and follow with hand expression and to feed all EBM to infant after BF.   [facility] [place] She has spoken with then about a WIC pump and was told she could get one to return to work. [facility] for mom to continue supplementation for the infant based on size. Parents report [facility]. [facility] in the room if mom not able to get pump [facility] and is to be d/c home.   Reviewed I/O, signs of dehydration in the infant, engorgement prevention/treatment, and breast milk expression and storage.  [facility] given, mom informed of OP services, BF Support Groups and LC phone #. Mom agreeable to returning for OP appt next week. In basket message sent to OP clinic to call mom and schedule OP appt.   Mom to call out for feeding assistance as needed today. Mom able to verbalize feeding plan for infant. Mom reports no further questions/concerns at this time.        Maternal  Data Formula Feeding for Exclusion: No Has patient been taught Hand Expression?: Yes Does the patient have breastfeeding experience prior to this delivery?: No  Feeding    LATCH Score                   Interventions    Lactation Tools Discussed/Used WIC Program: Yes Pump Review: Setup, frequency, and cleaning;Milk Storage Initiated by:: Reviewed and encouraged every 2-3 hours post BF   Consult Status Consult Status: Follow-up Date: 11/16/17 Follow-up type: In-patient    Silas FloodSharon S Onita Pfluger 11/15/2017, 9:14 AM

## 2017-11-18 ENCOUNTER — Other Ambulatory Visit: Payer: Self-pay

## 2017-11-18 ENCOUNTER — Inpatient Hospital Stay (HOSPITAL_COMMUNITY)
Admission: AD | Admit: 2017-11-18 | Discharge: 2017-11-18 | Disposition: A | Discharge status: Home / Self Care | Payer: Medicaid Other | Source: Ambulatory Visit | Attending: Obstetrics & Gynecology | Admitting: Obstetrics & Gynecology

## 2017-11-18 ENCOUNTER — Encounter (HOSPITAL_COMMUNITY): Payer: Self-pay

## 2017-11-18 DIAGNOSIS — O99893 Other specified diseases and conditions complicating puerperium: Secondary | ICD-10-CM

## 2017-11-18 DIAGNOSIS — N9489 Other specified conditions associated with female genital organs and menstrual cycle: Secondary | ICD-10-CM

## 2017-11-18 DIAGNOSIS — M7989 Other specified soft tissue disorders: Secondary | ICD-10-CM | POA: Diagnosis not present

## 2017-11-18 DIAGNOSIS — M25472 Effusion, left ankle: Secondary | ICD-10-CM

## 2017-11-18 DIAGNOSIS — Z91030 Bee allergy status: Secondary | ICD-10-CM | POA: Insufficient documentation

## 2017-11-18 DIAGNOSIS — R109 Unspecified abdominal pain: Secondary | ICD-10-CM | POA: Diagnosis not present

## 2017-11-18 DIAGNOSIS — Z9889 Other specified postprocedural states: Secondary | ICD-10-CM | POA: Insufficient documentation

## 2017-11-18 DIAGNOSIS — Z87891 Personal history of nicotine dependence: Secondary | ICD-10-CM | POA: Insufficient documentation

## 2017-11-18 DIAGNOSIS — G8918 Other acute postprocedural pain: Secondary | ICD-10-CM

## 2017-11-18 LAB — URINALYSIS, ROUTINE W REFLEX MICROSCOPIC
BILIRUBIN URINE: NEGATIVE
Bacteria, UA: NONE SEEN
Glucose, UA: NEGATIVE mg/dL
Ketones, ur: NEGATIVE mg/dL
NITRITE: NEGATIVE
PH: 5 (ref 5.0–8.0)
Protein, ur: 100 mg/dL — AB
SPECIFIC GRAVITY, URINE: 1.032 — AB (ref 1.005–1.030)

## 2017-11-18 MED ORDER — ACETAMINOPHEN 500 MG PO TABS
1000.0000 mg | ORAL_TABLET | Freq: Once | ORAL | Status: AC
  Administered 2017-11-18: 1000 mg via ORAL
  Filled 2017-11-18: qty 2

## 2017-11-18 NOTE — MAU Provider Note (Signed)
Chief Complaint: Abdominal Pain and Leg Swelling   First Provider Initiated Contact with Patient 11/18/17 1307      SUBJECTIVE HPI: Debra [name] is a 26 y.o. G2P1011 PP vaginal patient that delivered on 12/25 who presents to maternity admissions reporting abdominal pain and ankle swelling. She reports that she called the nurse line yesterday when she first noticed swelling of her left ankle and was told to elevated her legs when sitting. Woke up this morning with no swelling in her legs and ankle. Abdominal pain has been intermittent cramping since delivery. Patient has taken Motrin for pain with mild relief, rates pain 5/10 last night when pain was the worse, rates pain 3/10 currently. She states that since she has taken the Motrin this morning her pain has been decreasing. She denies constipation, diarrhea and states last BM was last night. She denies vaginal itching/burning, urinary symptoms, h/a, dizziness, n/v, or fever/chills. Patent reports vaginal bleeding due to 5 days PP.   Past Medical History:  Diagnosis Date  . Contraceptive management 11/27/2014  . Encounter for IUD removal 12/04/2014  . IUD (intrauterine device) in place 11/27/2014   Past Surgical History:  Procedure Laterality Date  . HERNIA REPAIR Bilateral 1998  . WISDOM TOOTH EXTRACTION     Social History   Socioeconomic History  . Marital status: Single    Spouse name: Not on file  . Number of children: Not on file  . Years of education: Not on file  . Highest education level: Not on file  Social Needs  . Financial resource strain: Not on file  . Food insecurity - worry: Not on file  . Food insecurity - inability: Not on file  . Transportation needs - medical: Not on file  . Transportation needs - non-medical: Not on file  Occupational History  . Not on file  Tobacco Use  . Smoking status: Former Smoker    Packs/day: 0.10    Years: 8.00    Pack years: 0.80    Types: Cigarettes    Last attempt to quit:  10/03/2017    Years since quitting: 0.1  . Smokeless tobacco: Never Used  . Tobacco comment: occasional  Substance and Sexual Activity  . Alcohol use: No    Comment: occ  . Drug use: No  . Sexual activity: Yes    Birth control/protection: None  Other Topics Concern  . Not on file  Social History Narrative  . Not on file   No current facility-administered medications on file prior to encounter.    Current Outpatient Medications on File Prior to Encounter  Medication Sig Dispense Refill  . ibuprofen (ADVIL,MOTRIN) 600 MG tablet Take 1 tablet (600 mg total) by mouth every 6 (six) hours. 30 tablet 0  . Prenatal Vit-Fe Fumarate-FA (PNV PRENATAL PLUS MULTIVITAMIN) 27-1 MG TABS Take 1 tablet by mouth daily. 30 tablet 11  . senna-docusate (SENOKOT-S) 8.6-50 MG tablet Take 2 tablets by mouth at bedtime as needed for mild constipation. 60 tablet 0  . sodium chloride (OCEAN) 0.65 % SOLN nasal spray Place 1 spray into both nostrils as needed for congestion.     Allergies  Allergen Reactions  . Bee Venom Anaphylaxis and Hives    ROS:  Review of Systems  Constitutional: Negative.   Respiratory: Negative.   Cardiovascular: Negative.   Gastrointestinal: Positive for abdominal pain. Negative for constipation, diarrhea, nausea and vomiting.  Genitourinary: Negative.   Musculoskeletal:       Left ankle swelling   Neurological:  Negative.   Psychiatric/Behavioral: Negative.    I have reviewed patient's Past Medical Hx, Surgical Hx, Family Hx, Social Hx, medications and allergies.   Physical Exam   Patient Vitals for the past 24 hrs:  BP Pulse  11/18/17 1301 108/70 80  11/18/17 1300 113/79 83   Constitutional: Well-developed, well-nourished female in no acute distress.  Cardiovascular: normal rate Respiratory: normal effort GI: Abd soft, non-tender. Pos BS x 4 MS: Extremities nontender, no edema noted bilaterally, normal ROM Neurologic: Alert and oriented x 4.  GU: Neg CVAT.  LAB  RESULTS Results for orders placed or performed during the hospital encounter of 11/18/17 (from the past 24 hour(s))  Urinalysis, Routine w reflex microscopic     Status: Abnormal   Collection Time: 11/18/17 12:41 PM  Result Value Ref Range   Color, Urine YELLOW YELLOW   APPearance HAZY (A) CLEAR   Specific Gravity, Urine 1.032 (H) 1.005 - 1.030   pH 5.0 5.0 - 8.0   Glucose, UA NEGATIVE NEGATIVE mg/dL   Hgb urine dipstick MODERATE (A) NEGATIVE   Bilirubin Urine NEGATIVE NEGATIVE   Ketones, ur NEGATIVE NEGATIVE mg/dL   Protein, ur 161100 (A) NEGATIVE mg/dL   Nitrite NEGATIVE NEGATIVE   Leukocytes, UA MODERATE (A) NEGATIVE   RBC / HPF TOO NUMEROUS TO COUNT 0 - 5 RBC/hpf   WBC, UA TOO NUMEROUS TO COUNT 0 - 5 WBC/hpf   Bacteria, UA NONE SEEN NONE SEEN   Squamous Epithelial / LPF 0-5 (A) NONE SEEN   Mucus PRESENT     --/--/O POS, O POS (12/25 0540)  MAU Management/MDM: Orders Placed This Encounter  Procedures  . Urine Culture  . Urinalysis, Routine w reflex microscopic    Meds ordered this encounter  Medications  . acetaminophen (TYLENOL) tablet 1,000 mg    Treatments in MAU included 1,000mg  Tylenol for pain- relief of pain with medication. Educated on the use and rotation of Motrin and Tylenol for pain control. Discussed Involution over the weeks to come and normality of cramping while involution occurs. Pt discharged with instructions to follow up as scheduled and return to MAU for emergencies.  ASSESSMENT 1. Pain related to uterine involution   2. Ankle swelling, left     PLAN Discharge home Follow up as scheduled and return to MAU for emergencies  Motrin and Tylenol for pain control   Allergies as of 11/18/2017      Reactions   Bee Venom Anaphylaxis, Hives      Medication List    TAKE these medications   ibuprofen 600 MG tablet Commonly known as:  ADVIL,MOTRIN Take 1 tablet (600 mg total) by mouth every 6 (six) hours. What changed:    when to take  this  reasons to take this   PNV PRENATAL PLUS MULTIVITAMIN 27-1 MG Tabs Take 1 tablet by mouth daily.   senna-docusate 8.6-50 MG tablet Commonly known as:  Senokot-S Take 2 tablets by mouth at bedtime as needed for mild constipation.       Steward DroneVeronica Gerren Hoffmeier  Certified Nurse-Midwife 11/18/2017  1:18 PM

## 2017-11-18 NOTE — Progress Notes (Addendum)
PP that delivered vaginal on christmas day. Presents to triage for ankle swelling and abd pain. Pt is breast feeding but states 1st time feeling the abd pain today.   NO ankle swelling noted. Pt states called yesterday and person on phone told her to elevate the legs   1340: tylenol administered

## 2017-11-19 LAB — URINE CULTURE
Culture: <10000 — AB
Special Requests: NORMAL

## 2017-12-07 ENCOUNTER — Encounter (HOSPITAL_COMMUNITY): Payer: Self-pay | Admitting: *Deleted

## 2017-12-07 ENCOUNTER — Other Ambulatory Visit: Payer: Self-pay

## 2017-12-07 ENCOUNTER — Inpatient Hospital Stay (HOSPITAL_COMMUNITY)
Admission: AD | Admit: 2017-12-07 | Discharge: 2017-12-07 | Disposition: A | Discharge status: Home / Self Care | Payer: Medicaid Other | Source: Ambulatory Visit | Attending: Obstetrics and Gynecology | Admitting: Obstetrics and Gynecology

## 2017-12-07 DIAGNOSIS — Z9889 Other specified postprocedural states: Secondary | ICD-10-CM | POA: Diagnosis not present

## 2017-12-07 DIAGNOSIS — Z8249 Family history of ischemic heart disease and other diseases of the circulatory system: Secondary | ICD-10-CM | POA: Insufficient documentation

## 2017-12-07 DIAGNOSIS — O9989 Other specified diseases and conditions complicating pregnancy, childbirth and the puerperium: Secondary | ICD-10-CM | POA: Diagnosis not present

## 2017-12-07 DIAGNOSIS — Z825 Family history of asthma and other chronic lower respiratory diseases: Secondary | ICD-10-CM | POA: Diagnosis not present

## 2017-12-07 DIAGNOSIS — Z801 Family history of malignant neoplasm of trachea, bronchus and lung: Secondary | ICD-10-CM | POA: Diagnosis not present

## 2017-12-07 DIAGNOSIS — R030 Elevated blood-pressure reading, without diagnosis of hypertension: Secondary | ICD-10-CM

## 2017-12-07 DIAGNOSIS — Z87891 Personal history of nicotine dependence: Secondary | ICD-10-CM | POA: Insufficient documentation

## 2017-12-07 DIAGNOSIS — O9089 Other complications of the puerperium, not elsewhere classified: Secondary | ICD-10-CM | POA: Insufficient documentation

## 2017-12-07 DIAGNOSIS — R9431 Abnormal electrocardiogram [ECG] [EKG]: Secondary | ICD-10-CM | POA: Diagnosis not present

## 2017-12-07 DIAGNOSIS — Z91030 Bee allergy status: Secondary | ICD-10-CM | POA: Diagnosis not present

## 2017-12-07 DIAGNOSIS — R079 Chest pain, unspecified: Secondary | ICD-10-CM | POA: Diagnosis present

## 2017-12-07 DIAGNOSIS — Z809 Family history of malignant neoplasm, unspecified: Secondary | ICD-10-CM | POA: Diagnosis not present

## 2017-12-07 DIAGNOSIS — R600 Localized edema: Secondary | ICD-10-CM | POA: Insufficient documentation

## 2017-12-07 DIAGNOSIS — M7989 Other specified soft tissue disorders: Secondary | ICD-10-CM | POA: Diagnosis present

## 2017-12-07 LAB — CBC WITH DIFFERENTIAL/PLATELET
Basophils Absolute: 0 10*3/uL (ref 0.0–0.1)
Basophils Relative: 0 %
EOS ABS: 0.3 10*3/uL (ref 0.0–0.7)
EOS PCT: 4 %
HCT: 34.2 % — ABNORMAL LOW (ref 36.0–46.0)
Hemoglobin: 11.8 g/dL — ABNORMAL LOW (ref 12.0–15.0)
LYMPHS ABS: 3 10*3/uL (ref 0.7–4.0)
LYMPHS PCT: 36 %
MCH: 31.1 pg (ref 26.0–34.0)
MCHC: 34.5 g/dL (ref 30.0–36.0)
MCV: 90 fL (ref 78.0–100.0)
MONO ABS: 0.3 10*3/uL (ref 0.1–1.0)
MONOS PCT: 3 %
Neutro Abs: 4.6 10*3/uL (ref 1.7–7.7)
Neutrophils Relative %: 57 %
PLATELETS: 256 10*3/uL (ref 150–400)
RBC: 3.8 MIL/uL — ABNORMAL LOW (ref 3.87–5.11)
RDW: 12.5 % (ref 11.5–15.5)
WBC: 8.2 10*3/uL (ref 4.0–10.5)

## 2017-12-07 LAB — COMPREHENSIVE METABOLIC PANEL
ALT: 14 U/L (ref 14–54)
ANION GAP: 8 (ref 5–15)
AST: 22 U/L (ref 15–41)
Albumin: 3.4 g/dL — ABNORMAL LOW (ref 3.5–5.0)
Alkaline Phosphatase: 75 U/L (ref 38–126)
BUN: 10 mg/dL (ref 6–20)
CHLORIDE: 105 mmol/L (ref 101–111)
CO2: 25 mmol/L (ref 22–32)
Calcium: 8.5 mg/dL — ABNORMAL LOW (ref 8.9–10.3)
Creatinine, Ser: 0.61 mg/dL (ref 0.44–1.00)
Glucose, Bld: 91 mg/dL (ref 65–99)
POTASSIUM: 3.6 mmol/L (ref 3.5–5.1)
Sodium: 138 mmol/L (ref 135–145)
Total Bilirubin: 0.4 mg/dL (ref 0.3–1.2)
Total Protein: 6.5 g/dL (ref 6.5–8.1)

## 2017-12-07 LAB — URINALYSIS, ROUTINE W REFLEX MICROSCOPIC
Bilirubin Urine: NEGATIVE
Glucose, UA: NEGATIVE mg/dL
Ketones, ur: NEGATIVE mg/dL
NITRITE: NEGATIVE
Protein, ur: NEGATIVE mg/dL
SPECIFIC GRAVITY, URINE: 1.005 (ref 1.005–1.030)
pH: 7 (ref 5.0–8.0)

## 2017-12-07 LAB — PROTEIN / CREATININE RATIO, URINE: CREATININE, URINE: 30 mg/dL

## 2017-12-07 NOTE — MAU Note (Signed)
Ankles swollen for last couple days. When I lay flat feel pressure on chest but when I sit up I do not have chest pain. Breastfeeding and going well

## 2017-12-07 NOTE — MAU Provider Note (Signed)
History     CSN: [id number]  Arrival date and time: 12/07/17 2143   First Provider Initiated Contact with Patient 12/07/17 2232      Chief Complaint  Patient presents with  . Foot Swelling  . Chest Pain   HPI  [name] is a 27 y.o. G2P1011 who delivered on 11/13/17 presents to MAU today with complaint of chest pain and LE edema. The patient was seen for edema on 12/30 as well. She states that she had a normal pregnancy without any complications. No PP complications either. She states chest pain is mild and only with laying flat. She denies pain now. She states headache earlier today resolved with ibuprofen. She denies RUQ pain. She has had mild LE edema bilaterally that often resolves with rest and elevation. It is equal and non-painful.   OB History    Gravida Para Term Preterm AB Living   2 1 1   1 1    SAB TAB Ectopic Multiple Live Births     1   0 1      Past Medical History:  Diagnosis Date  . Contraceptive management 11/27/2014  . Encounter for IUD removal 12/04/2014  . IUD (intrauterine device) in place 11/27/2014    Past Surgical History:  Procedure Laterality Date  . HERNIA REPAIR Bilateral 1998  . WISDOM TOOTH EXTRACTION      Family History  Problem Relation Age of Onset  . Healthy Mother   . Healthy Father   . Asthma Brother   . Other Maternal Grandmother        MVA  . Other Maternal Grandfather        fibrosis of lungs  . Cancer Paternal Grandmother   . COPD Paternal Grandmother   . Cancer Paternal Grandfather        lung   . Heart attack Maternal Uncle     Social History   Tobacco Use  . Smoking status: Former Smoker    Packs/day: 0.10    Years: 8.00    Pack years: 0.80    Types: Cigarettes    Last attempt to quit: 10/03/2017    Years since quitting: 0.1  . Smokeless tobacco: Never Used  . Tobacco comment: occasional  Substance Use Topics  . Alcohol use: No    Comment: occ  . Drug use: No    Allergies:  Allergies  Allergen  Reactions  . Bee Venom Anaphylaxis and Hives    Medications Prior to Admission  Medication Sig Dispense Refill Last Dose  . ibuprofen (ADVIL,MOTRIN) 600 MG tablet Take 1 tablet (600 mg total) by mouth every 6 (six) hours. (Patient taking differently: Take 600 mg by mouth every 6 (six) hours as needed for moderate pain. ) 30 tablet 0 11/18/2017 at Unknown time  . Prenatal Vit-Fe Fumarate-FA (PNV PRENATAL PLUS MULTIVITAMIN) 27-1 MG TABS Take 1 tablet by mouth daily. 30 tablet 11 11/17/2017 at Unknown time  . senna-docusate (SENOKOT-S) 8.6-50 MG tablet Take 2 tablets by mouth at bedtime as needed for mild constipation. 60 tablet 0 11/18/2017 at Unknown time    Review of Systems  Constitutional: Negative for fever.  Eyes: Negative for photophobia.  Cardiovascular: Positive for leg swelling.  Gastrointestinal: Negative for abdominal pain, constipation, diarrhea, nausea and vomiting.  Genitourinary: Positive for vaginal bleeding.  Neurological: Positive for headaches.   Physical Exam   Blood pressure (!) 143/89, pulse (!) 48, temperature 97.6 F (36.4 C), resp. rate 18, height 5\' 8"  (1.727 m), weight  154 lb (69.9 kg), SpO2 100 %, currently breastfeeding.  Physical Exam  Nursing note and vitals reviewed. Constitutional: She is oriented to person, place, and time. She appears well-developed and well-nourished. No distress.  HENT:  Head: Normocephalic and atraumatic.  Cardiovascular: Normal rate, regular rhythm and normal heart sounds.  Respiratory: Effort normal and breath sounds normal. No respiratory distress. She has no wheezes. She has no rales.  GI: Soft. She exhibits no distension. There is no tenderness.  Musculoskeletal: She exhibits no edema.  Neurological: She is alert and oriented to person, place, and time. She has normal reflexes.  No clonus  Skin: Skin is warm and dry. No erythema.  Psychiatric: She has a normal mood and affect.    Results for orders placed or performed  during the hospital encounter of 12/07/17 (from the past 24 hour(s))  Protein / creatinine ratio, urine     Status: None   Collection Time: 12/07/17 10:00 PM  Result Value Ref Range   Creatinine, Urine 30.00 mg/dL   Total Protein, Urine <6 mg/dL   Protein Creatinine Ratio        0.00 - 0.15 mg/mg[Cre]  Urinalysis, Routine w reflex microscopic     Status: Abnormal   Collection Time: 12/07/17 10:00 PM  Result Value Ref Range   Color, Urine STRAW (A) YELLOW   APPearance CLEAR CLEAR   Specific Gravity, Urine 1.005 1.005 - 1.030   pH 7.0 5.0 - 8.0   Glucose, UA NEGATIVE NEGATIVE mg/dL   Hgb urine dipstick SMALL (A) NEGATIVE   Bilirubin Urine NEGATIVE NEGATIVE   Ketones, ur NEGATIVE NEGATIVE mg/dL   Protein, ur NEGATIVE NEGATIVE mg/dL   Nitrite NEGATIVE NEGATIVE   Leukocytes, UA MODERATE (A) NEGATIVE   RBC / HPF 0-5 0 - 5 RBC/hpf   WBC, UA 0-5 0 - 5 WBC/hpf   Bacteria, UA RARE (A) NONE SEEN   Squamous Epithelial / LPF 0-5 (A) NONE SEEN   Mucus PRESENT   CBC with Differential/Platelet     Status: Abnormal   Collection Time: 12/07/17 10:07 PM  Result Value Ref Range   WBC 8.2 4.0 - 10.5 K/uL   RBC 3.80 (L) 3.87 - 5.11 MIL/uL   Hemoglobin 11.8 (L) 12.0 - 15.0 g/dL   HCT 96.0 (L) 45.4 - 09.8 %   MCV 90.0 78.0 - 100.0 fL   MCH 31.1 26.0 - 34.0 pg   MCHC 34.5 30.0 - 36.0 g/dL   RDW 11.9 14.7 - 82.9 %   Platelets 256 150 - 400 K/uL   Neutrophils Relative % 57 %   Neutro Abs 4.6 1.7 - 7.7 K/uL   Lymphocytes Relative 36 %   Lymphs Abs 3.0 0.7 - 4.0 K/uL   Monocytes Relative 3 %   Monocytes Absolute 0.3 0.1 - 1.0 K/uL   Eosinophils Relative 4 %   Eosinophils Absolute 0.3 0.0 - 0.7 K/uL   Basophils Relative 0 %   Basophils Absolute 0.0 0.0 - 0.1 K/uL  Comprehensive metabolic panel     Status: Abnormal   Collection Time: 12/07/17 10:07 PM  Result Value Ref Range   Sodium 138 135 - 145 mmol/L   Potassium 3.6 3.5 - 5.1 mmol/L   Chloride 105 101 - 111 mmol/L   CO2 25 22 - 32 mmol/L    Glucose, Bld 91 65 - 99 mg/dL   BUN 10 6 - 20 mg/dL   Creatinine, Ser 5.62 0.44 - 1.00 mg/dL   Calcium 8.5 (L) 8.9 -  10.3 mg/dL   Total Protein 6.5 6.5 - 8.1 g/dL   Albumin 3.4 (L) 3.5 - 5.0 g/dL   AST 22 15 - 41 U/L   ALT 14 14 - 54 U/L   Alkaline Phosphatase 75 38 - 126 U/L   Total Bilirubin 0.4 0.3 - 1.2 mg/dL   GFR calc non Af Amer >60 >60 mL/min   GFR calc Af Amer >60 >60 mL/min   Anion gap 8 5 - 15   Patient Vitals for the past 24 hrs:  BP Temp Pulse Resp SpO2 Height Weight  12/07/17 2316 (!) 143/89 - (!) 48 - - - -  12/07/17 2301 140/77 - (!) 50 - - - -  12/07/17 2246 (!) 149/95 - (!) 50 - - - -  12/07/17 2239 (!) 154/93 - (!) 53 - - - -  12/07/17 2156 (!) 157/94 - - - - - -  12/07/17 2154 - 97.6 F (36.4 C) (!) 47 18 100 % 5\' 8"  (1.727 m) 154 lb (69.9 kg)     MAU Course  Procedures  EKG - sinus bradycardia, left axis deviation, T wave abnormality  MDM UA, urine protein/creatinine ratio, CBC and CMP today  Serials BPs Intermittent chest pain without SOB or tachycardia, tachypnea noted makes likelihood or PE very low. Patient is well-appearing. No evidence of DVT or pre-eclampsia today.  Discussed patient with Dr. Emelda FearFerguson who recommends follow-up for BP check Monday   Assessment and Plan  A: PP Elevated blood pressure Mild LE edema  P: Discharge home Ibuprofen or Tylenol for headache PRN Warning signs for worsening condition discussed Patient advised to follow-up with [facility] on Monday for BP check Patient may return to MAU as needed or if her condition were to change or worsen  Vonzella NippleJulie Dorise Gangi, PA-C 12/07/2017, 11:34 PM

## 2017-12-07 NOTE — Discharge Instructions (Signed)
Preeclampsia and Eclampsia °Preeclampsia is a serious condition that develops only during pregnancy. It is also called toxemia of pregnancy. This condition causes high blood pressure along with other symptoms, such as swelling and headaches. These symptoms may develop as the condition gets worse. Preeclampsia may occur at 20 weeks of pregnancy or later. °Diagnosing and treating preeclampsia early is very important. If not treated early, it can cause serious problems for you and your baby. One problem it can lead to is eclampsia, which is a condition that causes muscle jerking or shaking (convulsions or seizures) in the mother. Delivering your baby is the best treatment for preeclampsia or eclampsia. Preeclampsia and eclampsia symptoms usually go away after your baby is born. °What are the causes? °The cause of preeclampsia is not known. °What increases the risk? °The following risk factors make you more likely to develop preeclampsia: °· Being pregnant for the first time. °· Having had preeclampsia during a past pregnancy. °· Having a family history of preeclampsia. °· Having high blood pressure. °· Being pregnant with twins or triplets. °· Being 35 or older. °· Being African-American. °· Having kidney disease or diabetes. °· Having medical conditions such as lupus or blood diseases. °· Being very overweight (obese). ° °What are the signs or symptoms? °The earliest signs of preeclampsia are: °· High blood pressure. °· Increased protein in your urine. Your health care provider will check for this at every visit before you give birth (prenatal visit). ° °Other symptoms that may develop as the condition gets worse include: °· Severe headaches. °· Sudden weight gain. °· Swelling of the hands, face, legs, and feet. °· Nausea and vomiting. °· Vision problems, such as blurred or double vision. °· Numbness in the face, arms, legs, and feet. °· Urinating less than usual. °· Dizziness. °· Slurred speech. °· Abdominal pain,  especially upper abdominal pain. °· Convulsions or seizures. ° °Symptoms generally go away after giving birth. °How is this diagnosed? °There are no screening tests for preeclampsia. Your health care provider will ask you about symptoms and check for signs of preeclampsia during your prenatal visits. You may also have tests that include: °· Urine tests. °· Blood tests. °· Checking your blood pressure. °· Monitoring your baby’s heart rate. °· Ultrasound. ° °How is this treated? °You and your health care provider will determine the treatment approach that is best for you. Treatment may include: °· Having more frequent prenatal exams to check for signs of preeclampsia, if you have an increased risk for preeclampsia. °· Bed rest. °· Reducing how much salt (sodium) you eat. °· Medicine to lower your blood pressure. °· Staying in the hospital, if your condition is severe. There, treatment will focus on controlling your blood pressure and the amount of fluids in your body (fluid retention). °· You may need to take medicine (magnesium sulfate) to prevent seizures. This medicine may be given as an injection or through an IV tube. °· Delivering your baby early, if your condition gets worse. You may have your labor started with medicine (induced), or you may have a cesarean delivery. ° °Follow these instructions at home: °Eating and drinking ° °· Drink enough fluid to keep your urine clear or pale yellow. °· Eat a healthy diet that is low in sodium. Do not add salt to your food. Check nutrition labels to see how much sodium a food or beverage contains. °· Avoid caffeine. °Lifestyle °· Do not use any products that contain nicotine or tobacco, such as cigarettes   and e-cigarettes. If you need help quitting, ask your health care provider. °· Do not use alcohol or drugs. °· Avoid stress as much as possible. Rest and get plenty of sleep. °General instructions °· Take over-the-counter and prescription medicines only as told by your  health care provider. °· When lying down, lie on your side. This keeps pressure off of your baby. °· When sitting or lying down, raise (elevate) your feet. Try putting some pillows underneath your lower legs. °· Exercise regularly. Ask your health care provider what kinds of exercise are best for you. °· Keep all follow-up and prenatal visits as told by your health care provider. This is important. °How is this prevented? °To prevent preeclampsia or eclampsia from developing during another pregnancy: °· Get proper medical care during pregnancy. Your health care provider may be able to prevent preeclampsia or diagnose and treat it early. °· Your health care provider may have you take a low-dose aspirin or a calcium supplement during your next pregnancy. °· You may have tests of your blood pressure and kidney function after giving birth. °· Maintain a healthy weight. Ask your health care provider for help managing weight gain during pregnancy. °· Work with your health care provider to manage any long-term (chronic) health conditions you have, such as diabetes or kidney problems. ° °Contact a health care provider if: °· You gain more weight than expected. °· You have headaches. °· You have nausea or vomiting. °· You have abdominal pain. °· You feel dizzy or light-headed. °Get help right away if: °· You develop sudden or severe swelling anywhere in your body. This usually happens in the legs. °· You gain 5 lbs (2.3 kg) or more during one week. °· You have severe: °? Abdominal pain. °? Headaches. °? Dizziness. °? Vision problems. °? Confusion. °? Nausea or vomiting. °· You have a seizure. °· You have trouble moving any part of your body. °· You develop numbness in any part of your body. °· You have trouble speaking. °· You have any abnormal bleeding. °· You pass out. °This information is not intended to replace advice given to you by your health care provider. Make sure you discuss any questions you have with your health  care provider. °Document Released: 11/03/2000 Document Revised: 07/04/2016 Document Reviewed: 06/12/2016 °Elsevier Interactive Patient Education © 2018 [employer] ° °

## 2017-12-10 ENCOUNTER — Ambulatory Visit: Payer: Medicaid Other | Admitting: *Deleted

## 2017-12-10 ENCOUNTER — Encounter: Payer: Self-pay | Admitting: Women's Health

## 2017-12-10 ENCOUNTER — Ambulatory Visit (INDEPENDENT_AMBULATORY_CARE_PROVIDER_SITE_OTHER): Payer: Medicaid Other | Admitting: Women's Health

## 2017-12-10 ENCOUNTER — Encounter: Payer: Self-pay | Admitting: *Deleted

## 2017-12-10 VITALS — BP 138/98 | Wt 152.0 lb

## 2017-12-10 VITALS — BP 138/98 | HR 62 | Wt 152.0 lb

## 2017-12-10 DIAGNOSIS — O165 Unspecified maternal hypertension, complicating the puerperium: Secondary | ICD-10-CM

## 2017-12-10 DIAGNOSIS — Z8759 Personal history of other complications of pregnancy, childbirth and the puerperium: Secondary | ICD-10-CM

## 2017-12-10 DIAGNOSIS — Z8679 Personal history of other diseases of the circulatory system: Secondary | ICD-10-CM | POA: Insufficient documentation

## 2017-12-10 DIAGNOSIS — Z0130 Encounter for examination of blood pressure without abnormal findings: Secondary | ICD-10-CM

## 2017-12-10 MED ORDER — AMLODIPINE BESYLATE 5 MG PO TABS: 5.0000 mg | ORAL_TABLET | Freq: Every day | ORAL | 0 refills | Status: DC

## 2017-12-10 NOTE — Progress Notes (Signed)
   GYN VISIT Patient name: Debra [name] MRN [id number]  Date of birth: 1991/09/18 Chief Complaint:   Blood Pressure Check  History of Present Illness:   Debra LevanCheyenne Theiler is a 27 y.o. 332P1011 Caucasian female being seen today as work-in. Was here for bp check, and is elevated so was worked-in with me. She is 4 weeks s/p uncomplicated SVB, bp's were normal during pregnancy as well as intrapartum/pp in hospital. She went to [facility] 5d after delivery for abd pain/leg swelling- bp was normal then.  Went to [facility] 3d ago w/ report of foot swelling and chest pain, bp's were elevated then, EKG showed sinus brady w/ Lt axis deviation and T wave abnormality, pre-e labs normal. She was advised to come today for bp check. Denies ha, visual changes, ruq/epigastric pain, n/v.  Denies current chest pain, any sob, etc.   No LMP recorded. The current method of family planning is abstinence. Review of Systems:   Pertinent items are noted in HPI Denies fever/chills, dizziness, headaches, visual disturbances, fatigue, shortness of breath, chest pain, abdominal pain, vomiting, abnormal vaginal discharge/itching/odor/irritation, problems with periods, bowel movements, urination, or intercourse unless otherwise stated above.  Pertinent History Reviewed:  Reviewed past medical,surgical, social, obstetrical and family history.  Reviewed problem list, medications and allergies. Physical Assessment:   Vitals:   12/10/17 1149  BP: (!) 138/98  Pulse: 62  Weight: 152 lb (68.9 kg)  Body mass index is 23.11 kg/m.       Physical Examination:   General appearance: alert, well appearing, and in no distress  Mental status: alert, oriented to person, place, and time  Skin: warm & dry   Cardiovascular: normal heart rate noted  Respiratory: normal respiratory effort, no distress  Abdomen: soft, non-tender   Pelvic: examination not indicated  Extremities: trace edema  No results found for this or any previous visit (from the  past 24 hour(s)).  Assessment & Plan:  1) 4wks s/p SVB  2) PP HTN> rx norvasc 5mg  daily, discussed pre-e s/s, reasons to seek care. Stop norvasc 2d prior to pp visit. Will cancel 1/25 pp visit and push out til 2/5  Meds:  Meds ordered this encounter  Medications  . amLODipine (NORVASC) 5 MG tablet    Sig: Take 1 tablet (5 mg total) by mouth daily.    Dispense:  30 tablet    Refill:  0    Order Specific Question:   Supervising Provider    Answer:   Despina HiddenEURE, LUTHER H [[id number]]    No orders of the defined types were placed in this encounter.   Return for cancel 1/25 appt, reschedule for 2/5.  Marge DuncansBooker, Kimberly Randall CNM, [facility] 12/10/2017 11:56 AM

## 2017-12-10 NOTE — Patient Instructions (Signed)
Call the office ([phone]) or [facility] for these signs of pre-eclampsia:  Severe headache that does not go away with Tylenol  Visual changes- seeing spots, double, blurred vision  Pain under your right breast or upper abdomen that does not go away with Tums or heartburn medicine  Nausea and/or vomiting  Severe swelling in your hands, feet, and face       

## 2017-12-14 ENCOUNTER — Ambulatory Visit: Payer: Medicaid Other | Admitting: Women's Health

## 2017-12-18 ENCOUNTER — Telehealth: Payer: Self-pay | Admitting: *Deleted

## 2017-12-18 NOTE — Telephone Encounter (Signed)
Informed patient that Lysol if fine to use in house for cleaning. Advised she could try wipes and not spray.  Encouraged good hand hygiene as well.

## 2017-12-25 ENCOUNTER — Ambulatory Visit: Payer: Medicaid Other | Admitting: Women's Health

## 2017-12-26 ENCOUNTER — Ambulatory Visit (INDEPENDENT_AMBULATORY_CARE_PROVIDER_SITE_OTHER): Payer: Medicaid Other | Admitting: Women's Health

## 2017-12-26 ENCOUNTER — Encounter: Payer: Self-pay | Admitting: Women's Health

## 2017-12-26 DIAGNOSIS — N898 Other specified noninflammatory disorders of vagina: Secondary | ICD-10-CM

## 2017-12-26 DIAGNOSIS — Z8759 Personal history of other complications of pregnancy, childbirth and the puerperium: Secondary | ICD-10-CM

## 2017-12-26 DIAGNOSIS — Z3202 Encounter for pregnancy test, result negative: Secondary | ICD-10-CM

## 2017-12-26 DIAGNOSIS — Z8679 Personal history of other diseases of the circulatory system: Secondary | ICD-10-CM

## 2017-12-26 LAB — POCT URINE PREGNANCY: Preg Test, Ur: NEGATIVE

## 2017-12-26 MED ORDER — NORETHINDRONE 0.35 MG PO TABS
1.0000 | ORAL_TABLET | Freq: Every day | ORAL | 11 refills | Status: DC
Start: 2017-12-26 — End: 2020-08-17

## 2017-12-26 NOTE — Progress Notes (Signed)
POSTPARTUM VISIT Patient name: Debra [name] Batie MRN [id number]  Date of birth: 16-Jan-1991 Chief Complaint:   postpartum visit (wants to start birth control pills)  History of Present Illness:   Debra [name] is a 27 y.o. 272P1011 Caucasian female being seen today for a postpartum visit. She is 5 weeks postpartum following a spontaneous vaginal delivery at 39.6 gestational weeks. Anesthesia: none. I have fully reviewed the prenatal and intrapartum course. Pregnancy complicated by IUGR. Postpartum course has been complicated by PP HTN dx 4wks s/p SVB, rx'd norvasc 5mg  daily. Stopped taking not long after starting b/c felt bp was low.  Bleeding no bleeding. Bowel function is occ feels she doesn't have control of bowels. Gets urge, then if not close to br may have an accident. Diarrhea only 1 time. Bladder function is normal.  Patient is not sexually active. Last sexual activity: prior to birth of baby.  Contraception method is wants POPs.  [place] Postpartum Depression Screening: negative. Score 0.   Last pap 12/07/14.  Results were normal .  No LMP recorded.  Baby's course has been uncomplicated. Baby is feeding by breast & supplementing.  Review of Systems:   Pertinent items are noted in HPI Denies Abnormal vaginal discharge w/ itching/odor/irritation, headaches, visual changes, shortness of breath, chest pain, abdominal pain, severe nausea/vomiting, or problems with urination or bowel movements. Pertinent History Reviewed:  Reviewed past medical,surgical, obstetrical and family history.  Reviewed problem list, medications and allergies. OB History  Gravida Para Term Preterm AB Living  2 1 1   1 1   SAB TAB Ectopic Multiple Live Births    1   0 1    # Outcome Date GA Lbr Len/2nd Weight Sex Delivery Anes PTL Lv  2 Term 11/13/17 [id number] 00:53 / 00:42 5 lb 14.7 oz (2.685 kg) F Vag-Spont Local  LIV  1 TAB 2009             Physical Assessment:   Vitals:   12/26/17 1557  BP: 106/78    Pulse: 86  Weight: 144 lb (65.3 kg)  Height: 5\' 9"  (1.753 m)  Body mass index is 21.27 kg/m.       Physical Examination:   General appearance: alert, well appearing, and in no distress  Mental status: alert, oriented to person, place, and time  Skin: warm & dry   Cardiovascular: normal heart rate noted   Respiratory: normal respiratory effort, no distress   Breasts: deferred, no complaints   Abdomen: soft, non-tender   Pelvic: VULVA: normal appearing vulva with no masses, tenderness or lesions On bimanual exam large ~4cm bulge felt in Lt lateral vaginal wall- not tender.  Spec exam: VAGINA: normal appearing vagina w/ normal color and discharge, unable to see any difference in Lt vaginal wall, CERVIX: normal appearing cervix without discharge or lesions, UTERUS: uterus is normal size, shape, consistency and nontender  Rectal: No hemorrhoids  Extremities: no edema       Results for orders placed or performed in visit on 12/26/17 (from the past 24 hour(s))  POCT urine pregnancy   Collection Time: 12/26/17  4:07 PM  Result Value Ref Range   Preg Test, Ur Negative Negative    Assessment & Plan:  1) Postpartum exam 2) 5 wks s/p SVB 3) Breast & bottlefeeding> tips to increase supply given 4) Depression screening 5) Contraception counseling, pt prefers POPs. Rx micronor w/ 11RF, understands has to take at exact same time daily to be effective, if late taking use condom  as back-up  6) Resolved PP HTN 7) Large Lt lateral vaginal wall bulge> discussed further w/ pt, states she did have a lot of rectal pain after delivery, had to sit on donut pillow, had large bruise on Lt buttocks near rectum. Sounds like was probably hematoma. No MD here for co-exam, will schedule w/ MD tomorrow for exam   Meds:  Meds ordered this encounter  Medications  . norethindrone (MICRONOR,CAMILA,ERRIN) 0.35 MG tablet    Sig: Take 1 tablet (0.35 mg total) by mouth daily.    Dispense:  1 Package    Refill:  11     Order Specific Question:   Supervising Provider    Answer:   Duane Lope H [[id number]]    Follow-up: Return for tomorrow @ 1100 w/ LHE. Also needs pap  Orders Placed This Encounter  Procedures  . POCT urine pregnancy    Marge Duncans CNM, [facility] 12/26/2017 4:56 PM

## 2017-12-26 NOTE — Patient Instructions (Signed)
Tips To Increase Milk Supply  Lots of water! Enough so that your urine is clear  Plenty of calories, if you're not getting enough calories, your milk supply can decrease  Breastfeed/pump often, every 2-3 hours x 20-30mins  Fenugreek 3 pills 3 times a day, this may make your urine smell like maple syrup  Mother's Milk Tea  Lactation cookies, google for the recipe  Real oatmeal   

## 2017-12-27 ENCOUNTER — Encounter: Payer: Self-pay | Admitting: Obstetrics & Gynecology

## 2017-12-27 ENCOUNTER — Ambulatory Visit (INDEPENDENT_AMBULATORY_CARE_PROVIDER_SITE_OTHER): Payer: Medicaid Other | Admitting: Obstetrics & Gynecology

## 2017-12-27 ENCOUNTER — Other Ambulatory Visit (HOSPITAL_COMMUNITY)
Admission: RE | Admit: 2017-12-27 | Discharge: 2017-12-27 | Disposition: A | Discharge status: Home / Self Care | Payer: Medicaid Other | Source: Ambulatory Visit | Attending: Obstetrics & Gynecology | Admitting: Obstetrics & Gynecology

## 2017-12-27 VITALS — BP 132/64 | HR 84 | Ht 69.0 in | Wt 144.0 lb

## 2017-12-27 DIAGNOSIS — Z01419 Encounter for gynecological examination (general) (routine) without abnormal findings: Secondary | ICD-10-CM | POA: Insufficient documentation

## 2017-12-27 DIAGNOSIS — R1907 Generalized intra-abdominal and pelvic swelling, mass and lump: Secondary | ICD-10-CM

## 2017-12-27 NOTE — Progress Notes (Signed)
Subjective:            Chief Complaint  Patient presents with  . Gynecologic Exam      27 y.o. G2P1011 No LMP recorded. The current method of family planning is oral progesterone-only contraceptive.  Outpatient Encounter Medications as of 12/27/2017  Medication Sig  . norethindrone (MICRONOR,CAMILA,ERRIN) 0.35 MG tablet Take 1 tablet (0.35 mg total) by mouth daily.  . [DISCONTINUED] FENUGREEK PO Take by mouth.  . [DISCONTINUED] omeprazole (PRILOSEC) 20 MG capsule Take 20 mg by mouth daily.  . [DISCONTINUED] Prenatal Vit-Fe Fumarate-FA (PNV PRENATAL PLUS MULTIVITAMIN) 27-1 MG TABS Take 1 tablet by mouth daily.   No facility-administered encounter medications on file as of 12/27/2017.     Subjective 5 weeks postpartum without complaints Left paravaginal mass discovered at pp visit Pt is asymptomatic No discharge fevers or pain Uncomplicated delivery Past Medical History:  Diagnosis Date  . Contraceptive management 11/27/2014  . Encounter for IUD removal 12/04/2014  . IUD (intrauterine device) in place 11/27/2014    Past Surgical History:  Procedure Laterality Date  . HERNIA REPAIR Bilateral 1998  . WISDOM TOOTH EXTRACTION      OB History    Gravida  2   Para  1   Term  1   Preterm      AB  1   Living  1     SAB      TAB  1   Ectopic      Multiple  0   Live Births  1           Allergies  Allergen Reactions  . Bee Venom Anaphylaxis and Hives    Social History   Socioeconomic History  . Marital status: Single    Spouse name: Not on file  . Number of children: Not on file  . Years of education: Not on file  . Highest education level: Not on file  Occupational History  . Not on file  Social Needs  . Financial resource strain: Not on file  . Food insecurity:    Worry: Not on file    Inability: Not on file  . Transportation needs:    Medical: Not on file    Non-medical: Not on file  Tobacco Use  . Smoking status: Current Every Day  Smoker    Years: 8.00    Types: Cigarettes  . Smokeless tobacco: Never Used  . Tobacco comment: smokes 5 cig daily  Substance and Sexual Activity  . Alcohol use: No    Comment: occ  . Drug use: No  . Sexual activity: Not Currently    Birth control/protection: Pill  Lifestyle  . Physical activity:    Days per week: Not on file    Minutes per session: Not on file  . Stress: Not on file  Relationships  . Social connections:    Talks on phone: Not on file    Gets together: Not on file    Attends religious service: Not on file    Active member of club or organization: Not on file    Attends meetings of clubs or organizations: Not on file    Relationship status: Not on file  Other Topics Concern  . Not on file  Social History Narrative  . Not on file    Family History  Problem Relation Age of Onset  . Healthy Mother   . Healthy Father   . Asthma Brother   . Other Maternal Grandmother  MVA  . Other Maternal Grandfather        fibrosis of lungs  . Cancer Paternal Grandmother   . COPD Paternal Grandmother   . Cancer Paternal Grandfather        lung   . Heart attack Maternal Uncle     Medications:       Current Outpatient Medications:  .  norethindrone (MICRONOR,CAMILA,ERRIN) 0.35 MG tablet, Take 1 tablet (0.35 mg total) by mouth daily., Disp: 1 Package, Rfl: 11 .  omeprazole (PRILOSEC) 20 MG capsule, Take 1 capsule (20 mg total) by mouth daily. (Patient taking differently: Take 20 mg by mouth as needed. ), Disp: 30 capsule, Rfl: 3  Objective Blood pressure 132/64, pulse 84, height 5\' 9"  (1.753 m), weight 144 lb (65.3 kg), not currently breastfeeding.  General WDWN female NAD Vulva:  normal appearing vulva with no masses, tenderness or lesions Vagina:  Firm large mass protruding into vagina from the left paravaginal space, rectal exam is negative, firm Cervix:  no cervical motion tenderness and no lesions Uterus:  normal size, contour, position, consistency,  mobility, non-tender Adnexa: ovaries:present,  normal adnexa in size, nontender and no masses   Pertinent ROS No burning with urination, frequency or urgency No nausea, vomiting or diarrhea Nor fever chills or other constitutional symptoms   Labs or studies pending    Impression Diagnoses this Encounter::   ICD-10-CM   1. left paravaginal hematoma R19.07 CT ABDOMEN PELVIS W CONTRAST    CT ABDOMEN PELVIS W CONTRAST  2. Encounter for gynecological examination with Papanicolaou smear of cervix Z01.419 Cytology - PAP    Established relevant diagnosis(es):   Plan/Recommendations: No orders of the defined types were placed in this encounter.   Labs or Scans Ordered: Orders Placed This Encounter  Procedures  . CT ABDOMEN PELVIS W CONTRAST    Management:: I am assuming this represents a left vaginal sidewall heamatoma that somehow miraculously the patient is asymptomatic from CT eval of left paravaginal mass  Follow up Return for will call pt with results.   .   All questions were answered.

## 2017-12-31 LAB — CYTOLOGY - PAP
CHLAMYDIA, DNA PROBE: NEGATIVE
DIAGNOSIS: NEGATIVE
Neisseria Gonorrhea: NEGATIVE

## 2018-01-04 ENCOUNTER — Encounter (HOSPITAL_COMMUNITY): Payer: Self-pay

## 2018-01-04 ENCOUNTER — Ambulatory Visit (HOSPITAL_COMMUNITY)
Admission: RE | Admit: 2018-01-04 | Discharge: 2018-01-04 | Disposition: A | Discharge status: Home / Self Care | Payer: Medicaid Other | Source: Ambulatory Visit | Attending: Obstetrics & Gynecology | Admitting: Obstetrics & Gynecology

## 2018-01-04 DIAGNOSIS — R935 Abnormal findings on diagnostic imaging of other abdominal regions, including retroperitoneum: Secondary | ICD-10-CM | POA: Insufficient documentation

## 2018-01-04 DIAGNOSIS — R1907 Generalized intra-abdominal and pelvic swelling, mass and lump: Secondary | ICD-10-CM | POA: Insufficient documentation

## 2018-01-04 MED ORDER — IOPAMIDOL (ISOVUE-300) INJECTION 61%
100.0000 mL | Freq: Once | INTRAVENOUS | Status: AC | PRN
  Administered 2018-01-04: 100 mL via INTRAVENOUS

## 2018-01-07 ENCOUNTER — Encounter: Payer: Self-pay | Admitting: Obstetrics & Gynecology

## 2018-01-09 ENCOUNTER — Encounter: Payer: Self-pay | Admitting: Obstetrics & Gynecology

## 2018-01-10 ENCOUNTER — Encounter: Payer: Self-pay | Admitting: Obstetrics & Gynecology

## 2018-01-11 ENCOUNTER — Other Ambulatory Visit: Payer: Self-pay | Admitting: *Deleted

## 2018-01-15 MED ORDER — OMEPRAZOLE 20 MG PO CPDR: 20.0000 mg | DELAYED_RELEASE_CAPSULE | Freq: Every day | ORAL | 3 refills | Status: DC

## 2018-02-15 ENCOUNTER — Encounter: Payer: Self-pay | Admitting: Obstetrics & Gynecology

## 2018-02-15 ENCOUNTER — Ambulatory Visit: Payer: Medicaid Other | Admitting: Obstetrics & Gynecology

## 2018-02-15 VITALS — BP 110/80 | HR 89 | Ht 69.0 in | Wt 146.5 lb

## 2018-02-15 DIAGNOSIS — N898 Other specified noninflammatory disorders of vagina: Secondary | ICD-10-CM

## 2018-02-15 NOTE — Progress Notes (Signed)
Chief Complaint  Patient presents with  . Follow-up    on mass      27 y.o. G2P1011 Patient's last menstrual period was 02/06/2018. The current method of family planning is oral progesterone-only contraceptive.  Outpatient Encounter Medications as of 02/15/2018  Medication Sig  . norethindrone (MICRONOR,CAMILA,ERRIN) 0.35 MG tablet Take 1 tablet (0.35 mg total) by mouth daily.  Marland Kitchen. omeprazole (PRILOSEC) 20 MG capsule Take 1 capsule (20 mg total) by mouth daily. (Patient taking differently: Take 20 mg by mouth as needed. )  . [DISCONTINUED] FENUGREEK PO Take by mouth.  . [DISCONTINUED] Prenatal Vit-Fe Fumarate-FA (PNV PRENATAL PLUS MULTIVITAMIN) 27-1 MG TABS Take 1 tablet by mouth daily.   No facility-administered encounter medications on file as of 02/15/2018.     Subjective Follow up visit:  Re eval for left paravaginal mass thought to be a left pelvic sidewall hematoma, traumtaic from delivery Pt remains asymptomatic Nothing new to report Past Medical History:  Diagnosis Date  . Contraceptive management 11/27/2014  . Encounter for IUD removal 12/04/2014  . IUD (intrauterine device) in place 11/27/2014    Past Surgical History:  Procedure Laterality Date  . HERNIA REPAIR Bilateral 1998  . WISDOM TOOTH EXTRACTION      OB History    Gravida  2   Para  1   Term  1   Preterm      AB  1   Living  1     SAB      TAB  1   Ectopic      Multiple  0   Live Births  1           Allergies  Allergen Reactions  . Bee Venom Anaphylaxis and Hives    Social History   Socioeconomic History  . Marital status: Single    Spouse name: Not on file  . Number of children: Not on file  . Years of education: Not on file  . Highest education level: Not on file  Occupational History  . Not on file  Social Needs  . Financial resource strain: Not on file  . Food insecurity:    Worry: Not on file    Inability: Not on file  . Transportation needs:    Medical:  Not on file    Non-medical: Not on file  Tobacco Use  . Smoking status: Current Every Day Smoker    Years: 8.00    Types: Cigarettes  . Smokeless tobacco: Never Used  . Tobacco comment: smokes 5 cig daily  Substance and Sexual Activity  . Alcohol use: No    Comment: occ  . Drug use: No  . Sexual activity: Not Currently    Birth control/protection: Pill  Lifestyle  . Physical activity:    Days per week: Not on file    Minutes per session: Not on file  . Stress: Not on file  Relationships  . Social connections:    Talks on phone: Not on file    Gets together: Not on file    Attends religious service: Not on file    Active member of club or organization: Not on file    Attends meetings of clubs or organizations: Not on file    Relationship status: Not on file  Other Topics Concern  . Not on file  Social History Narrative  . Not on file    Family History  Problem Relation Age of Onset  . Healthy Mother   .  Healthy Father   . Asthma Brother   . Other Maternal Grandmother        MVA  . Other Maternal Grandfather        fibrosis of lungs  . Cancer Paternal Grandmother   . COPD Paternal Grandmother   . Cancer Paternal Grandfather        lung   . Heart attack Maternal Uncle     Medications:       Current Outpatient Medications:  .  norethindrone (MICRONOR,CAMILA,ERRIN) 0.35 MG tablet, Take 1 tablet (0.35 mg total) by mouth daily., Disp: 1 Package, Rfl: 11 .  omeprazole (PRILOSEC) 20 MG capsule, Take 1 capsule (20 mg total) by mouth daily. (Patient taking differently: Take 20 mg by mouth as needed. ), Disp: 30 capsule, Rfl: 3  Objective Blood pressure 110/80, pulse 89, height 5\' 9"  (1.753 m), weight 146 lb 8 oz (66.5 kg), last menstrual period 02/06/2018, not currently breastfeeding.  Complete resolution of left pelvic sidewall paravainal mass  Pertinent ROS   Labs or studies     Impression Diagnoses this Encounter::   ICD-10-CM   1. Vaginal hematoma N89.8      Established relevant diagnosis(es):   Plan/Recommendations: No orders of the defined types were placed in this encounter.   Labs or Scans Ordered: No orders of the defined types were placed in this encounter.   Management:: No FU needed  Follow up Return in about 1 year (around 02/16/2019) for yearly.       All questions were answered.

## 2018-07-16 ENCOUNTER — Encounter: Payer: Self-pay | Admitting: Obstetrics & Gynecology

## 2019-01-22 NOTE — Telephone Encounter (Signed)
Note sent to nurse. 

## 2019-06-19 IMAGING — US US MFM FETAL BPP W/O NON-STRESS
1 series · 12 of 28 positions shown · non-contrast
Comparison: none

[Series 1: us mfm fetal bpp w/o non-stress · 34 acquisitions, 12 frames shown]
[im 2/34]
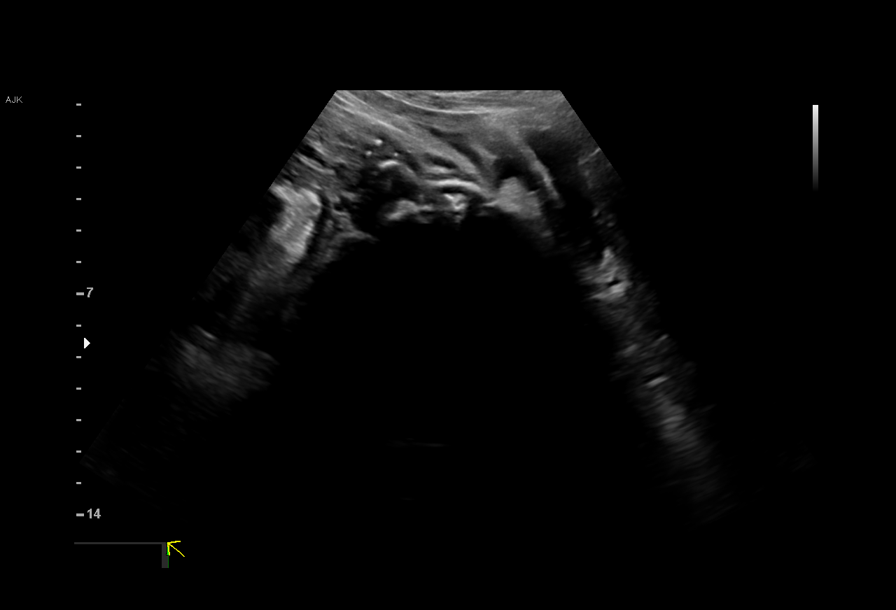
[im 4/34]
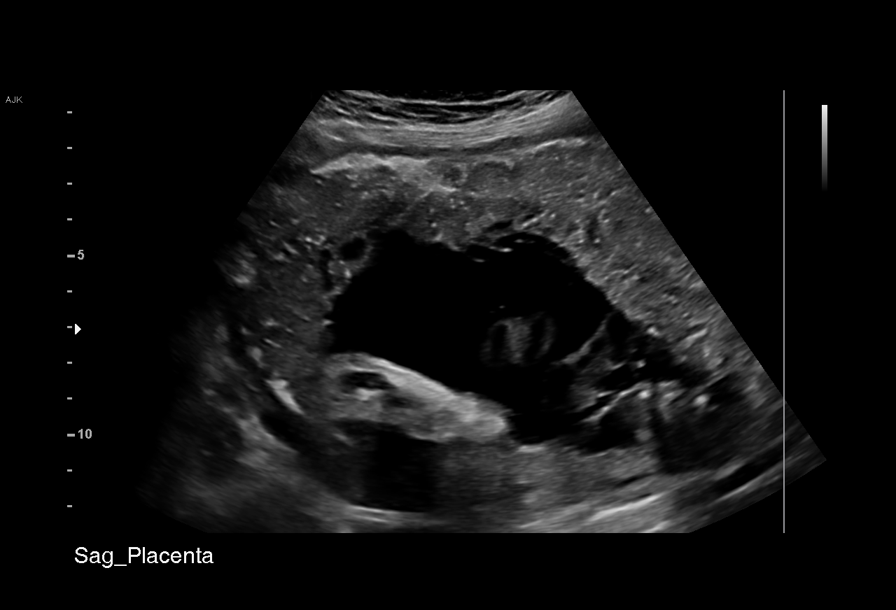
[im 7/34]
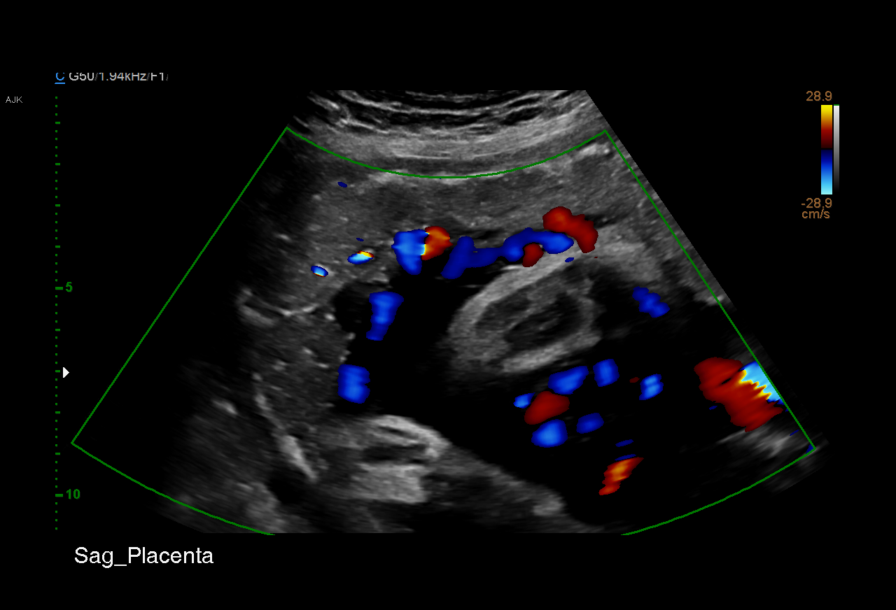
[im 10/34]
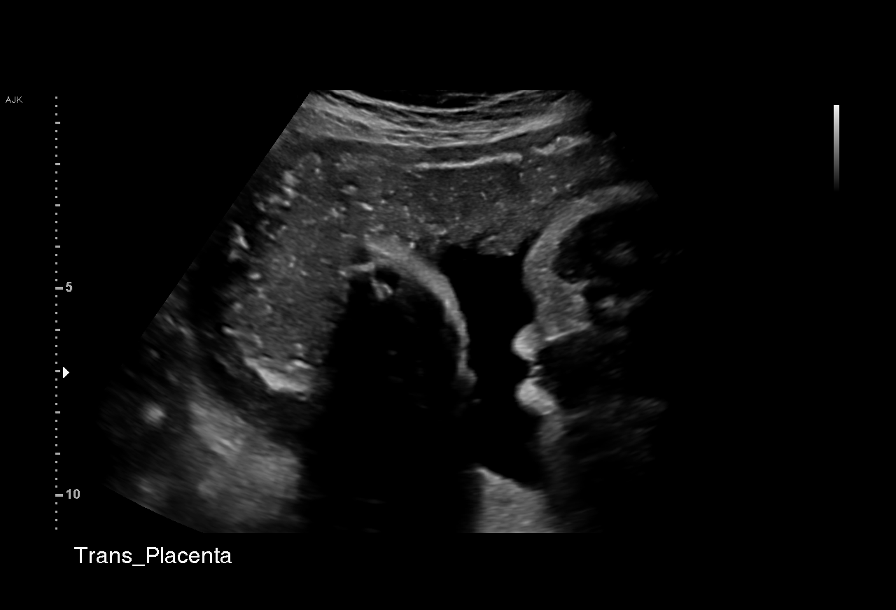
[im 13/34]
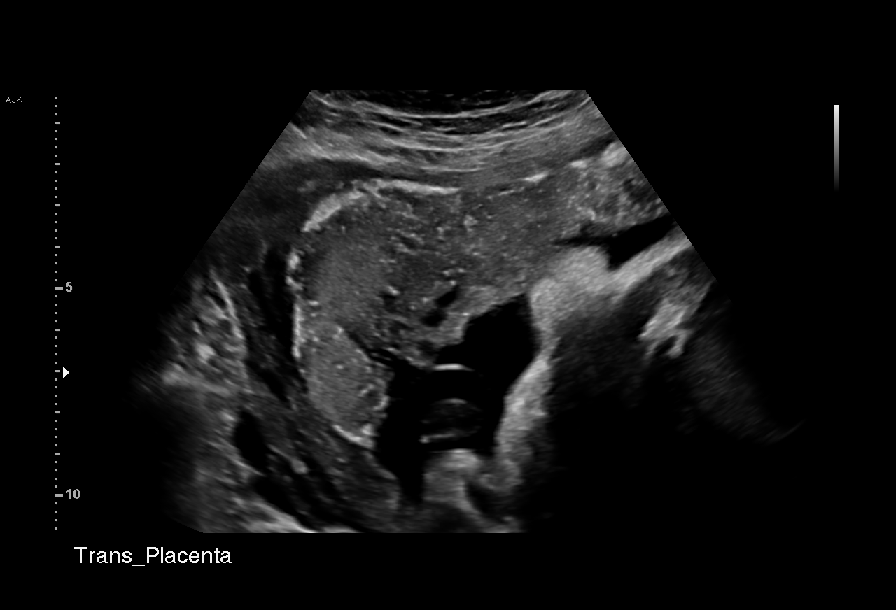
[im 15/34]
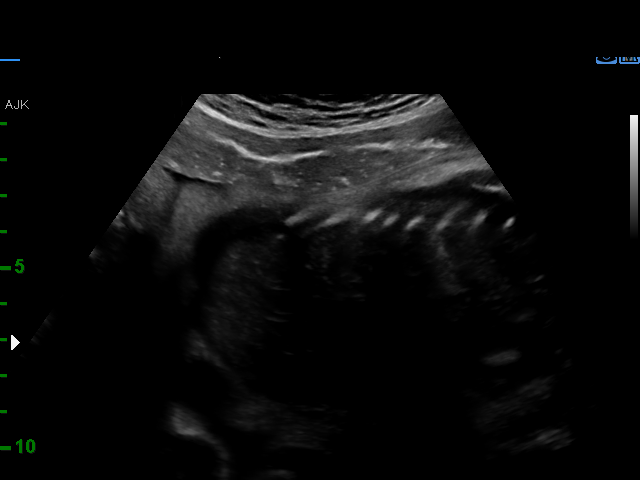
[im 19/34]
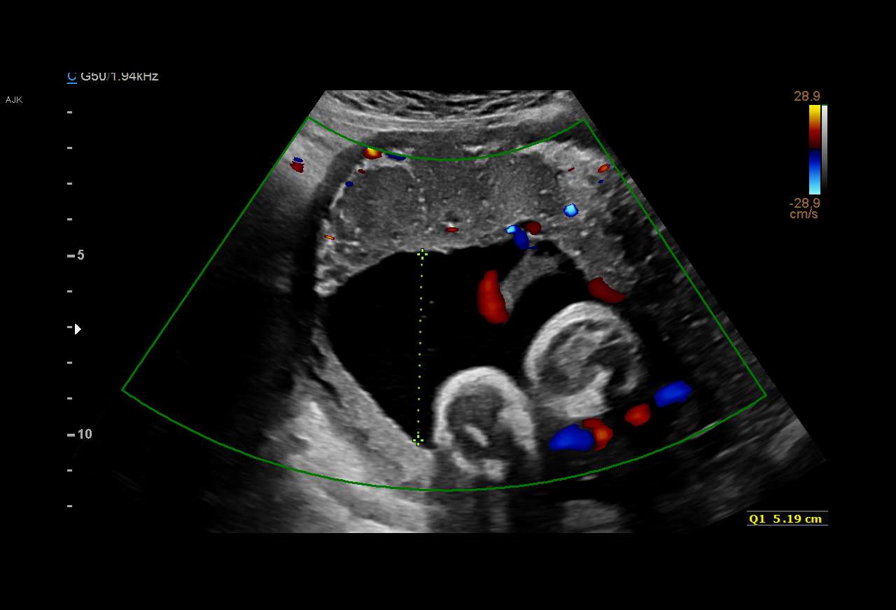
[im 21/34]
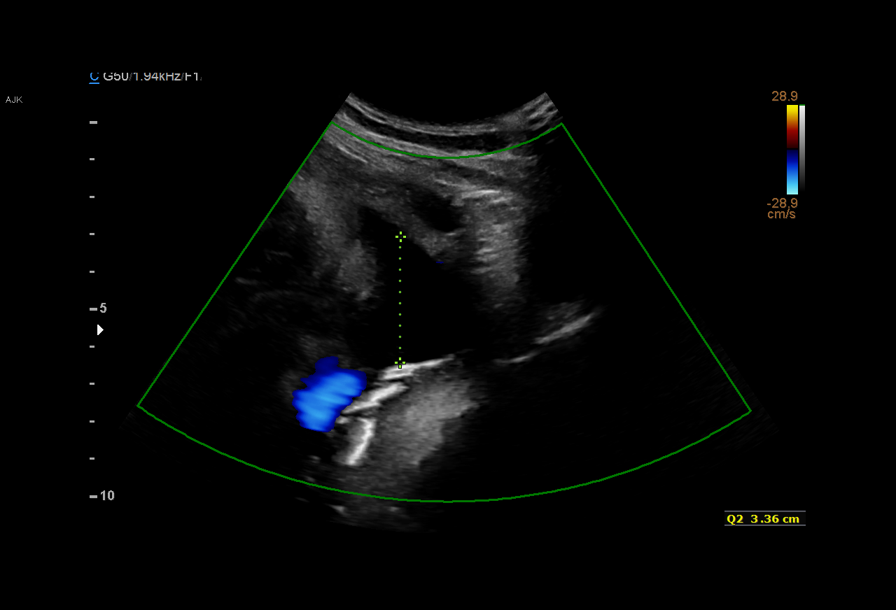
[im 24/34]
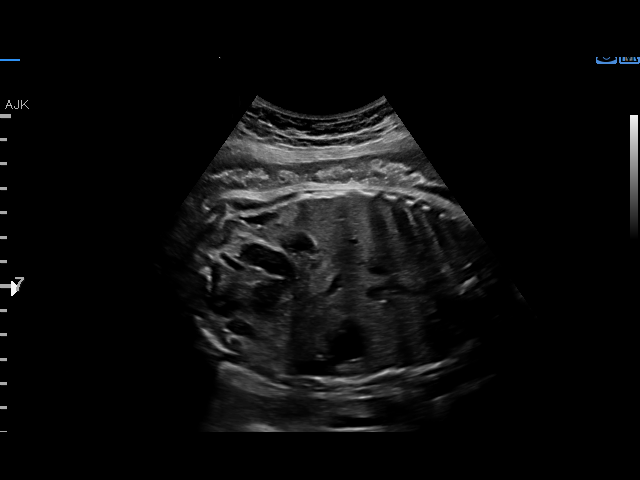
[im 27/34]
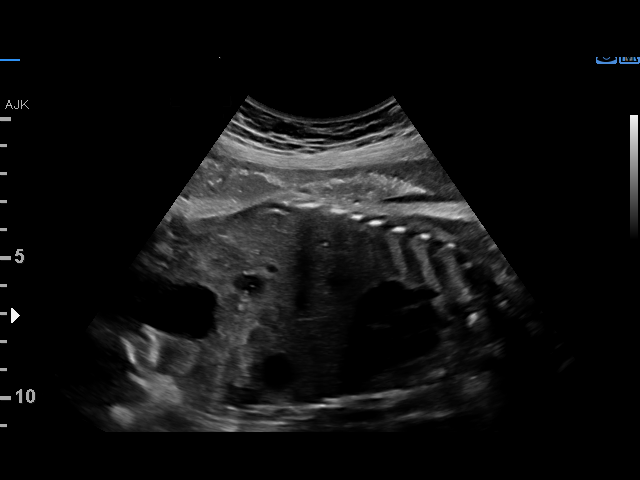
[im 30/34]
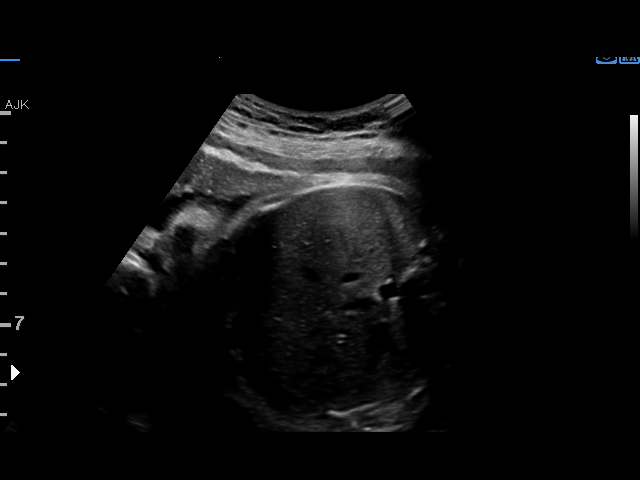
[im 32/34]
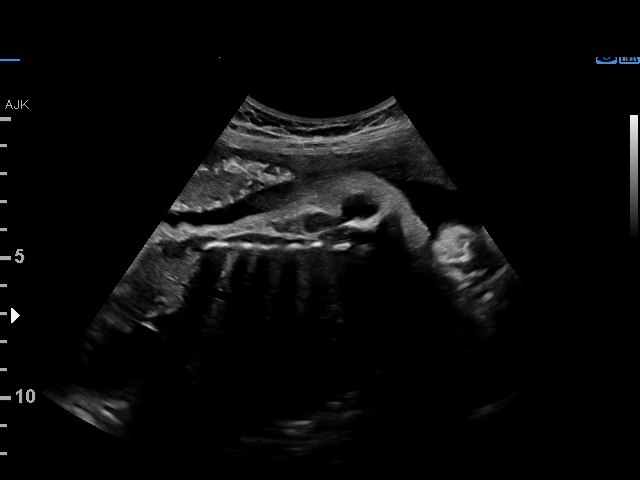

[12 of 28 positions shown; findings below may reference images not displayed]

1  [place] BRASS           [id number]      [id number]     [id number]
Indications

35 weeks gestation of pregnancy
Non-reactive NST
Fetal Evaluation

Num Of Fetuses:     1
Fetal Heart         136
Rate(bpm):
Cardiac Activity:   Observed
Presentation:       Cephalic
Placenta:           Anterior, above cervical os
P. Cord Insertion:  Visualized, central

Amniotic Fluid
AFI FV:      Subjectively within normal limits

AFI Sum(cm)     %Tile       Largest Pocket(cm)
14.71           53          7

RUQ(cm)       RLQ(cm)       LUQ(cm)        LLQ(cm)
7
Biophysical Evaluation

Amniotic F.V:   Within normal limits       F. Tone:        Observed
F. Movement:    Observed                   Score:          [DATE]
F. Breathing:   Observed
Gestational Age

LMP:           33w 2d        Date:  02/20/17                 EDD:   11/27/17
Best:          35w 1d     Det. By:  Early Exam  (04/17/17)   EDD:   11/14/17
Impression

IUP at 35+1 weeks with nonreactive NST
Normal amniotic fluid
BPP [DATE]
Recommendations

Continue clinical evaluation and management

## 2020-06-04 ENCOUNTER — Other Ambulatory Visit: Payer: Medicaid Other | Admitting: Obstetrics & Gynecology

## 2020-06-17 ENCOUNTER — Other Ambulatory Visit: Payer: Medicaid Other | Admitting: Obstetrics & Gynecology

## 2020-07-23 ENCOUNTER — Other Ambulatory Visit: Payer: Medicaid Other | Admitting: Obstetrics & Gynecology

## 2020-08-02 ENCOUNTER — Other Ambulatory Visit: Payer: Medicaid Other | Admitting: Obstetrics & Gynecology

## 2020-08-17 ENCOUNTER — Encounter: Payer: Self-pay | Admitting: Obstetrics & Gynecology

## 2020-08-17 ENCOUNTER — Ambulatory Visit (INDEPENDENT_AMBULATORY_CARE_PROVIDER_SITE_OTHER): Payer: Medicaid Other | Admitting: Obstetrics & Gynecology

## 2020-08-17 ENCOUNTER — Other Ambulatory Visit (HOSPITAL_COMMUNITY)
Admission: RE | Admit: 2020-08-17 | Discharge: 2020-08-17 | Disposition: A | Discharge status: Home / Self Care | Payer: Medicaid Other | Source: Ambulatory Visit | Attending: Obstetrics & Gynecology | Admitting: Obstetrics & Gynecology

## 2020-08-17 VITALS — Ht 69.0 in

## 2020-08-17 DIAGNOSIS — Z01419 Encounter for gynecological examination (general) (routine) without abnormal findings: Secondary | ICD-10-CM

## 2020-08-17 NOTE — Progress Notes (Signed)
Subjective:     Debra [name] is a 29 y.o. female here for a routine exam.  Patient's last menstrual period was 08/10/2020 (exact date). [id number] Birth Control Method:  None wants to get pregnanct again Menstrual Calendar(currently): irregular which is her pattern  Current complaints: none.   Current acute medical issues:  none   Recent Gynecologic History Patient's last menstrual period was 08/10/2020 (exact date). Last Pap: 2019,  normal Last mammogram: n/a,    Past Medical History:  Diagnosis Date  . Contraceptive management 11/27/2014  . Encounter for IUD removal 12/04/2014  . IUD (intrauterine device) in place 11/27/2014    Past Surgical History:  Procedure Laterality Date  . HERNIA REPAIR Bilateral 1998  . WISDOM TOOTH EXTRACTION      OB History    Gravida  2   Para  1   Term  1   Preterm      AB  1   Living  1     SAB      TAB  1   Ectopic      Multiple  0   Live Births  1           Social History   Socioeconomic History  . Marital status: Single    Spouse name: Not on file  . Number of children: Not on file  . Years of education: Not on file  . Highest education level: Not on file  Occupational History  . Not on file  Tobacco Use  . Smoking status: Current Every Day Smoker    Years: 8.00    Types: Cigarettes  . Smokeless tobacco: Never Used  . Tobacco comment: smokes 5 cig daily  Vaping Use  . Vaping Use: Never used  Substance and Sexual Activity  . Alcohol use: No    Comment: occ  . Drug use: No  . Sexual activity: Yes    Birth control/protection: None  Other Topics Concern  . Not on file  Social History Narrative  . Not on file   Social Determinants of Health   Financial Resource Strain: Low Risk   . Difficulty of Paying Living Expenses: Not hard at all  Food Insecurity: No Food Insecurity  . Worried About Programme researcher, broadcasting/film/video in the Last Year: Never true  . Ran Out of Food in the Last Year: Never true  Transportation  Needs: No Transportation Needs  . Lack of Transportation (Medical): No  . Lack of Transportation (Non-Medical): No  Physical Activity: Sufficiently Active  . Days of Exercise per Week: 7 days  . Minutes of Exercise per Session: 90 min  Stress: No Stress Concern Present  . Feeling of Stress : Only a little  Social Connections: Moderately Isolated  . Frequency of Communication with Friends and Family: More than three times a week  . Frequency of Social Gatherings with Friends and Family: Twice a week  . Attends Religious Services: More than 4 times per year  . Active Member of Clubs or Organizations: No  . Attends Banker Meetings: Never  . Marital Status: Never married    Family History  Problem Relation Age of Onset  . Healthy Mother   . Healthy Father   . Asthma Brother   . Other Maternal Grandmother        MVA  . Other Maternal Grandfather        fibrosis of lungs  . Cancer Paternal Grandmother   . COPD Paternal Grandmother   .  Cancer Paternal Grandfather        lung   . Heart attack Maternal Uncle     No current outpatient medications on file.  Review of Systems  Review of Systems  Constitutional: Negative for fever, chills, weight loss, malaise/fatigue and diaphoresis.  HENT: Negative for hearing loss, ear pain, nosebleeds, congestion, sore throat, neck pain, tinnitus and ear discharge.   Eyes: Negative for blurred vision, double vision, photophobia, pain, discharge and redness.  Respiratory: Negative for cough, hemoptysis, sputum production, shortness of breath, wheezing and stridor.   Cardiovascular: Negative for chest pain, palpitations, orthopnea, claudication, leg swelling and PND.  Gastrointestinal: negative for abdominal pain. Negative for heartburn, nausea, vomiting, diarrhea, constipation, blood in stool and melena.  Genitourinary: Negative for dysuria, urgency, frequency, hematuria and flank pain.  Musculoskeletal: Negative for myalgias, back  pain, joint pain and falls.  Skin: Negative for itching and rash.  Neurological: Negative for dizziness, tingling, tremors, sensory change, speech change, focal weakness, seizures, loss of consciousness, weakness and headaches.  Endo/Heme/Allergies: Negative for environmental allergies and polydipsia. Does not bruise/bleed easily.  Psychiatric/Behavioral: Negative for depression, suicidal ideas, hallucinations, memory loss and substance abuse. The patient is not nervous/anxious and does not have insomnia.        Objective:  Height 5\' 9"  (1.753 m), last menstrual period 08/10/2020.   Physical Exam  Vitals reviewed. Constitutional: She is oriented to person, place, and time. She appears well-developed and well-nourished.  HENT:  Head: Normocephalic and atraumatic.        Right Ear: External ear normal.  Left Ear: External ear normal.  Nose: Nose normal.  Mouth/Throat: Oropharynx is clear and moist.  Eyes: Conjunctivae and EOM are normal. Pupils are equal, round, and reactive to light. Right eye exhibits no discharge. Left eye exhibits no discharge. No scleral icterus.  Neck: Normal range of motion. Neck supple. No tracheal deviation present. No thyromegaly present.  Cardiovascular: Normal rate, regular rhythm, normal heart sounds and intact distal pulses.  Exam reveals no gallop and no friction rub.   No murmur heard. Respiratory: Effort normal and breath sounds normal. No respiratory distress. She has no wheezes. She has no rales. She exhibits no tenderness.  GI: Soft. Bowel sounds are normal. She exhibits no distension and no mass. There is no tenderness. There is no rebound and no guarding.  Genitourinary:  Breasts no masses skin changes or nipple changes bilaterally      Vulva is normal without lesions Vagina is pink moist without discharge Cervix normal in appearance and pap is done Uterus is normal size shape and contour Adnexa is negative with normal sized ovaries    Musculoskeletal: Normal range of motion. She exhibits no edema and no tenderness.  Neurological: She is alert and oriented to person, place, and time. She has normal reflexes. She displays normal reflexes. No cranial nerve deficit. She exhibits normal muscle tone. Coordination normal.  Skin: Skin is warm and dry. No rash noted. No erythema. No pallor.  Psychiatric: She has a normal mood and affect. Her behavior is normal. Judgment and thought content normal.       Medications Ordered at today's visit: No orders of the defined types were placed in this encounter.   Other orders placed at today's visit: No orders of the defined types were placed in this encounter.     Assessment:    Normal Gyn exam.    Plan:    Contraception: none. Follow up in: 1-3 years years.  No follow-ups on file.

## 2020-08-18 LAB — CYTOLOGY - PAP
Chlamydia: NEGATIVE
Comment: NEGATIVE
Comment: NEGATIVE
Comment: NORMAL
Diagnosis: NEGATIVE
High risk HPV: NEGATIVE
Neisseria Gonorrhea: NEGATIVE

## 2020-09-22 ENCOUNTER — Encounter: Payer: Medicaid Other | Admitting: Family Medicine

## 2020-09-24 ENCOUNTER — Encounter: Payer: Self-pay | Admitting: Family Medicine

## 2020-11-05 ENCOUNTER — Encounter: Payer: Medicaid Other | Admitting: Family Medicine

## 2021-04-26 ENCOUNTER — Other Ambulatory Visit: Payer: Self-pay

## 2021-04-26 ENCOUNTER — Ambulatory Visit (INDEPENDENT_AMBULATORY_CARE_PROVIDER_SITE_OTHER): Payer: Medicaid Other

## 2021-04-26 VITALS — BP 119/82 | HR 79 | Ht 69.0 in | Wt 134.8 lb

## 2021-04-26 DIAGNOSIS — Z3200 Encounter for pregnancy test, result unknown: Secondary | ICD-10-CM | POA: Diagnosis not present

## 2021-04-26 DIAGNOSIS — Z3490 Encounter for supervision of normal pregnancy, unspecified, unspecified trimester: Secondary | ICD-10-CM

## 2021-04-26 LAB — POCT URINE PREGNANCY: Preg Test, Ur: POSITIVE — AB

## 2021-04-26 NOTE — Progress Notes (Signed)
   NURSE VISIT- PREGNANCY CONFIRMATION   SUBJECTIVE:  Debra [name] is a 30 y.o. G6P1011 female at Unknown by uncertain LMP of No LMP recorded (lmp unknown). Patient is pregnant. Here for pregnancy confirmation.  Home pregnancy test: positive x 1  She reports no complaints.  She is taking prenatal vitamins.    OBJECTIVE:  BP 119/82 (BP Location: Right Arm, Patient Position: Sitting, Cuff Size: Normal)   Pulse 79   Ht 5\' 9"  (1.753 m)   Wt 134 lb 12.8 oz (61.1 kg)   LMP  (LMP Unknown)   BMI 19.91 kg/m   Appears well, in no apparent distress OB History  Gravida Para Term Preterm AB Living  3 1 1   1 1   SAB IAB Ectopic Multiple Live Births    1   0 1    # Outcome Date GA Lbr Len/2nd Weight Sex Delivery Anes PTL Lv  3 Current           2 Term 11/13/17 [redacted]w[redacted]d 00:53 / 00:42 5 lb 14.7 oz (2.685 kg) F Vag-Spont Local  LIV  1 IAB 2009            Results for orders placed or performed in visit on 04/26/21 (from the past 24 hour(s))  POCT urine pregnancy   Collection Time: 04/26/21  4:04 PM  Result Value Ref Range   Preg Test, Ur Positive (A) Negative    ASSESSMENT: Positive pregnancy test, Unknown by LMP    PLAN: Schedule for dating ultrasound based on quant HCG lab results Prenatal vitamins: plans to begin OTC ASAP   Nausea medicines: not currently needed   OB packet given: Yes  Hunt Zajicek A Cohl Behrens  04/26/2021 4:09 PM

## 2021-04-27 ENCOUNTER — Other Ambulatory Visit: Payer: Self-pay | Admitting: Advanced Practice Midwife

## 2021-04-27 ENCOUNTER — Encounter: Payer: Self-pay | Admitting: Family Medicine

## 2021-04-27 ENCOUNTER — Telehealth: Payer: Self-pay | Admitting: Women's Health

## 2021-04-27 DIAGNOSIS — Z789 Other specified health status: Secondary | ICD-10-CM

## 2021-04-27 LAB — BETA HCG QUANT (REF LAB): hCG Quant: 5564 m[IU]/mL

## 2021-04-27 NOTE — Telephone Encounter (Signed)
Patient called stating that she had her HCG levels checked yesterday to see how long she is, patient states that she got her results but she doesn't understand it and she would like to know what to do next. Please contact pt

## 2021-05-17 ENCOUNTER — Other Ambulatory Visit: Payer: Self-pay | Admitting: Advanced Practice Midwife

## 2021-05-17 DIAGNOSIS — Z789 Other specified health status: Secondary | ICD-10-CM

## 2021-05-17 DIAGNOSIS — O3680X0 Pregnancy with inconclusive fetal viability, not applicable or unspecified: Secondary | ICD-10-CM

## 2021-05-19 ENCOUNTER — Ambulatory Visit (INDEPENDENT_AMBULATORY_CARE_PROVIDER_SITE_OTHER): Payer: Medicaid Other

## 2021-05-19 ENCOUNTER — Other Ambulatory Visit: Payer: Self-pay

## 2021-05-19 DIAGNOSIS — Z789 Other specified health status: Secondary | ICD-10-CM

## 2021-05-19 DIAGNOSIS — O3680X0 Pregnancy with inconclusive fetal viability, not applicable or unspecified: Secondary | ICD-10-CM | POA: Diagnosis not present

## 2021-05-19 NOTE — Progress Notes (Signed)
[place] 8+3 wks,single IUP with YS,positive fht 169 bpm,CRL 19.30 mm,normal ovaries

## 2021-05-25 ENCOUNTER — Encounter: Payer: Self-pay | Admitting: Women's Health

## 2021-05-25 ENCOUNTER — Telehealth: Payer: Self-pay | Admitting: Women's Health

## 2021-05-25 NOTE — Telephone Encounter (Signed)
Pt had dating U/S 05/19/21 - last night mild-moderate cramping & spotting x3 days & now blood is darker & stringy  (NTIT & New OB scheduled for 06/22/21)  Please advise & call pt

## 2021-05-25 NOTE — Telephone Encounter (Addendum)
Patient states she has had mild to moderate cramping & spotting for the last 3 days. Blood is now darker and stringy. Informed since we have a confirmed IUP, can come in for heartbeat check.  Pt states she is unable to come tomorrow but will come Friday.  Advised no heavy lifting and no sex for 7 days and to push fluids.  Pt verbalized understanding and agreeable to plan.

## 2021-05-27 ENCOUNTER — Other Ambulatory Visit (INDEPENDENT_AMBULATORY_CARE_PROVIDER_SITE_OTHER): Payer: Medicaid Other

## 2021-05-27 ENCOUNTER — Other Ambulatory Visit: Payer: Self-pay

## 2021-05-27 VITALS — BP 121/73 | HR 75

## 2021-05-27 DIAGNOSIS — Z3491 Encounter for supervision of normal pregnancy, unspecified, first trimester: Secondary | ICD-10-CM

## 2021-05-27 NOTE — Progress Notes (Signed)
   NURSE VISIT- Fetal Heart Rate Check  SUBJECTIVE:  Debra [name] is a 30 y.o. G71P1011 female at [redacted]w[redacted]d, here for a fetal heart rate check for vaginal spotting two days ago.    OBJECTIVE:  LMP  (LMP Unknown)   Appears well, no apparent distress  FHR 165 via doppler   ASSESSMENT: G3P1011 at [redacted]w[redacted]d with present fetal heart rate  PLAN: Follow-up as scheduled  Jobe Marker  05/27/2021 9:04 AM

## 2021-05-29 ENCOUNTER — Inpatient Hospital Stay (HOSPITAL_COMMUNITY): Payer: Medicaid Other

## 2021-05-29 ENCOUNTER — Inpatient Hospital Stay (HOSPITAL_COMMUNITY)
Admission: AD | Admit: 2021-05-29 | Discharge: 2021-05-29 | Disposition: A | Discharge status: Home / Self Care | Payer: Medicaid Other | Source: Ambulatory Visit | Attending: Family Medicine | Admitting: Family Medicine

## 2021-05-29 ENCOUNTER — Other Ambulatory Visit: Payer: Self-pay

## 2021-05-29 ENCOUNTER — Encounter (HOSPITAL_COMMUNITY): Payer: Self-pay | Admitting: Family Medicine

## 2021-05-29 DIAGNOSIS — O418X10 Other specified disorders of amniotic fluid and membranes, first trimester, not applicable or unspecified: Secondary | ICD-10-CM

## 2021-05-29 DIAGNOSIS — F17210 Nicotine dependence, cigarettes, uncomplicated: Secondary | ICD-10-CM | POA: Insufficient documentation

## 2021-05-29 DIAGNOSIS — O468X1 Other antepartum hemorrhage, first trimester: Secondary | ICD-10-CM

## 2021-05-29 DIAGNOSIS — O4690 Antepartum hemorrhage, unspecified, unspecified trimester: Secondary | ICD-10-CM

## 2021-05-29 DIAGNOSIS — O99331 Smoking (tobacco) complicating pregnancy, first trimester: Secondary | ICD-10-CM | POA: Diagnosis not present

## 2021-05-29 DIAGNOSIS — Z91030 Bee allergy status: Secondary | ICD-10-CM | POA: Diagnosis not present

## 2021-05-29 DIAGNOSIS — Z6790 Unspecified blood type, Rh positive: Secondary | ICD-10-CM

## 2021-05-29 DIAGNOSIS — Z3A09 9 weeks gestation of pregnancy: Secondary | ICD-10-CM | POA: Diagnosis not present

## 2021-05-29 DIAGNOSIS — O208 Other hemorrhage in early pregnancy: Secondary | ICD-10-CM | POA: Insufficient documentation

## 2021-05-29 LAB — CBC WITH DIFFERENTIAL/PLATELET
Abs Immature Granulocytes: 0.05 10*3/uL (ref 0.00–0.07)
Basophils Absolute: 0 10*3/uL (ref 0.0–0.1)
Basophils Relative: 0 %
Eosinophils Absolute: 0.2 10*3/uL (ref 0.0–0.5)
Eosinophils Relative: 2 %
HCT: 32.1 % — ABNORMAL LOW (ref 36.0–46.0)
Hemoglobin: 10.1 g/dL — ABNORMAL LOW (ref 12.0–15.0)
Immature Granulocytes: 1 %
Lymphocytes Relative: 25 %
Lymphs Abs: 1.9 10*3/uL (ref 0.7–4.0)
MCH: 23.3 pg — ABNORMAL LOW (ref 26.0–34.0)
MCHC: 31.5 g/dL (ref 30.0–36.0)
MCV: 74.1 fL — ABNORMAL LOW (ref 80.0–100.0)
Monocytes Absolute: 0.6 10*3/uL (ref 0.1–1.0)
Monocytes Relative: 8 %
Neutro Abs: 4.9 10*3/uL (ref 1.7–7.7)
Neutrophils Relative %: 64 %
Platelets: 194 10*3/uL (ref 150–400)
RBC: 4.33 MIL/uL (ref 3.87–5.11)
RDW: 25.3 % — ABNORMAL HIGH (ref 11.5–15.5)
WBC: 7.7 10*3/uL (ref 4.0–10.5)
nRBC: 0 % (ref 0.0–0.2)

## 2021-05-29 LAB — URINALYSIS, ROUTINE W REFLEX MICROSCOPIC
Bacteria, UA: NONE SEEN
Bilirubin Urine: NEGATIVE
Glucose, UA: NEGATIVE mg/dL
Ketones, ur: 5 mg/dL — AB
Leukocytes,Ua: NEGATIVE
Nitrite: NEGATIVE
Protein, ur: NEGATIVE mg/dL
RBC / HPF: >50 RBC/hpf — ABNORMAL HIGH (ref 0–5)
Specific Gravity, Urine: 1.023 (ref 1.005–1.030)
pH: 5 (ref 5.0–8.0)

## 2021-05-29 LAB — HCG, QUANTITATIVE, PREGNANCY: hCG, Beta Chain, Quant, S: 173900 m[IU]/mL — ABNORMAL HIGH (ref <5)

## 2021-05-29 LAB — WET PREP, GENITAL
Clue Cells Wet Prep HPF POC: NONE SEEN
Sperm: NONE SEEN
Trich, Wet Prep: NONE SEEN
Yeast Wet Prep HPF POC: NONE SEEN

## 2021-05-29 MED ORDER — FERROUS SULFATE 325 (65 FE) MG PO TABS: 325.0000 mg | ORAL_TABLET | ORAL | 3 refills | Status: DC

## 2021-05-29 NOTE — MAU Note (Signed)
Pt reports last week she was having some dark spotting off/on. Was seen Friday in office and everything was okay. Yesterday began having some red bleeding on tissue when she wiped and today it is heavier and has passed some clots. Denies pain.

## 2021-05-29 NOTE — Discharge Instructions (Addendum)
Safe Medications in Pregnancy    Acne: Benzoyl Peroxide Salicylic Acid  Backache/Headache: Tylenol: 2 regular strength every 4 hours OR              2 Extra strength every 6 hours  Colds/Coughs/Allergies: Benadryl (alcohol free) 25 mg every 6 hours as needed Breath right strips Claritin Cepacol throat lozenges Chloraseptic throat spray Cold-Eeze- up to three times per day Cough drops, alcohol free Flonase (by prescription only) Guaifenesin Mucinex Robitussin DM (plain only, alcohol free) Saline nasal spray/drops Sudafed (pseudoephedrine) & Actifed ** use only after [redacted] weeks gestation and if you do not have high blood pressure Tylenol Vicks Vaporub Zinc lozenges Zyrtec   Constipation: Colace Ducolax suppositories Fleet enema Glycerin suppositories Metamucil Milk of magnesia Miralax Senokot Smooth move tea  Diarrhea: Kaopectate Imodium A-D  *NO pepto Bismol  Hemorrhoids: Anusol Anusol HC Preparation H Tucks  Indigestion: Tums Maalox Mylanta Zantac  Pepcid  Insomnia: Benadryl (alcohol free) 25mg every 6 hours as needed Tylenol PM Unisom, no Gelcaps  Leg Cramps: Tums MagGel  Nausea/Vomiting:  Bonine Dramamine Emetrol Ginger extract Sea bands Meclizine  Nausea medication to take during pregnancy:  Unisom (doxylamine succinate 25 mg tablets) Take one tablet daily at bedtime. If symptoms are not adequately controlled, the dose can be increased to a maximum recommended dose of two tablets daily (1/2 tablet in the morning, 1/2 tablet mid-afternoon and one at bedtime). Vitamin B6 100mg tablets. Take one tablet twice a day (up to 200 mg per day).  Skin Rashes: Aveeno products Benadryl cream or 25mg every 6 hours as needed Calamine Lotion 1% cortisone cream  Yeast infection: Gyne-lotrimin 7 Monistat 7   **If taking multiple medications, please check labels to avoid duplicating the same active ingredients **take  medication as directed on the label ** Do not exceed 4000 mg of tylenol in 24 hours **Do not take medications that contain aspirin or ibuprofen         Your bloodwork shows that you have anemia. I have sent you a prescription for ferrous sulfate 325mg. Take 1 tablet, every other day. Take iron without food, if possible. If it must be taken with food due to upset stomach, take with orange juice. Avoid taking with milk, cereal, tea, coffee and eggs. Common side effects of iron include: nausea, vomiting gas, constipation, diarrhea and black or green stool. If side effects prevent taking, you may take with orange juice to decrease stomach upset and increase absorption. Please contact our office if you have any questions or concerns. If you have side effects that are intolerable, please contact our office so your medication can be adjusted. 

## 2021-05-29 NOTE — MAU Provider Note (Addendum)
History     CSN: [id number]  Arrival date and time: 05/29/21 2035   Event Date/Time   First Provider Initiated Contact with Patient 05/29/21 2118      Chief Complaint  Patient presents with   Vaginal Bleeding   [name] is a 30 y.o. G3P1011 at 3060w6d who presents to MAU for vaginal bleeding which began last night. Patient reports she had some dark brown spotting when wiping earlier this week and was told by her OB that she had a [facility] that would be watched. Patient presents today to MAU as the bleeding has slowly increased since last night. Originally the bleeding last night was bright red only present when wiping, but then turned in to enough bleeding where she had to wear a panty liner and changed several times today, and she also passed one small, stringy clot that she showed a photo of to the provider. Patient reports she had about 20 minutes of pelvic pain earlier today, but denies any additional pelvic or abdominal pain. Patient shows provider bleeding on pad in MAU, which is minimal and without clotting.  Blood Type? O Positive Current PNC & next appt? FT, 06/22/2021 for NOB  Pt denies vaginal discharge/odor/itching. Pt denies N/V, abdominal pain, constipation, diarrhea, or urinary problems. Pt denies fever, chills, fatigue, sweating or changes in appetite. Pt denies SOB or chest pain. Pt denies dizziness, HA, light-headedness, weakness.   OB History     Gravida  3   Para  1   Term  1   Preterm      AB  1   Living  1      SAB      IAB  1   Ectopic      Multiple  0   Live Births  1           Past Medical History:  Diagnosis Date   Contraceptive management 11/27/2014   Encounter for IUD removal 12/04/2014   IUD (intrauterine device) in place 11/27/2014    Past Surgical History:  Procedure Laterality Date   HERNIA REPAIR Bilateral 1998   WISDOM TOOTH EXTRACTION      Family History  Problem Relation Age of Onset   Healthy Mother     Healthy Father    Asthma Brother    Other Maternal Grandmother        MVA   Other Maternal Grandfather        fibrosis of lungs   Cancer Paternal Grandmother    COPD Paternal Grandmother    Cancer Paternal Grandfather        lung    Heart attack Maternal Uncle     Social History   Tobacco Use   Smoking status: Every Day    Packs/day: 0.50    Years: 8.00    Pack years: 4.00    Types: Cigarettes   Smokeless tobacco: Never   Tobacco comments:    smokes 5 cig daily  Vaping Use   Vaping Use: Never used  Substance Use Topics   Alcohol use: No    Comment: occ   Drug use: No    Allergies:  Allergies  Allergen Reactions   Bee Venom Anaphylaxis and Hives    Medications Prior to Admission  Medication Sig Dispense Refill Last Dose   Prenatal Vit-Fe Fumarate-FA (PRENATAL VITAMIN PO) Take by mouth.   05/29/2021   Review of Systems  Constitutional:  Negative for chills, diaphoresis, fatigue and fever.  Eyes:  Negative  for visual disturbance.  Respiratory:  Negative for shortness of breath.   Cardiovascular:  Negative for chest pain.  Gastrointestinal:  Negative for abdominal pain, constipation, diarrhea, nausea and vomiting.  Genitourinary:  Positive for vaginal bleeding. Negative for dysuria, flank pain, frequency, pelvic pain, urgency and vaginal discharge.  Neurological:  Negative for dizziness, weakness, light-headedness and headaches.   Physical Exam   Blood pressure 124/77, pulse 91, temperature 98.4 F (36.9 C), resp. rate 16, height 5\' 9"  (1.753 m), weight 60.3 kg, SpO2 99 %.  Patient Vitals for the past 24 hrs:  BP Temp Pulse Resp SpO2 Height Weight  05/29/21 2106 124/77 -- 91 16 -- -- --  05/29/21 2053 130/73 98.4 F (36.9 C) (!) 101 -- 99 % 5\' 9"  (1.753 m) 60.3 kg   Physical Exam Vitals and nursing note reviewed.  Constitutional:      General: She is not in acute distress.    Appearance: Normal appearance. She is not ill-appearing, toxic-appearing or  diaphoretic.  HENT:     Head: Normocephalic and atraumatic.  Pulmonary:     Effort: Pulmonary effort is normal.  Neurological:     Mental Status: She is alert and oriented to person, place, and time.  Psychiatric:        Mood and Affect: Mood normal.        Behavior: Behavior normal.        Thought Content: Thought content normal.        Judgment: Judgment normal.   Results for orders placed or performed during the hospital encounter of 05/29/21 (from the past 24 hour(s))  Wet prep, genital     Status: Abnormal   Collection Time: 05/29/21  9:00 PM   Specimen: Vaginal  Result Value Ref Range   Yeast Wet Prep HPF POC NONE SEEN NONE SEEN   Trich, Wet Prep NONE SEEN NONE SEEN   Clue Cells Wet Prep HPF POC NONE SEEN NONE SEEN   WBC, Wet Prep HPF POC MODERATE (A) NONE SEEN   Sperm NONE SEEN   CBC with Differential/Platelet     Status: Abnormal   Collection Time: 05/29/21  9:16 PM  Result Value Ref Range   WBC 7.7 4.0 - 10.5 K/uL   RBC 4.33 3.87 - 5.11 MIL/uL   Hemoglobin 10.1 (L) 12.0 - 15.0 g/dL   HCT 07/30/21 (L) 07/30/21 - 63.7 %   MCV 74.1 (L) 80.0 - 100.0 fL   MCH 23.3 (L) 26.0 - 34.0 pg   MCHC 31.5 30.0 - 36.0 g/dL   RDW 85.8 (H) 85.0 - 27.7 %   Platelets 194 150 - 400 K/uL   nRBC 0.0 0.0 - 0.2 %   Neutrophils Relative % 64 %   Neutro Abs 4.9 1.7 - 7.7 K/uL   Lymphocytes Relative 25 %   Lymphs Abs 1.9 0.7 - 4.0 K/uL   Monocytes Relative 8 %   Monocytes Absolute 0.6 0.1 - 1.0 K/uL   Eosinophils Relative 2 %   Eosinophils Absolute 0.2 0.0 - 0.5 K/uL   Basophils Relative 0 %   Basophils Absolute 0.0 0.0 - 0.1 K/uL   Immature Granulocytes 1 %   Abs Immature Granulocytes 0.05 0.00 - 0.07 K/uL  hCG, quantitative, pregnancy     Status: Abnormal   Collection Time: 05/29/21  9:16 PM  Result Value Ref Range   hCG, Beta Chain, Quant, S 173,900 (H) <5 mIU/mL  Urinalysis, Routine w reflex microscopic Urine, Clean Catch     Status:  Abnormal   Collection Time: 05/29/21  9:35 PM  Result  Value Ref Range   Color, Urine YELLOW YELLOW   APPearance CLEAR CLEAR   Specific Gravity, Urine 1.023 1.005 - 1.030   pH 5.0 5.0 - 8.0   Glucose, UA NEGATIVE NEGATIVE mg/dL   Hgb urine dipstick MODERATE (A) NEGATIVE   Bilirubin Urine NEGATIVE NEGATIVE   Ketones, ur 5 (A) NEGATIVE mg/dL   Protein, ur NEGATIVE NEGATIVE mg/dL   Nitrite NEGATIVE NEGATIVE   Leukocytes,Ua NEGATIVE NEGATIVE   RBC / HPF >50 (H) 0 - 5 RBC/hpf   WBC, UA 0-5 0 - 5 WBC/hpf   Bacteria, UA NONE SEEN NONE SEEN   Mucus PRESENT    US OB Comp Less 14 Wks  Result Date: 05/23/2021 Formatting of this result is different from the original. Images from the original result were not included.  Center for [facility] @ [facility] [address] [place] [address]                                                                                               DATING AND VIABILITY SONOGRAM Yacine Droz is a 30 y.o. year old G3P1011 unknown LMP.   She has regular menstrual cycles.   She is here today for a confirmatory initial sonogram. GESTATION: SINGLETON   FETAL ACTIVITY:          Heart rate         169          CERVIX: Appears closed ADNEXA: The ovaries are normal. GESTATIONAL AGE AND  BIOMETRICS: Gestational criteria: Estimated Date of Delivery: unknown LMP Previous Scans:0    CROWN RUMP LENGTH           19.30 mm         8+3 weeks                                                                      AVERAGE EGA(BY THIS SCAN):  8+3 weeks WORKING EDD( early ultrasound ):  12/26/2021  TECHNICIAN COMMENTS: [place] 8+3 wks,single IUP with YS,positive fht 169 bpm,CRL 19.30 mm,normal ovaries A copy of this report including all images has been saved and backed up to a second source for retrieval if needed. All measures and details of the anatomical scan, placentation, fluid volume and pelvic anatomy are contained in that report. Amber Flora Lipps 05/19/2021 4:07 PM Clinical Impression and recommendations: I have reviewed the sonogram results  above, combined with the patient's current clinical course, below are my impressions and any appropriate recommendations for management based on the sonographic findings. Viable early IUP G3P1011 Estimated Date of Delivery: 12/26/21 by today's sonogram Normal general sonographic findings Recommend routine care unless otherwise clinically indicated Lazaro Arms 05/23/2021 10:46 AM  [place] MFM OB Transvaginal  Result Date: 05/29/2021 ----------------------------------------------------------------------  OBSTETRICS REPORT                         (  Signed Final 05/29/2021 10:36 pm) ---------------------------------------------------------------------- Patient Info  ID #:        [id number]                          D.O.B.:  08/07/1991 (29 yrs)  Name:        Debra [name]                 Visit Date: 05/29/2021 10:22 pm ---------------------------------------------------------------------- Performed By  Attending:         Noralee Space MD        Secondary Phy.:    [facility] MAU/Triage  Performed By:      Earley Brooke     [facility]  Referred By:       Marylen Ponto ---------------------------------------------------------------------- Orders  #   Description                          Code         Ordered By  1   [place] MFM OB TRANSVAGINAL               40981.1      Arslan Kier ----------------------------------------------------------------------  #   Order #                    Accession #                 Episode #  1   [id number]                  [id number]                  [id number] ---------------------------------------------------------------------- Indications  [redacted] weeks gestation of pregnancy                  Z3A.09  Vaginal bleeding in pregnancy, first trimester  O46.91 ---------------------------------------------------------------------- Fetal Evaluation  Num Of Fetuses:          1  Gest. Sac:               Intrauterine,  large subchorionic bleed  Yolk Sac:                Visualized  Fetal Pole:              Visualized  Fetal Heart Rate(bpm):   174  Cardiac Activity:        Observed  Amniotic Fluid  AFI FV:      Within normal limits ---------------------------------------------------------------------- Biometry  CRL:      32.2   mm     G. Age:  9w 6d                    EDD:   12/26/21 ---------------------------------------------------------------------- OB History  Gravidity:     3         Term:  1          Prem:  0        SAB:   0  TOP:  1       Ectopic: 0         Living: 1 ---------------------------------------------------------------------- Gestational Age  Best:           9w 6d     Det. By:  Marcella Dubs           EDD:  12/26/21 ---------------------------------------------------------------------- Anatomy  Other:   Increased NT. See comments ---------------------------------------------------------------------- Cervix Uterus Adnexa  Cervix  Normal appearance by transvaginal scan  Right Ovary  Within normal limits.  Left Ovary  Not visualized. ---------------------------------------------------------------------- Impression  Patient is being evaluated at the MAU for c/o vaginal bleeding  (spotting).  Transvaginal ultrasound was performed to better-assess the  pregnancy. A single intrauterine gestation is seen. The CRL  measurement consistent with her previously-established dates  and good fetal heart activity is seen. The nuchal translucency  appears increased (possible cystic hygroma). However, the  pregnancy is too early to give a clear opinion.  I recommend MFM ultrasound in 2 weeks to evaluate the  pregnancy.  Early pregnancy bleeding and increased NT are associated with  increased risk of spontaneous pregnancy loss. ---------------------------------------------------------------------- Recommendations  -MFM ultrasound in 2 weeks. ----------------------------------------------------------------------                   Noralee Space, MD Electronically Signed Final Report   05/29/2021 10:36 pm ----------------------------------------------------------------------   MAU Course  Procedures  MDM -r/o SAB -UA: mod hgb/5ketones, otherwise WNL -CBC: WNL for pregnancy -[place]: +FHR 174, possible cystic hygroma, large [facility], call from Dr. Judeth Cornfield that patient may have a cystic hygroma, but it is too early to tell and patient needs f/u US in about 2 weeks during 11th week of pregnancy. Patient informed and message sent to FT to schedule. -hCG: pending at time of discharge -ABO: O Positive -WetPrep: WNL -GC/CT collected -pt discharged to home in stable condition  Orders Placed This Encounter  Procedures   Wet prep, genital    Standing Status:   Standing    Number of Occurrences:   1   [place] MFM OB Transvaginal    Standing Status:   Standing    Number of Occurrences:   1    Order Specific Question:   Symptom/Reason for Exam    Answer:   Vaginal bleeding in pregnancy [[id number]]   CBC with Differential/Platelet    Standing Status:   Standing    Number of Occurrences:   1   hCG, quantitative, pregnancy    Standing Status:   Standing    Number of Occurrences:   1   Urinalysis, Routine w reflex microscopic Urine, Clean Catch    Standing Status:   Standing    Number of Occurrences:   1   AMB referral to maternal fetal medicine    Referral Priority:   Routine    Referral Type:   Consultation    Referral Reason:   Specialty Services Required    Number of Visits Requested:   1   Discharge patient    Order Specific Question:   Discharge disposition    Answer:   01-Home or Self Care [1]    Order Specific Question:   Discharge patient date    Answer:   05/29/2021   Meds ordered this encounter  Medications   ferrous sulfate 325 (65 FE) MG tablet    Sig: Take 1 tablet (325 mg total) by mouth every other day.    Dispense:  30 tablet    Refill:  3  Order Specific Question:   Supervising Provider    Answer:    Reva Bores [[id number]]    Assessment and Plan   1. Subchorionic hemorrhage of placenta in first trimester, single or unspecified fetus   2. Vaginal bleeding in pregnancy   3. Blood type, Rh positive   4. [redacted] weeks gestation of pregnancy    Allergies as of 05/29/2021       Reactions   Bee Venom Anaphylaxis, Hives        Medication List     TAKE these medications    ferrous sulfate 325 (65 FE) MG tablet Take 1 tablet (325 mg total) by mouth every other day.   PRENATAL VITAMIN PO Take by mouth.       -return MAU precautions given -pt discharged to home in stable condition  Joni Reining E Jinna Weinman 05/29/2021, 11:22 PM

## 2021-05-30 ENCOUNTER — Other Ambulatory Visit: Payer: Self-pay | Admitting: Women's Health

## 2021-05-30 DIAGNOSIS — O283 Abnormal ultrasonic finding on antenatal screening of mother: Secondary | ICD-10-CM

## 2021-05-30 LAB — GC/CHLAMYDIA PROBE AMP (~~LOC~~) NOT AT ARMC
Chlamydia: NEGATIVE
Comment: NEGATIVE
Comment: NORMAL
Neisseria Gonorrhea: NEGATIVE

## 2021-06-20 ENCOUNTER — Ambulatory Visit: Payer: Medicaid Other | Attending: Women's Health

## 2021-06-20 ENCOUNTER — Ambulatory Visit (HOSPITAL_BASED_OUTPATIENT_CLINIC_OR_DEPARTMENT_OTHER): Payer: Medicaid Other | Admitting: Obstetrics

## 2021-06-20 ENCOUNTER — Encounter: Payer: Self-pay | Admitting: *Deleted

## 2021-06-20 ENCOUNTER — Other Ambulatory Visit: Payer: Self-pay

## 2021-06-20 ENCOUNTER — Other Ambulatory Visit: Payer: Self-pay | Admitting: *Deleted

## 2021-06-20 ENCOUNTER — Ambulatory Visit: Payer: Medicaid Other | Attending: Obstetrics

## 2021-06-20 ENCOUNTER — Ambulatory Visit: Payer: Medicaid Other | Admitting: *Deleted

## 2021-06-20 VITALS — BP 123/73 | HR 74

## 2021-06-20 DIAGNOSIS — O418X10 Other specified disorders of amniotic fluid and membranes, first trimester, not applicable or unspecified: Secondary | ICD-10-CM | POA: Insufficient documentation

## 2021-06-20 DIAGNOSIS — Z363 Encounter for antenatal screening for malformations: Secondary | ICD-10-CM | POA: Diagnosis not present

## 2021-06-20 DIAGNOSIS — Z3682 Encounter for antenatal screening for nuchal translucency: Secondary | ICD-10-CM | POA: Diagnosis not present

## 2021-06-20 DIAGNOSIS — Z3A13 13 weeks gestation of pregnancy: Secondary | ICD-10-CM

## 2021-06-20 DIAGNOSIS — O468X1 Other antepartum hemorrhage, first trimester: Secondary | ICD-10-CM

## 2021-06-20 DIAGNOSIS — O283 Abnormal ultrasonic finding on antenatal screening of mother: Secondary | ICD-10-CM

## 2021-06-20 DIAGNOSIS — O09291 Supervision of pregnancy with other poor reproductive or obstetric history, first trimester: Secondary | ICD-10-CM

## 2021-06-20 DIAGNOSIS — O209 Hemorrhage in early pregnancy, unspecified: Secondary | ICD-10-CM

## 2021-06-20 NOTE — Progress Notes (Signed)
MFM Note  Debra [name] was seen for an ultrasound exam as a possible cystic hygroma was noted on an ultrasound that was performed in the MAU two weeks ago. She had presented to the MAU due to vaginal bleeding. A subchorionic hematoma was also noted during that ultrasound.  The patient still reports some vaginal spotting today.   On today's exam, the crown-rump length measures consistent with her gestational age.  An enlarged nuchal translucency of 3.74 mm was noted today.  A nasal bone was present.  The increased risk of fetal aneuploidy, a congenital heart defect, and other genetic syndromes that may not be detected via prenatal ultrasounds associated with an increased nuchal translucency was discussed.  Due to the increased risk of fetal aneuploidy associated with the increased nuchal translucency noted on today's exam, the patient was offered and declined a CVS for definitive diagnosis of fetal aneuploidy.  She would like to have a cell free DNA test drawn today and will consider invasive testing should the cell free DNA test be positive for aneuploidy.  A subchorionic hematoma continues to be noted today. Continued pelvic rest was advised.   The patient had a cell free DNA test (Materni T21) drawn today. Our genetic counselor will notify her regarding the results of this test.   The patient was advised that due to the increased risk of a congenital heart defect associated with the increased nuchal translucency, a fetal echocardiogram will be scheduled later in her pregnancy.  A follow up exam was scheduled in 3 weeks for assessment of fetal hydrops.   A detailed fetal anatomy scan has also been scheduled in our office at 19 weeks.   The patient stated that all of her questions were answered.   A total of 30 minutes was spent counseling and coordinating the care of this patient. Greater than 50% of the time was spent in direct face to face contact.   Recommendations:  Repeat  ultrasound in 3 weeks Detailed fetal anatomy scan at 19 weeks Fetal echocardiogram at 22 weeks Consider CVS or amniocentesis should her cell free DNA test show an increased risk of fetal aneuploidy

## 2021-06-21 ENCOUNTER — Encounter: Payer: Self-pay | Admitting: Women's Health

## 2021-06-21 DIAGNOSIS — O283 Abnormal ultrasonic finding on antenatal screening of mother: Secondary | ICD-10-CM | POA: Insufficient documentation

## 2021-06-22 ENCOUNTER — Encounter: Payer: Self-pay | Admitting: Women's Health

## 2021-06-22 ENCOUNTER — Other Ambulatory Visit: Payer: Self-pay

## 2021-06-22 ENCOUNTER — Ambulatory Visit: Payer: Medicaid Other | Admitting: *Deleted

## 2021-06-22 ENCOUNTER — Ambulatory Visit (INDEPENDENT_AMBULATORY_CARE_PROVIDER_SITE_OTHER): Payer: Medicaid Other | Admitting: Women's Health

## 2021-06-22 ENCOUNTER — Other Ambulatory Visit: Payer: Medicaid Other

## 2021-06-22 VITALS — BP 105/59 | HR 71 | Wt 135.0 lb

## 2021-06-22 DIAGNOSIS — Z3481 Encounter for supervision of other normal pregnancy, first trimester: Secondary | ICD-10-CM | POA: Diagnosis not present

## 2021-06-22 DIAGNOSIS — Z3480 Encounter for supervision of other normal pregnancy, unspecified trimester: Secondary | ICD-10-CM

## 2021-06-22 DIAGNOSIS — Z8679 Personal history of other diseases of the circulatory system: Secondary | ICD-10-CM | POA: Diagnosis not present

## 2021-06-22 DIAGNOSIS — Z3A13 13 weeks gestation of pregnancy: Secondary | ICD-10-CM | POA: Diagnosis not present

## 2021-06-22 DIAGNOSIS — Z8759 Personal history of other complications of pregnancy, childbirth and the puerperium: Secondary | ICD-10-CM

## 2021-06-22 DIAGNOSIS — Z3490 Encounter for supervision of normal pregnancy, unspecified, unspecified trimester: Secondary | ICD-10-CM | POA: Insufficient documentation

## 2021-06-22 LAB — POCT URINALYSIS DIPSTICK OB
Blood, UA: NEGATIVE
Glucose, UA: NEGATIVE
Ketones, UA: NEGATIVE
Leukocytes, UA: NEGATIVE
Nitrite, UA: NEGATIVE
POC,PROTEIN,UA: NEGATIVE

## 2021-06-22 MED ORDER — BLOOD PRESSURE MONITOR MISC: 0 refills | Status: DC

## 2021-06-22 MED ORDER — ASPIRIN 81 MG PO TBEC: 162.0000 mg | DELAYED_RELEASE_TABLET | Freq: Every day | ORAL | 2 refills | Status: DC

## 2021-06-22 NOTE — Progress Notes (Signed)
INITIAL OBSTETRICAL VISIT Patient name: Debra [name] MRN [id number]  Date of birth: 10-May-1991 Chief Complaint:   Initial Prenatal Visit  History of Present Illness:   Debra [name] is a 30 y.o. G12P1011 Caucasian female at [redacted]w[redacted]d by [place] at 8 weeks with an Estimated Date of Delivery: 12/26/21 being seen today for her initial obstetrical visit.   No LMP recorded (lmp unknown). Patient is pregnant. Her obstetrical history is significant for  term SVB w/ FGR, PPHTN .   Today she reports  some reflux, nausea- declines meds .  Had u/s in MAU earlier in pregnancy, ? Cystic hygroma, had u/s w/ MFM on Monday which revealed thickened NT 3.49mm, had MaterniT21 drawn, still pending. Declines CVS at the time.  Last pap 08/17/20. Results were: NILM w/ HRHPV negative  Depression screen [facility] 2/9 06/22/2021 08/17/2020 08/17/2020 10/23/2017 05/02/2017  Decreased Interest 0 0 0 0 0  Down, Depressed, Hopeless 0 0 0 - 0  PHQ - 2 Score 0 0 0 0 0  Altered sleeping 2 1 - - 0  Tired, decreased energy 1 0 - - 1  Change in appetite 0 0 - - 0  Feeling bad or failure about yourself  0 0 - - 0  Trouble concentrating 0 0 - - 0  Moving slowly or fidgety/restless 0 0 - - 0  Suicidal thoughts 0 0 - - 0  PHQ-9 Score 3 1 - - 1     GAD 7 : Generalized Anxiety Score 06/22/2021 08/17/2020  Nervous, Anxious, on Edge 1 0  Control/stop worrying 0 0  Worry too much - different things 1 0  Trouble relaxing 1 1  Restless 0 0  Easily annoyed or irritable 0 0  Afraid - awful might happen 0 0  Total GAD 7 Score 3 1     Review of Systems:   Pertinent items are noted in HPI Denies cramping/contractions, leakage of fluid, vaginal bleeding, abnormal vaginal discharge w/ itching/odor/irritation, headaches, visual changes, shortness of breath, chest pain, abdominal pain, severe nausea/vomiting, or problems with urination or bowel movements unless otherwise stated above.  Pertinent History Reviewed:  Reviewed past medical,surgical,  social, obstetrical and family history.  Reviewed problem list, medications and allergies. OB History  Gravida Para Term Preterm AB Living  3 1 1   1 1   SAB IAB Ectopic Multiple Live Births    1   0 1    # Outcome Date GA Lbr Len/2nd Weight Sex Delivery Anes PTL Lv  3 Current           2 Term 11/13/17 [redacted]w[redacted]d 00:53 / 00:42 5 lb 14.7 oz (2.685 kg) F Vag-Spont Local  LIV  1 IAB 2009           Physical Assessment:   Vitals:   06/22/21 1027  BP: (!) 105/59  Pulse: 71  Weight: 135 lb (61.2 kg)  Body mass index is 19.94 kg/m.       Physical Examination:  General appearance - well appearing, and in no distress  Mental status - alert, oriented to person, place, and time  Psych:  She has a normal mood and affect  Skin - warm and dry, normal color, no suspicious lesions noted  Chest - effort normal, all lung fields clear to auscultation bilaterally  Heart - normal rate and regular rhythm  Abdomen - soft, nontender  Extremities:  No swelling or varicosities noted  Thin prep pap is not done   Chaperone: N/A  TODAY'S FHT: 160 via doppler  Results for orders placed or performed in visit on 06/22/21 (from the past 24 hour(s))  POC Urinalysis Dipstick OB   Collection Time: 06/22/21 10:47 AM  Result Value Ref Range   Color, UA     Clarity, UA     Glucose, UA Negative Negative   Bilirubin, UA     Ketones, UA neg    Spec Grav, UA     Blood, UA neg    pH, UA     POC,PROTEIN,UA Negative Negative, Trace, Small (1+), Moderate (2+), Large (3+), 4+   Urobilinogen, UA     Nitrite, UA neg    Leukocytes, UA Negative Negative   Appearance     Odor      Assessment & Plan:  1) Low-Risk Pregnancy G3P1011 at [redacted]w[redacted]d with an Estimated Date of Delivery: 12/26/21   2) Initial OB visit  3) Thickened NT> on MFM u/s on Monday, MaterniT21 pending, declined CVS. Has f/u u/s w/ MFM on 8/22.   4) H/O fGR> plan EFW 28, 32, 36wks  5) H/O PPHTN> ASA now, get baseline labs  6) Smoker> Smokes  1/2pp/day down from 1+ppd, counseled x 3-83mins, advised cessation, discussed risks to fetus while pregnant, to infant pp, and to herself. Offered QuitlineNC, accepted, referral sent.     Meds:  Meds ordered this encounter  Medications   Blood Pressure Monitor MISC    Sig: For regular home bp monitoring during pregnancy    Dispense:  1 each    Refill:  0    Z34.81 Please mail to patient   aspirin 81 MG EC tablet    Sig: Take 2 tablets (162 mg total) by mouth daily. Swallow whole.    Dispense:  180 tablet    Refill:  2    Order Specific Question:   Supervising Provider    Answer:   Duane Lope H [[id number]]    Initial labs obtained Continue prenatal vitamins Reviewed n/v relief measures and warning s/s to report Reviewed recommended weight gain based on pre-gravid BMI Encouraged well-balanced diet Genetic & carrier screening discussed: requests AFP & [facility], has done NIPS w/ MFM, declines [facility] Ultrasound discussed; fetal survey: requested CCNC completed> form faxed if has or is planning to apply for medicaid The nature of [employer] for [employer] with multiple MDs and other Advanced Practice Providers was explained to patient; also emphasized that fellows, residents, and students are part of our team. Does not have home bp cuff. Office bp cuff given: no. Rx sent: yes. Check bp weekly, let us know if consistently >140/90.   Follow-up: Return in about 4 weeks (around 07/20/2021) for LROB, CNM, in person.   Orders Placed This Encounter  Procedures   Urine Culture   Pain Management Screening Profile (10S)   Comprehensive metabolic panel   Protein / creatinine ratio, urine   CBC/D/Plt+RPR+Rh+ABO+RubIgG...   Hgb Fractionation Cascade   POC Urinalysis Dipstick OB    Cheral Marker CNM, [facility] 06/22/2021 11:22 AM

## 2021-06-22 NOTE — Patient Instructions (Signed)
Debra [name], thank you for choosing our office today! We appreciate the opportunity to meet your healthcare needs. You may receive a short survey by mail, e-mail, or through [employer]. If you are happy with your care we would appreciate if you could take just a few minutes to complete the survey questions. We read all of your comments and take your feedback very seriously. Thank you again for choosing our office.  Center for [employer] [facility] Team at [facility]  [facility] ([address] [place], [place] [address]) Entrance C, located off of [address] Free 24/7 valet parking   Nausea & Vomiting Have saltine crackers or pretzels by your bed and eat a few bites before you raise your head out of bed in the morning Eat small frequent meals throughout the day instead of large meals Drink plenty of fluids throughout the day to stay hydrated, just don't drink a lot of fluids with your meals.  This can make your stomach fill up faster making you feel sick Do not brush your teeth right after you eat Products with real ginger are good for nausea, like ginger ale and ginger hard candy Make sure it says made with real ginger! Sucking on sour candy like lemon heads is also good for nausea If your prenatal vitamins make you nauseated, take them at night so you will sleep through the nausea Sea Bands If you feel like you need medicine for the nausea & vomiting please let us know If you are unable to keep any fluids or food down please let us know   Constipation Drink plenty of fluid, preferably water, throughout the day Eat foods high in fiber such as fruits, vegetables, and grains Exercise, such as walking, is a good way to keep your bowels regular Drink warm fluids, especially warm prune juice, or decaf coffee Eat a 1/2 cup of real oatmeal (not instant), 1/2 cup applesauce, and 1/2-1 cup warm prune juice every day If needed, you may take Colace (docusate sodium) stool  softener once or twice a day to help keep the stool soft.  If you still are having problems with constipation, you may take Miralax once daily as needed to help keep your bowels regular.   Home Blood Pressure Monitoring for Patients   Your provider has recommended that you check your blood pressure (BP) at least once a week at home. If you do not have a blood pressure cuff at home, one will be provided for you. Contact your provider if you have not received your monitor within 1 week.   Helpful Tips for Accurate Home Blood Pressure Checks  Don't smoke, exercise, or drink caffeine 30 minutes before checking your BP Use the restroom before checking your BP (a full bladder can raise your pressure) Relax in a comfortable upright chair Feet on the ground Left arm resting comfortably on a flat surface at the level of your heart Legs uncrossed Back supported Sit quietly and don't talk Place the cuff on your bare arm Adjust snuggly, so that only two fingertips can fit between your skin and the top of the cuff Check 2 readings separated by at least one minute Keep a log of your BP readings For a visual, please reference this diagram: http://ccnc.care/bpdiagram  Provider Name: [facility]     Phone: [phone]  Zone 1: ALL CLEAR  Continue to monitor your symptoms:  BP reading is less than 140 (top number) or less than 90 (bottom  number)  No right upper stomach pain No headaches or seeing spots No feeling nauseated or throwing up No swelling in face and hands  Zone 2: CAUTION Call your doctor's office for any of the following:  BP reading is greater than 140 (top number) or greater than 90 (bottom number)  Stomach pain under your ribs in the middle or right side Headaches or seeing spots Feeling nauseated or throwing up Swelling in face and hands  Zone 3: EMERGENCY  Seek immediate medical care if you have any of the following:  BP reading is greater than160 (top number) or  greater than 110 (bottom number) Severe headaches not improving with Tylenol Serious difficulty catching your breath Any worsening symptoms from Zone 2    First Trimester of Pregnancy The first trimester of pregnancy is from week 1 until the end of week 12 (months 1 through 3). A week after a sperm fertilizes an egg, the egg will implant on the wall of the uterus. This embryo will begin to develop into a baby. Genes from you and your partner are forming the baby. The female genes determine whether the baby is a boy or a girl. At 6-8 weeks, the eyes and face are formed, and the heartbeat can be seen on ultrasound. At the end of 12 weeks, all the baby's organs are formed.  Now that you are pregnant, you will want to do everything you can to have a healthy baby. Two of the most important things are to get good prenatal care and to follow your health care provider's instructions. Prenatal care is all the medical care you receive before the baby's birth. This care will help prevent, find, and treat any problems during the pregnancy and childbirth. BODY CHANGES Your body goes through many changes during pregnancy. The changes vary from woman to woman.  You may gain or lose a couple of pounds at first. You may feel sick to your stomach (nauseous) and throw up (vomit). If the vomiting is uncontrollable, call your health care provider. You may tire easily. You may develop headaches that can be relieved by medicines approved by your health care provider. You may urinate more often. Painful urination may mean you have a bladder infection. You may develop heartburn as a result of your pregnancy. You may develop constipation because certain hormones are causing the muscles that push waste through your intestines to slow down. You may develop hemorrhoids or swollen, bulging veins (varicose veins). Your breasts may begin to grow larger and become tender. Your nipples may stick out more, and the tissue that  surrounds them (areola) may become darker. Your gums may bleed and may be sensitive to brushing and flossing. Dark spots or blotches (chloasma, mask of pregnancy) may develop on your face. This will likely fade after the baby is born. Your menstrual periods will stop. You may have a loss of appetite. You may develop cravings for certain kinds of food. You may have changes in your emotions from day to day, such as being excited to be pregnant or being concerned that something may go wrong with the pregnancy and baby. You may have more vivid and strange dreams. You may have changes in your hair. These can include thickening of your hair, rapid growth, and changes in texture. Some women also have hair loss during or after pregnancy, or hair that feels dry or thin. Your hair will most likely return to normal after your baby is born. WHAT TO EXPECT AT YOUR PRENATAL  VISITS During a routine prenatal visit: You will be weighed to make sure you and the baby are growing normally. Your blood pressure will be taken. Your abdomen will be measured to track your baby's growth. The fetal heartbeat will be listened to starting around week 10 or 12 of your pregnancy. Test results from any previous visits will be discussed. Your health care provider may ask you: How you are feeling. If you are feeling the baby move. If you have had any abnormal symptoms, such as leaking fluid, bleeding, severe headaches, or abdominal cramping. If you have any questions. Other tests that may be performed during your first trimester include: Blood tests to find your blood type and to check for the presence of any previous infections. They will also be used to check for low iron levels (anemia) and Rh antibodies. Later in the pregnancy, blood tests for diabetes will be done along with other tests if problems develop. Urine tests to check for infections, diabetes, or protein in the urine. An ultrasound to confirm the proper growth  and development of the baby. An amniocentesis to check for possible genetic problems. Fetal screens for spina bifida and Down syndrome. You may need other tests to make sure you and the baby are doing well. HOME CARE INSTRUCTIONS  Medicines Follow your health care provider's instructions regarding medicine use. Specific medicines may be either safe or unsafe to take during pregnancy. Take your prenatal vitamins as directed. If you develop constipation, try taking a stool softener if your health care provider approves. Diet Eat regular, well-balanced meals. Choose a variety of foods, such as meat or vegetable-based protein, fish, milk and low-fat dairy products, vegetables, fruits, and whole grain breads and cereals. Your health care provider will help you determine the amount of weight gain that is right for you. Avoid raw meat and uncooked cheese. These carry germs that can cause birth defects in the baby. Eating four or five small meals rather than three large meals a day may help relieve nausea and vomiting. If you start to feel nauseous, eating a few soda crackers can be helpful. Drinking liquids between meals instead of during meals also seems to help nausea and vomiting. If you develop constipation, eat more high-fiber foods, such as fresh vegetables or fruit and whole grains. Drink enough fluids to keep your urine clear or pale yellow. Activity and Exercise Exercise only as directed by your health care provider. Exercising will help you: Control your weight. Stay in shape. Be prepared for labor and delivery. Experiencing pain or cramping in the lower abdomen or low back is a good sign that you should stop exercising. Check with your health care provider before continuing normal exercises. Try to avoid standing for long periods of time. Move your legs often if you must stand in one place for a long time. Avoid heavy lifting. Wear low-heeled shoes, and practice good posture. You may  continue to have sex unless your health care provider directs you otherwise. Relief of Pain or Discomfort Wear a good support bra for breast tenderness.   Take warm sitz baths to soothe any pain or discomfort caused by hemorrhoids. Use hemorrhoid cream if your health care provider approves.   Rest with your legs elevated if you have leg cramps or low back pain. If you develop varicose veins in your legs, wear support hose. Elevate your feet for 15 minutes, 3-4 times a day. Limit salt in your diet. Prenatal Care Schedule your prenatal visits by the  twelfth week of pregnancy. They are usually scheduled monthly at first, then more often in the last 2 months before delivery. Write down your questions. Take them to your prenatal visits. Keep all your prenatal visits as directed by your health care provider. Safety Wear your seat belt at all times when driving. Make a list of emergency phone numbers, including numbers for family, friends, the hospital, and police and fire departments. General Tips Ask your health care provider for a referral to a local prenatal education class. Begin classes no later than at the beginning of month 6 of your pregnancy. Ask for help if you have counseling or nutritional needs during pregnancy. Your health care provider can offer advice or refer you to specialists for help with various needs. Do not use hot tubs, steam rooms, or saunas. Do not douche or use tampons or scented sanitary pads. Do not cross your legs for long periods of time. Avoid cat litter boxes and soil used by cats. These carry germs that can cause birth defects in the baby and possibly loss of the fetus by miscarriage or stillbirth. Avoid all smoking, herbs, alcohol, and medicines not prescribed by your health care provider. Chemicals in these affect the formation and growth of the baby. Schedule a dentist appointment. At home, brush your teeth with a soft toothbrush and be gentle when you floss. SEEK  MEDICAL CARE IF:  You have dizziness. You have mild pelvic cramps, pelvic pressure, or nagging pain in the abdominal area. You have persistent nausea, vomiting, or diarrhea. You have a bad smelling vaginal discharge. You have pain with urination. You notice increased swelling in your face, hands, legs, or ankles. SEEK IMMEDIATE MEDICAL CARE IF:  You have a fever. You are leaking fluid from your vagina. You have spotting or bleeding from your vagina. You have severe abdominal cramping or pain. You have rapid weight gain or loss. You vomit blood or material that looks like coffee grounds. You are exposed to [place] measles and have never had them. You are exposed to fifth disease or chickenpox. You develop a severe headache. You have shortness of breath. You have any kind of trauma, such as from a fall or a car accident. Document Released: 10/31/2001 Document Revised: 03/23/2014 Document Reviewed: 09/16/2013 [facility] Patient Information 2015 [place], [place]. This information is not intended to replace advice given to you by your health care provider. Make sure you discuss any questions you have with your health care provider.

## 2021-06-23 LAB — PMP SCREEN PROFILE (10S), URINE
Amphetamine Scrn, Ur: NEGATIVE ng/mL
BARBITURATE SCREEN URINE: NEGATIVE ng/mL
BENZODIAZEPINE SCREEN, URINE: NEGATIVE ng/mL
CANNABINOIDS UR QL SCN: NEGATIVE ng/mL
Cocaine (Metab) Scrn, Ur: NEGATIVE ng/mL
Creatinine(Crt), U: 195.3 mg/dL (ref 20.0–300.0)
Methadone Screen, Urine: NEGATIVE ng/mL
OXYCODONE+OXYMORPHONE UR QL SCN: NEGATIVE ng/mL
Opiate Scrn, Ur: NEGATIVE ng/mL
Ph of Urine: 5.6 (ref 4.5–8.9)
Phencyclidine Qn, Ur: NEGATIVE ng/mL
Propoxyphene Scrn, Ur: NEGATIVE ng/mL

## 2021-06-24 ENCOUNTER — Telehealth: Payer: Self-pay | Admitting: Obstetrics and Gynecology

## 2021-06-24 LAB — MATERNIT21 PLUS CORE+SCA
Fetal Fraction: 13
Monosomy X (Turner Syndrome): NOT DETECTED
Result (T21): NEGATIVE
Trisomy 13 (Patau syndrome): NEGATIVE
Trisomy 18 (Edwards syndrome): NEGATIVE
Trisomy 21 (Down syndrome): NEGATIVE
XXX (Triple X Syndrome): NOT DETECTED
XXY (Klinefelter Syndrome): NOT DETECTED
XYY (Jacobs Syndrome): NOT DETECTED

## 2021-06-24 NOTE — Telephone Encounter (Signed)
The patient was informed of the results of her recent MaterniT21 testing which yielded NEGATIVE results.  The patient's specimen showed DNA consistent with two copies of chromosomes 21, 18 and 13.  The sensitivity for trisomy 16, trisomy 71 and trisomy 17 using this testing are reported as 99.1%, 99.9% and 91.7% respectively.  Thus, while the results of this testing are highly accurate, they are not considered diagnostic at this time.  Should more definitive information be desired, the patient may still consider amniocentesis.   As requested to know by the patient, sex chromosome analysis was included for this sample.  Results are consistent with a female fetus. This is predicted with >99% accuracy. This testing also screens for sex chromosome conditions with greater than 96% accuracy and was negative for those conditions.   A maternal serum AFP only should be considered if screening for neural tube defects is desired.  Due to the prior finding of an increased nuchal translucency, we reviewed the option of CVS or amniocentesis, which Ms. Dillavou declines.  She will return for detailed anatomy ultrasound and is aware of the recommendation of a fetal echocardiogram due to the association with congenital heart defects.  We may be reached at [phone] with any questions or concerns.  Cherly Anderson, MS, CGC

## 2021-06-25 LAB — CBC/D/PLT+RPR+RH+ABO+RUBIGG...
Antibody Screen: NEGATIVE
Basophils Absolute: 0 10*3/uL (ref 0.0–0.2)
Basos: 0 %
EOS (ABSOLUTE): 0.1 10*3/uL (ref 0.0–0.4)
Eos: 1 %
HCV Ab: <0.1 s/co ratio (ref 0.0–0.9)
HIV Screen 4th Generation wRfx: NONREACTIVE
Hematocrit: 34.9 % (ref 34.0–46.6)
Hemoglobin: 11.1 g/dL (ref 11.1–15.9)
Hepatitis B Surface Ag: NEGATIVE
Immature Grans (Abs): 0.1 10*3/uL (ref 0.0–0.1)
Immature Granulocytes: 1 %
Lymphocytes Absolute: 1.5 10*3/uL (ref 0.7–3.1)
Lymphs: 15 %
MCH: 25.9 pg — ABNORMAL LOW (ref 26.6–33.0)
MCHC: 31.8 g/dL (ref 31.5–35.7)
MCV: 81 fL (ref 79–97)
Monocytes Absolute: 0.6 10*3/uL (ref 0.1–0.9)
Monocytes: 6 %
Neutrophils Absolute: 7.9 10*3/uL — ABNORMAL HIGH (ref 1.4–7.0)
Neutrophils: 77 %
Platelets: 165 10*3/uL (ref 150–450)
RBC: 4.29 x10E6/uL (ref 3.77–5.28)
RDW: 21.9 % — ABNORMAL HIGH (ref 11.7–15.4)
RPR Ser Ql: NONREACTIVE
Rh Factor: POSITIVE
Rubella Antibodies, IGG: 1.42 index (ref >0.99)
WBC: 10.1 10*3/uL (ref 3.4–10.8)

## 2021-06-25 LAB — PROTEIN / CREATININE RATIO, URINE
Creatinine, Urine: 184.3 mg/dL
Protein, Ur: 32 mg/dL
Protein/Creat Ratio: 174 mg/g creat (ref 0–200)

## 2021-06-25 LAB — COMPREHENSIVE METABOLIC PANEL
ALT: 13 IU/L (ref 0–32)
AST: 18 IU/L (ref 0–40)
Albumin/Globulin Ratio: 1.4 (ref 1.2–2.2)
Albumin: 3.7 g/dL — ABNORMAL LOW (ref 3.9–5.0)
Alkaline Phosphatase: 51 IU/L (ref 44–121)
BUN/Creatinine Ratio: 16 (ref 9–23)
BUN: 8 mg/dL (ref 6–20)
Bilirubin Total: 0.3 mg/dL (ref 0.0–1.2)
CO2: 18 mmol/L — ABNORMAL LOW (ref 20–29)
Calcium: 8.8 mg/dL (ref 8.7–10.2)
Chloride: 104 mmol/L (ref 96–106)
Creatinine, Ser: 0.49 mg/dL — ABNORMAL LOW (ref 0.57–1.00)
Globulin, Total: 2.6 g/dL (ref 1.5–4.5)
Glucose: 70 mg/dL (ref 65–99)
Potassium: 3.8 mmol/L (ref 3.5–5.2)
Sodium: 136 mmol/L (ref 134–144)
Total Protein: 6.3 g/dL (ref 6.0–8.5)
eGFR: 131 mL/min/{1.73_m2} (ref >59)

## 2021-06-25 LAB — HGB FRACTIONATION CASCADE
Hgb A2: 2.6 % (ref 1.8–3.2)
Hgb A: 97.4 % (ref 96.4–98.8)
Hgb F: 0 % (ref 0.0–2.0)
Hgb S: 0 %

## 2021-06-25 LAB — URINE CULTURE: Organism ID, Bacteria: NO GROWTH

## 2021-06-25 LAB — HCV INTERPRETATION

## 2021-07-11 ENCOUNTER — Other Ambulatory Visit: Payer: Self-pay

## 2021-07-11 ENCOUNTER — Ambulatory Visit: Payer: Medicaid Other | Attending: Pediatrics

## 2021-07-11 ENCOUNTER — Ambulatory Visit: Payer: Medicaid Other | Admitting: *Deleted

## 2021-07-11 ENCOUNTER — Other Ambulatory Visit: Payer: Self-pay | Admitting: Obstetrics

## 2021-07-11 VITALS — BP 115/72 | HR 82

## 2021-07-11 DIAGNOSIS — Z363 Encounter for antenatal screening for malformations: Secondary | ICD-10-CM | POA: Diagnosis not present

## 2021-07-11 DIAGNOSIS — O4592 Premature separation of placenta, unspecified, second trimester: Secondary | ICD-10-CM

## 2021-07-11 DIAGNOSIS — O418X10 Other specified disorders of amniotic fluid and membranes, first trimester, not applicable or unspecified: Secondary | ICD-10-CM | POA: Diagnosis present

## 2021-07-11 DIAGNOSIS — O468X1 Other antepartum hemorrhage, first trimester: Secondary | ICD-10-CM | POA: Insufficient documentation

## 2021-07-11 DIAGNOSIS — O289 Unspecified abnormal findings on antenatal screening of mother: Secondary | ICD-10-CM | POA: Diagnosis not present

## 2021-07-11 DIAGNOSIS — Z8759 Personal history of other complications of pregnancy, childbirth and the puerperium: Secondary | ICD-10-CM

## 2021-07-11 DIAGNOSIS — O283 Abnormal ultrasonic finding on antenatal screening of mother: Secondary | ICD-10-CM

## 2021-07-11 DIAGNOSIS — Z3A16 16 weeks gestation of pregnancy: Secondary | ICD-10-CM | POA: Diagnosis not present

## 2021-07-20 ENCOUNTER — Encounter: Payer: Medicaid Other | Admitting: Women's Health

## 2021-08-01 ENCOUNTER — Other Ambulatory Visit: Payer: Self-pay

## 2021-08-01 ENCOUNTER — Encounter: Payer: Self-pay | Admitting: *Deleted

## 2021-08-01 ENCOUNTER — Ambulatory Visit (HOSPITAL_BASED_OUTPATIENT_CLINIC_OR_DEPARTMENT_OTHER): Payer: Medicaid Other | Admitting: Obstetrics

## 2021-08-01 ENCOUNTER — Other Ambulatory Visit: Payer: Self-pay | Admitting: Obstetrics

## 2021-08-01 ENCOUNTER — Telehealth: Payer: Self-pay | Admitting: Women's Health

## 2021-08-01 ENCOUNTER — Ambulatory Visit: Payer: Medicaid Other | Admitting: *Deleted

## 2021-08-01 ENCOUNTER — Ambulatory Visit: Payer: Medicaid Other | Attending: Family Medicine

## 2021-08-01 VITALS — BP 117/64 | HR 62

## 2021-08-01 DIAGNOSIS — O021 Missed abortion: Secondary | ICD-10-CM | POA: Insufficient documentation

## 2021-08-01 DIAGNOSIS — O283 Abnormal ultrasonic finding on antenatal screening of mother: Secondary | ICD-10-CM

## 2021-08-01 DIAGNOSIS — O209 Hemorrhage in early pregnancy, unspecified: Secondary | ICD-10-CM

## 2021-08-01 DIAGNOSIS — O4102X0 Oligohydramnios, second trimester, not applicable or unspecified: Secondary | ICD-10-CM | POA: Insufficient documentation

## 2021-08-01 DIAGNOSIS — O4100X0 Oligohydramnios, unspecified trimester, not applicable or unspecified: Secondary | ICD-10-CM | POA: Diagnosis present

## 2021-08-01 DIAGNOSIS — O364XX0 Maternal care for intrauterine death, not applicable or unspecified: Secondary | ICD-10-CM | POA: Diagnosis not present

## 2021-08-01 DIAGNOSIS — Z3A19 19 weeks gestation of pregnancy: Secondary | ICD-10-CM

## 2021-08-01 NOTE — Progress Notes (Signed)
MFM Note  Debra [name] was seen for an ultrasound exam as an increased nuchal translucency was noted earlier in her pregnancy.  She subsequently had a cell free DNA test (Materni T21) that indicated a low risk for trisomy 21, 18, and 13.  A female fetus is predicted.  The patient reports that she passed two blood clots earlier this morning.  She denies feeling any lower abdominal cramping.  On today's exam, a fetal demise is noted.  Anhydramnios is also noted.  The fetal biometry measurements obtained today are consistent with 17+ weeks, indicating that the demise most likely occurred over a week ago.  The patient reports that she has been experiencing vaginal discharge for the past week, but has not felt a large gush of fluid.  The patient was advised that either rupture of membranes or placental dysfunction could be the cause of today's ultrasound findings.  She was reassured that there was nothing that she nor anyone else did that caused the demise.  She was also reassured that she will most likely have a successful pregnancy outcome during her future pregnancies.   Management options for a fetal demise including a D&E versus an induction of labor were discussed.  The risks versus benefits of each procedure was discussed.  The patient stated that she is leaning towards an induction of labor at this time.  I advised the patient that an induction of labor will be scheduled for her soon, once I discussed her case with you.  The products of conception should be sent for the [facility] miscarriage test available through the lab [facility] or the Reveal miscarriage test that is available through [facility].  These MicroArray tests may provide more information regarding any genetic abnormalities in the fetus that may have contributed to the fetal demise.  The patient stated that all of her questions have been answered today.  A total of 20 minutes was spent counseling and coordinating the care for this  patient.  Greater than 50% of the time was spent in direct face-to-face contact.

## 2021-08-01 NOTE — Progress Notes (Signed)
Patient passed @ 3cm clot and @ 5cm clot this am. U/S tech and MFM MD notified. Patient advised to notify OB MD also.

## 2021-08-01 NOTE — Telephone Encounter (Signed)
Received a call from Dr. Parke Poisson, MFM that pt was seen there today and has IUFD at 19.0wks, measuring 17.5wks, anhydramnios. He asked me to call pt/get set up to go to hospital for induction. Called pt, wants to go on tomorrow. Called Dr. Alysia Penna, 2nd attending- advised I call [facility]. Talked w/ Marquis Buggy, charge RN-states to have pt come b/w 0800-0900 tomorrow and they will call 2nd attending when she arrives. Notified pt.  Cheral Marker, CNM, WHNP-BC 08/01/2021 1:28 PM

## 2021-08-02 ENCOUNTER — Other Ambulatory Visit: Payer: Self-pay

## 2021-08-02 ENCOUNTER — Encounter (HOSPITAL_COMMUNITY): Payer: Self-pay | Admitting: Obstetrics and Gynecology

## 2021-08-02 ENCOUNTER — Observation Stay (HOSPITAL_COMMUNITY)
Admission: AD | Admit: 2021-08-02 | Discharge: 2021-08-02 | Disposition: A | Discharge status: Home / Self Care | Payer: Medicaid Other | Attending: Obstetrics and Gynecology | Admitting: Obstetrics and Gynecology

## 2021-08-02 DIAGNOSIS — O43892 Other placental disorders, second trimester: Secondary | ICD-10-CM | POA: Diagnosis not present

## 2021-08-02 DIAGNOSIS — O021 Missed abortion: Principal | ICD-10-CM

## 2021-08-02 DIAGNOSIS — O43812 Placental infarction, second trimester: Secondary | ICD-10-CM | POA: Diagnosis not present

## 2021-08-02 DIAGNOSIS — Z3A19 19 weeks gestation of pregnancy: Secondary | ICD-10-CM | POA: Diagnosis not present

## 2021-08-02 DIAGNOSIS — O135 Gestational [pregnancy-induced] hypertension without significant proteinuria, complicating the puerperium: Secondary | ICD-10-CM | POA: Insufficient documentation

## 2021-08-02 LAB — CBC
HCT: 34.8 % — ABNORMAL LOW (ref 36.0–46.0)
Hemoglobin: 11.4 g/dL — ABNORMAL LOW (ref 12.0–15.0)
MCH: 27.8 pg (ref 26.0–34.0)
MCHC: 32.8 g/dL (ref 30.0–36.0)
MCV: 84.9 fL (ref 80.0–100.0)
Platelets: 165 10*3/uL (ref 150–400)
RBC: 4.1 MIL/uL (ref 3.87–5.11)
RDW: 15.1 % (ref 11.5–15.5)
WBC: 14.5 10*3/uL — ABNORMAL HIGH (ref 4.0–10.5)
nRBC: 0 % (ref 0.0–0.2)

## 2021-08-02 LAB — COMPREHENSIVE METABOLIC PANEL
ALT: 17 U/L (ref 0–44)
AST: 30 U/L (ref 15–41)
Albumin: 2.9 g/dL — ABNORMAL LOW (ref 3.5–5.0)
Alkaline Phosphatase: 65 U/L (ref 38–126)
Anion gap: 12 (ref 5–15)
BUN: 6 mg/dL (ref 6–20)
CO2: 20 mmol/L — ABNORMAL LOW (ref 22–32)
Calcium: 8.7 mg/dL — ABNORMAL LOW (ref 8.9–10.3)
Chloride: 103 mmol/L (ref 98–111)
Creatinine, Ser: 0.49 mg/dL (ref 0.44–1.00)
GFR, Estimated: >60 mL/min (ref >60)
Glucose, Bld: 107 mg/dL — ABNORMAL HIGH (ref 70–99)
Potassium: 3.7 mmol/L (ref 3.5–5.1)
Sodium: 135 mmol/L (ref 135–145)
Total Bilirubin: 0.7 mg/dL (ref 0.3–1.2)
Total Protein: 6.4 g/dL — ABNORMAL LOW (ref 6.5–8.1)

## 2021-08-02 LAB — TYPE AND SCREEN
ABO/RH(D): O POS
Antibody Screen: NEGATIVE

## 2021-08-02 MED ORDER — ACETAMINOPHEN 325 MG PO TABS
650.0000 mg | ORAL_TABLET | ORAL | Status: DC | PRN
  Administered 2021-08-02: 650 mg via ORAL
  Filled 2021-08-02: qty 2

## 2021-08-02 MED ORDER — CALCIUM CARBONATE ANTACID 500 MG PO CHEW: 2.0000 | CHEWABLE_TABLET | ORAL | Status: DC | PRN

## 2021-08-02 MED ORDER — IBUPROFEN 600 MG PO TABS: 600.0000 mg | ORAL_TABLET | Freq: Four times a day (QID) | ORAL | 1 refills | Status: DC | PRN

## 2021-08-02 MED ORDER — SIMETHICONE 80 MG PO CHEW: 80.0000 mg | CHEWABLE_TABLET | ORAL | Status: DC | PRN

## 2021-08-02 MED ORDER — FENTANYL CITRATE (PF) 100 MCG/2ML IJ SOLN: 50.0000 ug | INTRAMUSCULAR | Status: DC | PRN

## 2021-08-02 MED ORDER — MISOPROSTOL 200 MCG PO TABS
800.0000 ug | ORAL_TABLET | Freq: Once | ORAL | Status: AC
Start: 2021-08-02 — End: 2021-08-02
  Administered 2021-08-02: 800 ug via VAGINAL
  Filled 2021-08-02: qty 4

## 2021-08-02 MED ORDER — IBUPROFEN 600 MG PO TABS: 600.0000 mg | ORAL_TABLET | Freq: Four times a day (QID) | ORAL | Status: DC

## 2021-08-02 MED ORDER — MISOPROSTOL 200 MCG PO TABS: 400.0000 ug | ORAL_TABLET | ORAL | Status: DC

## 2021-08-02 MED ORDER — ZOLPIDEM TARTRATE 5 MG PO TABS: 5.0000 mg | ORAL_TABLET | Freq: Every evening | ORAL | Status: DC | PRN

## 2021-08-02 MED ORDER — ACETAMINOPHEN 325 MG PO TABS: 650.0000 mg | ORAL_TABLET | ORAL | Status: DC | PRN

## 2021-08-02 MED ORDER — OXYTOCIN-SODIUM CHLORIDE 30-0.9 UT/500ML-% IV SOLN
INTRAVENOUS | Status: AC
  Filled 2021-08-02: qty 500

## 2021-08-02 MED ORDER — PRENATAL MULTIVITAMIN CH: 1.0000 | ORAL_TABLET | Freq: Every day | ORAL | Status: DC

## 2021-08-02 MED ORDER — MISOPROSTOL 200 MCG PO TABS
400.0000 ug | ORAL_TABLET | ORAL | Status: DC
  Filled 2021-08-02: qty 2

## 2021-08-02 MED ORDER — OXYTOCIN-SODIUM CHLORIDE 30-0.9 UT/500ML-% IV SOLN
2.5000 [IU]/h | INTRAVENOUS | Status: DC | PRN
  Administered 2021-08-02: 2.5 [IU]/h via INTRAVENOUS

## 2021-08-02 MED ORDER — LACTATED RINGERS IV SOLN: INTRAVENOUS | Status: DC

## 2021-08-02 MED ORDER — DOCUSATE SODIUM 100 MG PO CAPS: 100.0000 mg | ORAL_CAPSULE | Freq: Every day | ORAL | Status: DC

## 2021-08-02 NOTE — Progress Notes (Signed)
Patient ID: Debra [name], female   DOB: [birth year], 30 y.o.   MRN: [id number] Delivery of non-viable fetus at about 1700, with spontaneous passage of the placenta at 1800. Anora specimen was obtained for analysis. Patient is doing well and receiving IV pitocin.  Adam Phenix, MD 08/02/2021 6:11 PM

## 2021-08-02 NOTE — H&P (Signed)
Debra [name] is a 30 y.o. female presenting for IOL at 11 1/7 weeks due to fetal demise. Dx noted yesterday with MFM U/S. H/O thicken NT and possible fetal hydrops on U/S. Genetic testing negative thus far.  She has noted some vaginal spotting over the last few days. No LOF.   Pt had a positive home Covid test 8/20. She is currently aSx. OB History     Gravida  3   Para  1   Term  1   Preterm      AB  1   Living  1      SAB      IAB  1   Ectopic      Multiple  0   Live Births  1          Past Medical History:  Diagnosis Date   Contraceptive management 11/27/2014   Postpartum hypertension 2018   Past Surgical History:  Procedure Laterality Date   HERNIA REPAIR Bilateral 1998   WISDOM TOOTH EXTRACTION     Family History: family history includes Asthma in her brother; COPD in her paternal grandmother; Cancer in her paternal grandfather and paternal grandmother; Healthy in her father and mother; Heart attack in her maternal uncle; Other in her maternal grandfather and maternal grandmother. Social History:  reports that she has been smoking cigarettes. She has a 4.00 pack-year smoking history. She has never used smokeless tobacco. She reports that she does not drink alcohol and does not use drugs.     Maternal Diabetes: No Genetic Screening: Normal Maternal Ultrasounds/Referrals: Other: Fetal Ultrasounds or other Referrals:  Referred to Materal Fetal Medicine  Maternal Substance Abuse:  No Significant Maternal Medications:  None Significant Maternal Lab Results:  None Other Comments:  None  Review of Systems  Constitutional: Negative.   Respiratory: Negative.    Cardiovascular: Negative.   Gastrointestinal: Negative.   Genitourinary:  Positive for vaginal bleeding.  History   Blood pressure 109/68, pulse 75, temperature 98.1 F (36.7 C), temperature source Oral, resp. rate 16, height 5\' 9"  (1.753 m), weight 61.2 kg, SpO2 100 %. Exam Physical  Exam Constitutional:      Appearance: Normal appearance.  Cardiovascular:     Rate and Rhythm: Normal rate and regular rhythm.  Pulmonary:     Effort: Pulmonary effort is normal.     Breath sounds: Normal breath sounds.  Abdominal:     General: Bowel sounds are normal.     Palpations: Abdomen is soft.     Comments: FH @ 18  Genitourinary:    Comments: Deffered Neurological:     Mental Status: She is alert.    Prenatal labs: ABO, Rh: O/Positive/-- (08/03 1134) Antibody: Negative (08/03 1134) Rubella: 1.42 (08/03 1134) RPR: Non Reactive (08/03 1134)  HBsAg: Negative (08/03 1134)  HIV: Non Reactive (08/03 1134)  GBS:     Assessment/Plan: FDIU at 19 1/7 weeks H/O Covid + 8/20, currently Asx   IOL via Cytotec reviewed with pt and family. Pain management dicussed. Potential for D & C discussed as well.  Pt desires Anora genetic testing.  9/20 08/02/2021, 11:23 AM

## 2021-08-02 NOTE — Progress Notes (Signed)
RN educated patient on discharge instructions including postpartum care and signs of postpartum depression. Went over next visit and order for ibuprofen PRN. Patient discharged home with significant other. Raelyn Ensign, RN

## 2021-08-02 NOTE — Discharge Summary (Signed)
Postpartum Discharge Summary  Date of Service updated 08/02/2021      Patient Name: Debra [name] DOB: [birth year] MRN: [id number]  Date of admission: 08/02/2021 Delivery date:08/02/2021 Delivering provider: 08/02/2021 Date of discharge: 08/02/2021  Admitting diagnosis: IUFD at less than 20 weeks of gestation [O02.1] Intrauterine pregnancy: [redacted]w[redacted]d     Secondary diagnosis:  Active Problems:   IUFD at less than 20 weeks of gestation  Additional problems: none    Discharge diagnosis:  Spontaneous abortion, IUFD at 24 weeks                                               Post partum procedures: none Augmentation: Pitocin and Cytotec Complications: None  Hospital course: Induction of Labor With Vaginal Delivery   30 y.o. yo G3P1011 at [redacted]w[redacted]d was admitted to the hospital 08/02/2021 for management of fetal demise at 19 weeks   Dx noted yesterday with MFM U/S. H/O thicken NT and possible fetal hydrops on U/S. Genetic testing negative thus far  IUFD .  Patient had an uncomplicated labor course as follows: She received Cytotec to induce termination with delivery of the fetus and placenta without complication. Details of delivery can be found in separate delivery note.  Patient had a routine postpartum course. Patient is discharged home 08/02/21.  Newborn Data: Birth date: 08/02/2021 Birth time:08/02/2021 Gender:This patient has no babies on file. Living status:Fetal demise Weight:not recorded  Magnesium Sulfate received: No BMZ received: No Rhophylac:No MMR:No T-DaP:Given prenatally Flu: No Transfusion:No  Physical exam  Vitals:   08/02/21 1550 08/02/21 1719 08/02/21 1736 08/02/21 1751  BP: (!) 118/47 119/61 101/61 (!) 111/59  Pulse: 81 85 86 76  Resp: $Remo'18 18 18 18  'gHHeS$ Temp: 98.3 F (36.8 C) 98.3 F (36.8 C)    TempSrc: Oral Oral    SpO2: 100% 100% 99%   Weight:      Height:       General: alert, cooperative, and no distress Lochia: appropriate Uterine Fundus:  firm Incision: N/A DVT Evaluation: No evidence of DVT seen on physical exam. Labs: Lab Results  Component Value Date   WBC 14.5 (H) 08/02/2021   HGB 11.4 (L) 08/02/2021   HCT 34.8 (L) 08/02/2021   MCV 84.9 08/02/2021   PLT 165 08/02/2021   CMP Latest Ref Rng & Units 08/02/2021  Glucose 70 - 99 mg/dL 107(H)  BUN 6 - 20 mg/dL 6  Creatinine 0.44 - 1.00 mg/dL 0.49  Sodium 135 - 145 mmol/L 135  Potassium 3.5 - 5.1 mmol/L 3.7  Chloride 98 - 111 mmol/L 103  CO2 22 - 32 mmol/L 20(L)  Calcium 8.9 - 10.3 mg/dL 8.7(L)  Total Protein 6.5 - 8.1 g/dL 6.4(L)  Total Bilirubin 0.3 - 1.2 mg/dL 0.7  Alkaline Phos 38 - 126 U/L 65  AST 15 - 41 U/L 30  ALT 0 - 44 U/L 17   Edinburgh Score: Edinburgh Postnatal Depression Scale Screening Tool 12/26/2017  I have been able to laugh and see the funny side of things. 0  I have looked forward with enjoyment to things. 0  I have blamed myself unnecessarily when things went wrong. 0  I have been anxious or worried for no good reason. 0  I have felt scared or panicky for no good reason. 0  Things have been getting on top of me. 0  I  have been so unhappy that I have had difficulty sleeping. 0  I have felt sad or miserable. 0  I have been so unhappy that I have been crying. 0  The thought of harming myself has occurred to me. 0  Edinburgh Postnatal Depression Scale Total 0     After visit meds:  Allergies as of 08/02/2021       Reactions   Bee Venom Anaphylaxis, Hives        Medication List     TAKE these medications    aspirin 81 MG EC tablet Take 2 tablets (162 mg total) by mouth daily. Swallow whole.   Blood Pressure Monitor Misc For regular home bp monitoring during pregnancy   ferrous sulfate 325 (65 FE) MG tablet Take 1 tablet (325 mg total) by mouth every other day.   ibuprofen 600 MG tablet Commonly known as: ADVIL Take 1 tablet (600 mg total) by mouth every 6 (six) hours as needed.   PRENATAL VITAMIN PO Take by mouth.          Discharge home in stable condition Infant Disposition: pathology Discharge instruction: per After Visit Summary and Postpartum booklet. Activity: Advance as tolerated. Pelvic rest for 6 weeks.  Diet: routine diet Future Appointments: Future Appointments  Date Time Provider [place]  08/22/2021  4:10 PM Roma Schanz, CNM [facility]   Follow up Visit:  Follow-up Information     [facility] Follow up in 2 week(s).   Specialty: Obstetrics and Gynecology Contact information: [address] [place] [phone]                 Please schedule this patient for a In person postpartum visit in  3 weeks  with the following provider: APP. Additional Postpartum F/U:Postpartum Depression checkup  High risk pregnancy complicated by:  fetal demise Delivery mode:  Spontaneous vaginal Anticipated Birth Control:  Unsure   08/02/2021 Emeterio Reeve, MD

## 2021-08-03 ENCOUNTER — Telehealth: Payer: Self-pay | Admitting: Obstetrics & Gynecology

## 2021-08-03 NOTE — Telephone Encounter (Signed)
This encounter was created in error - please disregard.

## 2021-08-03 NOTE — Telephone Encounter (Signed)
Returned pt's call. Two identifiers used. Pt stated that she had passed a few small blood clots, but was concerned about a couple of tissue-like pieces that she had seen along with the clots. Pt stated that her bleeding was almost stopped and that her cramping was much improved. Pt was instructed to empty her bladder frequently. Pt asked if she was able to send a picture on Mychart. Pt is going to send a picture. Will discuss with provider and call pt back. Pt confirmed understanding.

## 2021-08-03 NOTE — Telephone Encounter (Signed)
Patient called stating that [facility], patient states that the provider that did the procedure felt confident that he got all of the placenta out but patient states that she went to the rest room and she passed a blood clot that was bigger than a quarter. Patient would like a call back from a nurse to see what she needs to do. Please contact pt as soon as possible

## 2021-08-05 LAB — SURGICAL PATHOLOGY

## 2021-08-08 ENCOUNTER — Encounter: Payer: Medicaid Other | Admitting: Women's Health

## 2021-08-22 ENCOUNTER — Encounter: Payer: Self-pay | Admitting: Women's Health

## 2021-08-22 ENCOUNTER — Other Ambulatory Visit: Payer: Self-pay

## 2021-08-22 ENCOUNTER — Ambulatory Visit (INDEPENDENT_AMBULATORY_CARE_PROVIDER_SITE_OTHER): Payer: Self-pay | Admitting: Women's Health

## 2021-08-22 VITALS — BP 111/72 | HR 71 | Ht 69.0 in | Wt 135.0 lb

## 2021-08-22 DIAGNOSIS — Z5189 Encounter for other specified aftercare: Secondary | ICD-10-CM

## 2021-08-22 DIAGNOSIS — D649 Anemia, unspecified: Secondary | ICD-10-CM

## 2021-08-22 DIAGNOSIS — O039 Complete or unspecified spontaneous abortion without complication: Secondary | ICD-10-CM

## 2021-08-22 LAB — POCT HEMOGLOBIN: Hemoglobin: 10.2 g/dL — AB (ref 11–14.6)

## 2021-08-22 NOTE — Progress Notes (Signed)
GYN VISIT Patient name: Debra [name] MRN [id number]  Date of birth: 06/17/1991 Chief Complaint:   Follow-up  History of Present Illness:   Debra [name] is a 30 y.o. G42P1011 Caucasian female 3wks s/p vaginal delivery of 19.1wk IUFD noted on MFM u/s the same day. IOL w/ cytotec. Had thickened NT noted @ 13wks on u/s during [facility] visit, MaterniT21 showed normal female. Normal u/s w/ MFM at 16wks, then on 9/12 @ 19wks no FCA and anhydramnios w/ 17.5wk fetal size. 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 Anora showed normal female.  Pt is doing well, bleeding has stopped. Has gone back to work, has a great support group, is trying to stay busy to take her mind off losing this baby. Does want to try again asap for another pregnancy. States it took her 3 years to conceive.   No LMP recorded (lmp unknown). The current method of family planning is none.  Last pap 08/17/20. Results were: NILM w/ HRHPV negative  Depression screen [facility] 2/9 06/22/2021 08/17/2020 08/17/2020 10/23/2017 05/02/2017  Decreased Interest 0 0 0 0 0  Down, Depressed, Hopeless 0 0 0 - 0  PHQ - 2 Score 0 0 0 0 0  Altered sleeping 2 1 - - 0  Tired, decreased energy 1 0 - - 1  Change in appetite 0 0 - - 0  Feeling bad or failure about yourself  0 0 - - 0  Trouble concentrating 0 0 - - 0  Moving slowly or fidgety/restless 0 0 - - 0  Suicidal thoughts 0 0 - - 0  PHQ-9 Score 3 1 - - 1     GAD 7 : Generalized Anxiety Score 06/22/2021 08/17/2020  Nervous, Anxious, on Edge 1 0  Control/stop worrying 0 0  Worry too much - different things 1 0  Trouble relaxing 1 1  Restless 0 0  Easily annoyed or irritable 0 0  Afraid - awful might happen 0 0  Total GAD 7 Score 3 1     Review of Systems:   Pertinent items are noted in HPI Denies fever/chills, dizziness, headaches, visual disturbances, fatigue, shortness of breath, chest pain, abdominal pain, vomiting, abnormal vaginal  discharge/itching/odor/irritation, problems with periods, bowel movements, urination, or intercourse unless otherwise stated above.  Pertinent History Reviewed:  Reviewed past medical,surgical, social, obstetrical and family history.  Reviewed problem list, medications and allergies. Physical Assessment:   Vitals:   08/22/21 1633  BP: 111/72  Pulse: 71  Weight: 135 lb (61.2 kg)  Height: 5\' 9"  (1.753 m)  Body mass index is 19.94 kg/m.       Physical Examination:   General appearance: alert, well appearing, and in no distress  Mental status: alert, oriented to person, place, and time  Skin: warm & dry   Cardiovascular: normal heart rate noted  Respiratory: normal respiratory effort, no distress  Abdomen: soft, non-tender   Pelvic: examination not indicated  Extremities: no edema   Chaperone: N/A    No results found for this or any previous visit (from the past 24 hour(s)).  Assessment & Plan:  1) 3wks s/p 19wk IUFD> had thickened NT, normal NIPS, then anhydramnios and no FCA @ 19wks. Normal Anora. 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 Per LHE, no further workup or anything different for future pregnancies needed right now.  Doing well, good support at home, has access to support groups. Wants to try to conceive again asap. Resume pnv. Was taking Fe for anemia.  Fingerstick hgb today 10.2, can resume Fe QOD. Let us know when gets +PT, or w/o +PT (took 18yrs to conceive this time).   Meds: No orders of the defined types were placed in this encounter.   No orders of the defined types were placed in this encounter.   Return in about 1 year (around 08/22/2022) for Physical.  Cheral Marker CNM, WHNP-BC 08/22/2021 5:00 PM

## 2021-08-22 NOTE — Patient Instructions (Signed)
[email or web address]

## 2021-09-12 ENCOUNTER — Other Ambulatory Visit: Payer: Self-pay | Admitting: Women's Health

## 2021-09-12 MED ORDER — TRIAMCINOLONE ACETONIDE 0.1 % EX OINT: 1.0000 "application " | TOPICAL_OINTMENT | Freq: Two times a day (BID) | CUTANEOUS | 0 refills | Status: DC

## 2022-04-12 ENCOUNTER — Encounter: Payer: Self-pay | Admitting: Nurse Practitioner

## 2022-04-12 ENCOUNTER — Ambulatory Visit (INDEPENDENT_AMBULATORY_CARE_PROVIDER_SITE_OTHER): Payer: Medicaid Other | Admitting: Nurse Practitioner

## 2022-04-12 VITALS — BP 114/77 | HR 85 | Temp 98.1°F | Ht 69.0 in | Wt 132.2 lb

## 2022-04-12 DIAGNOSIS — J020 Streptococcal pharyngitis: Secondary | ICD-10-CM | POA: Diagnosis not present

## 2022-04-12 DIAGNOSIS — D649 Anemia, unspecified: Secondary | ICD-10-CM

## 2022-04-12 DIAGNOSIS — J029 Acute pharyngitis, unspecified: Secondary | ICD-10-CM | POA: Diagnosis not present

## 2022-04-12 DIAGNOSIS — R591 Generalized enlarged lymph nodes: Secondary | ICD-10-CM

## 2022-04-12 LAB — RAPID STREP SCREEN (MED CTR MEBANE ONLY): Strep Gp A Ag, IA W/Reflex: POSITIVE — AB

## 2022-04-12 MED ORDER — IBUPROFEN 600 MG PO TABS: 600.0000 mg | ORAL_TABLET | Freq: Three times a day (TID) | ORAL | 0 refills | Status: AC | PRN

## 2022-04-12 MED ORDER — AMOXICILLIN-POT CLAVULANATE 875-125 MG PO TABS: 1.0000 | ORAL_TABLET | Freq: Two times a day (BID) | ORAL | 0 refills | Status: DC

## 2022-04-12 NOTE — Patient Instructions (Signed)
Lymphadenopathy  Lymphadenopathy means that your lymph glands are swollen or larger than normal. Lymph glands, also called lymph nodes, are collections of tissue that filter excess fluid, bacteria, viruses, and waste from your bloodstream. They are part of your body's disease-fighting system (immune system), which protects your body from germs. There may be different causes of lymphadenopathy, depending on where it is in your body. Some types go away on their own. Lymphadenopathy can occur anywhere that you have lymph glands, including these areas: Neck (cervical lymphadenopathy). Chest (mediastinal lymphadenopathy). Lungs (hilar lymphadenopathy). Underarms (axillary lymphadenopathy). Groin (inguinal lymphadenopathy). When your immune system responds to germs, infection-fighting cells and fluid build up in your lymph glands. This causes some swelling and enlargement. If the lymph nodes do not go back to normal size after you have an infection or disease, your health care provider may do tests. These tests help to monitor your condition and find the reason why the glands are still swollen and enlarged. Follow these instructions at home:  Get plenty of rest. Your health care provider may recommend over-the-counter medicines for pain. Take over-the-counter and prescription medicines only as told by your health care provider. If directed, apply heat to swollen lymph glands as often as told by your health care provider. Use the heat source that your health care provider recommends, such as a moist heat pack or a heating pad. Place a towel between your skin and the heat source. Leave the heat on for 20-30 minutes. Remove the heat if your skin turns bright red. This is especially important if you are unable to feel pain, heat, or cold. You may have a greater risk of getting burned. Check your affected lymph glands every day for changes. Check other lymph gland areas as told by your health care provider.  Check for changes such as: More swelling. Sudden increase in size. Redness or pain. Hardness. Keep all follow-up visits. This is important. Contact a health care provider if you have: Lymph glands that: Are still swollen after 2 weeks. Have suddenly gotten bigger or the swelling spreads. Are red, painful, or hard. Fluid leaking from the skin near an enlarged lymph gland. Problems with breathing. A fever, chills, or night sweats. Fatigue. A sore throat. Pain in your abdomen. Weight loss. Get help right away if you have: Severe pain. Chest pain. Shortness of breath. These symptoms may represent a serious problem that is an emergency. Do not wait to see if the symptoms will go away. Get medical help right away. Call your local emergency services (911 in the [place] Do not drive yourself to the hospital. Summary Lymphadenopathy means that your lymph glands are swollen or larger than normal. Lymph glands, also called lymph nodes, are collections of tissue that filter excess fluid, bacteria, viruses, and waste from the bloodstream. They are part of your body's disease-fighting system (immune system). Lymphadenopathy can occur anywhere that you have lymph glands. If the lymph nodes do not go back to normal size after you have an infection or disease, your health care provider may do tests to monitor your condition and find the reason why the glands are still swollen and enlarged. Check your affected lymph glands every day for changes. Check other lymph gland areas as told by your health care provider. This information is not intended to replace advice given to you by your health care provider. Make sure you discuss any questions you have with your health care provider. Document Revised: 09/01/2020 Document Reviewed: 09/01/2020 Elsevier Patient Education    2023 [employer] Inc.  

## 2022-04-12 NOTE — Progress Notes (Signed)
Acute Office Visit  Subjective:     Patient ID: Debra [name], female    DOB: 03/26/1991, 31 y.o.   MRN: [id number]  Chief Complaint  Patient presents with   Generalized Body Aches    Since Monday morning   Torticollis    Started 1 day ago - right side   Sore Throat    Yawning - swallowing - hurts      Sore Throat  This is a new problem. The current episode started yesterday. The problem has been gradually worsening. The pain is worse on the right side. There has been no fever. The pain is moderate. Associated symptoms include swollen glands. Pertinent negatives include no congestion, coughing, drooling, ear pain or headaches. She has had no exposure to strep. She has tried acetaminophen for the symptoms. The treatment provided no relief.    Review of Systems  Constitutional: Negative.  Negative for chills, fever and weight loss.  HENT:  Positive for sore throat. Negative for congestion, drooling and ear pain.   Respiratory:  Negative for cough.   Cardiovascular: Negative.   Gastrointestinal: Negative.  Negative for nausea.  Skin: Negative.   Neurological:  Negative for headaches.  All other systems reviewed and are negative.      Objective:    BP 114/77   Pulse 85   Temp 98.1 F (36.7 C)   Ht 5\' 9"  (1.753 m)   Wt 132 lb 3.2 oz (60 kg)   SpO2 100%   BMI 19.52 kg/m    Physical Exam Vitals reviewed.  HENT:     Head: Normocephalic.     Mouth/Throat:     Pharynx: Pharyngeal swelling and posterior oropharyngeal erythema present.  Eyes:     Conjunctiva/sclera: Conjunctivae normal.  Cardiovascular:     Rate and Rhythm: Normal rate and regular rhythm.  Pulmonary:     Effort: Pulmonary effort is normal.     Breath sounds: Normal breath sounds.  Abdominal:     General: Bowel sounds are normal.     Palpations: Abdomen is soft.  Skin:    General: Skin is warm.     Findings: No erythema or rash.  Neurological:     General: No focal deficit present.      Mental Status: She is alert and oriented to person, place, and time.    No results found for any visits on 04/12/22.      Assessment & Plan:  Positive for strep Take meds as prescribed - Use a cool mist humidifier  -Use saline nose sprays frequently -Force fluids -For fever or aches or pains- take Tylenol or ibuprofen. -If symptoms do not improve, she may need to be COVID tested to rule this out Follow up with worsening unresolved symptoms  Problem List Items Addressed This Visit   None Visit Diagnoses     Lymphadenopathy    -  Primary   Relevant Orders   CBC with Differential   Strep pharyngitis       Relevant Medications   ibuprofen (ADVIL) 600 MG tablet   amoxicillin-clavulanate (AUGMENTIN) 875-125 MG tablet   Other Relevant Orders   Rapid Strep Screen ([facility] ONLY)       Meds ordered this encounter  Medications   ibuprofen (ADVIL) 600 MG tablet    Sig: Take 1 tablet (600 mg total) by mouth every 8 (eight) hours as needed.    Dispense:  30 tablet    Refill:  0    Order Specific  Question:   Supervising Provider    Answer:   Mechele Claude [phone]   amoxicillin-clavulanate (AUGMENTIN) 875-125 MG tablet    Sig: Take 1 tablet by mouth 2 (two) times daily.    Dispense:  20 tablet    Refill:  0    Order Specific Question:   Supervising Provider    Answer:   Mechele Claude [[id number]]    Return if symptoms worsen or fail to improve.  Daryll Drown, NP

## 2022-04-13 ENCOUNTER — Other Ambulatory Visit: Payer: Self-pay

## 2022-04-13 ENCOUNTER — Other Ambulatory Visit: Payer: Self-pay | Admitting: Family Medicine

## 2022-04-13 ENCOUNTER — Telehealth: Payer: Self-pay

## 2022-04-13 ENCOUNTER — Other Ambulatory Visit: Payer: Medicaid Other

## 2022-04-13 ENCOUNTER — Encounter: Payer: Self-pay | Admitting: Family Medicine

## 2022-04-13 ENCOUNTER — Encounter: Payer: Self-pay | Admitting: Nurse Practitioner

## 2022-04-13 DIAGNOSIS — D649 Anemia, unspecified: Secondary | ICD-10-CM | POA: Diagnosis not present

## 2022-04-13 DIAGNOSIS — D509 Iron deficiency anemia, unspecified: Secondary | ICD-10-CM

## 2022-04-13 LAB — BMP8+EGFR
BUN/Creatinine Ratio: 17 (ref 9–23)
BUN: 9 mg/dL (ref 6–20)
CO2: 23 mmol/L (ref 20–29)
Calcium: 8.7 mg/dL (ref 8.7–10.2)
Chloride: 102 mmol/L (ref 96–106)
Creatinine, Ser: 0.53 mg/dL — ABNORMAL LOW (ref 0.57–1.00)
Glucose: 93 mg/dL (ref 70–99)
Potassium: 3.9 mmol/L (ref 3.5–5.2)
Sodium: 139 mmol/L (ref 134–144)
eGFR: 128 mL/min/{1.73_m2} (ref >59)

## 2022-04-13 LAB — CBC WITH DIFFERENTIAL/PLATELET
Basophils Absolute: 0 10*3/uL (ref 0.0–0.2)
Basophils Absolute: 0 10*3/uL (ref 0.0–0.2)
Basos: 1 %
Basos: 0 %
EOS (ABSOLUTE): 0.1 10*3/uL (ref 0.0–0.4)
EOS (ABSOLUTE): 0.2 10*3/uL (ref 0.0–0.4)
Eos: 1 %
Eos: 3 %
Hematocrit: 26.9 % — ABNORMAL LOW (ref 34.0–46.6)
Hematocrit: 27.1 % — ABNORMAL LOW (ref 34.0–46.6)
Hemoglobin: 7.8 g/dL — CL (ref 11.1–15.9)
Hemoglobin: 7.5 g/dL — CL (ref 11.1–15.9)
Immature Grans (Abs): 0 10*3/uL (ref 0.0–0.1)
Immature Grans (Abs): 0 10*3/uL (ref 0.0–0.1)
Immature Granulocytes: 1 %
Immature Granulocytes: 1 %
Lymphocytes Absolute: 1.6 10*3/uL (ref 0.7–3.1)
Lymphocytes Absolute: 1.8 10*3/uL (ref 0.7–3.1)
Lymphs: 19 %
Lymphs: 25 %
MCH: 18 pg — ABNORMAL LOW (ref 26.6–33.0)
MCH: 17.1 pg — ABNORMAL LOW (ref 26.6–33.0)
MCHC: 29 g/dL — ABNORMAL LOW (ref 31.5–35.7)
MCHC: 27.7 g/dL — ABNORMAL LOW (ref 31.5–35.7)
MCV: 62 fL — ABNORMAL LOW (ref 79–97)
MCV: 62 fL — ABNORMAL LOW (ref 79–97)
Monocytes Absolute: 0.9 10*3/uL (ref 0.1–0.9)
Monocytes Absolute: 0.7 10*3/uL (ref 0.1–0.9)
Monocytes: 11 %
Monocytes: 10 %
Neutrophils Absolute: 6 10*3/uL (ref 1.4–7.0)
Neutrophils Absolute: 4.3 10*3/uL (ref 1.4–7.0)
Neutrophils: 67 %
Neutrophils: 61 %
Platelets: 217 10*3/uL (ref 150–450)
Platelets: 202 10*3/uL (ref 150–450)
RBC: 4.33 x10E6/uL (ref 3.77–5.28)
RBC: 4.39 x10E6/uL (ref 3.77–5.28)
RDW: 17.6 % — ABNORMAL HIGH (ref 11.7–15.4)
RDW: 17.4 % — ABNORMAL HIGH (ref 11.7–15.4)
WBC: 8.7 10*3/uL (ref 3.4–10.8)
WBC: 7 10*3/uL (ref 3.4–10.8)

## 2022-04-13 LAB — IRON,TIBC AND FERRITIN PANEL
Ferritin: 7 ng/mL — ABNORMAL LOW (ref 15–150)
Iron Saturation: 46 % (ref 15–55)
Iron: 194 ug/dL — ABNORMAL HIGH (ref 27–159)
Total Iron Binding Capacity: 419 ug/dL (ref 250–450)
UIBC: 225 ug/dL (ref 131–425)

## 2022-04-13 MED ORDER — FERROUS SULFATE 324 (65 FE) MG PO TBEC: 324.0000 mg | DELAYED_RELEASE_TABLET | Freq: Two times a day (BID) | ORAL | 3 refills | Status: DC

## 2022-04-13 NOTE — Addendum Note (Signed)
Addended by: Everlean Cherry on: 04/13/2022 10:25 AM   Modules accepted: Orders

## 2022-04-13 NOTE — Telephone Encounter (Signed)
Pt aware of labs and is coming in for stat labs

## 2022-04-14 ENCOUNTER — Telehealth: Payer: Self-pay | Admitting: Family Medicine

## 2022-04-14 NOTE — Telephone Encounter (Signed)
PT AWARE IT IS OK TO TAKE AMOXICILLIN AND IRON AT THE SAME TIME

## 2022-04-14 NOTE — Telephone Encounter (Signed)
PT AWARE  

## 2022-04-20 ENCOUNTER — Other Ambulatory Visit: Payer: Medicaid Other

## 2022-04-20 DIAGNOSIS — D649 Anemia, unspecified: Secondary | ICD-10-CM | POA: Diagnosis not present

## 2022-04-20 DIAGNOSIS — D509 Iron deficiency anemia, unspecified: Secondary | ICD-10-CM | POA: Diagnosis not present

## 2022-04-20 LAB — HEMOGLOBIN, FINGERSTICK: Hemoglobin: 9.2 g/dL — ABNORMAL LOW (ref 11.1–15.9)

## 2022-04-21 LAB — HEPATIC FUNCTION PANEL
ALT: 15 IU/L (ref 0–32)
AST: 21 IU/L (ref 0–40)
Albumin: 4.4 g/dL (ref 3.9–5.0)
Alkaline Phosphatase: 64 IU/L (ref 44–121)
Bilirubin Total: 0.3 mg/dL (ref 0.0–1.2)
Bilirubin, Direct: <0.1 mg/dL (ref 0.00–0.40)
Total Protein: 7.5 g/dL (ref 6.0–8.5)

## 2022-05-01 ENCOUNTER — Telehealth: Payer: Self-pay | Admitting: Family Medicine

## 2022-05-01 ENCOUNTER — Encounter: Payer: Self-pay | Admitting: Family Medicine

## 2022-05-01 ENCOUNTER — Ambulatory Visit (INDEPENDENT_AMBULATORY_CARE_PROVIDER_SITE_OTHER): Payer: Medicaid Other | Admitting: Family Medicine

## 2022-05-01 ENCOUNTER — Other Ambulatory Visit: Payer: Self-pay

## 2022-05-01 VITALS — BP 119/78 | HR 76 | Temp 97.6°F | Ht 69.0 in | Wt 133.0 lb

## 2022-05-01 DIAGNOSIS — Z720 Tobacco use: Secondary | ICD-10-CM

## 2022-05-01 DIAGNOSIS — D649 Anemia, unspecified: Secondary | ICD-10-CM | POA: Diagnosis not present

## 2022-05-01 DIAGNOSIS — D509 Iron deficiency anemia, unspecified: Secondary | ICD-10-CM

## 2022-05-01 DIAGNOSIS — Z319 Encounter for procreative management, unspecified: Secondary | ICD-10-CM | POA: Insufficient documentation

## 2022-05-01 DIAGNOSIS — Z0001 Encounter for general adult medical examination with abnormal findings: Secondary | ICD-10-CM | POA: Diagnosis not present

## 2022-05-01 DIAGNOSIS — Z13228 Encounter for screening for other metabolic disorders: Secondary | ICD-10-CM

## 2022-05-01 DIAGNOSIS — Z0000 Encounter for general adult medical examination without abnormal findings: Secondary | ICD-10-CM

## 2022-05-01 DIAGNOSIS — I8393 Asymptomatic varicose veins of bilateral lower extremities: Secondary | ICD-10-CM

## 2022-05-01 MED ORDER — VARENICLINE TARTRATE 0.5 MG PO TABS: ORAL_TABLET | ORAL | 0 refills | Status: AC

## 2022-05-01 MED ORDER — VARENICLINE TARTRATE 1 MG PO TABS: 1.0000 mg | ORAL_TABLET | Freq: Two times a day (BID) | ORAL | 4 refills | Status: DC

## 2022-05-01 NOTE — Progress Notes (Signed)
Debra [name] is a 31 y.o. female presents to office today for annual physical exam examination.    Concerns today include: 1.  Smoking cessation Patient is very motivated to stop smoking.  She has been considering patches but was not aware of Chantix and after further discussion had decided that she would like to try that for smoking cessation.  She has been a smoker since age 60 but fairly regular smoker since age 89.  Currently smoking 1 pack/day.  No hemoptysis, unplanned weight loss, night sweats, sore throat or change in voice.  Her father was diagnosed and treated for a very significant squamous cell carcinoma of the upper airway.  She notes that he required total facial reconstruction to resect this tumor.  Her paternal grandfather died of lung cancer and her paternal grandmother had COPD.  She knows that this is a bad habit to have and she would really like to stop smoking.  No personal history of seizure disorder.  No depression or anxiety.  She is actively trying to conceive and will be seeing OB/GYN soon to discuss this further as she is 8 months out from her miscarriage and has not had any success thus far.  2.  Anemia Patient reports that her symptoms of fatigue and shortness of breath have improved substantially since being on the iron.  She is taking ferrous sulfate 325 twice daily with vitamin C.  She continues to have very slight shortness of breath but nothing to the degree that she was experiencing before.  Currently on her menstrual cycle.  Occupation: works from home, Marital status: married, Substance use: tobacco Diet: balanced, Exercise: no structured reported Last pap smear: UTD, sees OBGYN Refills needed today: none Immunizations needed: Immunization History  Administered Date(s) Administered   DTaP 11/04/1991, 01/08/1992, 03/08/1992, 08/31/1994, 12/07/1995   HPV Quadrivalent 04/24/2006, 06/25/2006, 10/30/2006   Hepatitis A 08/10/2005, 04/24/2006   Hepatitis B  11/04/1991, 01/08/1992, 08/31/1994   HiB (PRP-OMP) 11/04/1991, 01/08/1992, 03/08/1992, 01/05/1993   IPV 11/04/1991, 01/08/1992, 08/31/1994, 12/07/1995   Influenza,inj,Quad PF,6+ Mos 09/06/2017   Influenza-Unspecified 09/06/2017   MMR 01/05/1993, 12/07/1995   Meningococcal Conjugate 04/11/2007   Meningococcal Polysaccharide 04/19/2010   Tdap 10/23/2017   Varicella 04/19/2010     Past Medical History:  Diagnosis Date   Contraceptive management 11/27/2014   Postpartum hypertension 2018   Social History   Socioeconomic History   Marital status: Single    Spouse name: Not on file   Number of children: 1   Years of education: Not on file   Highest education level: Not on file  Occupational History   Not on file  Tobacco Use   Smoking status: Every Day    Packs/day: 1.00    Years: 14.00    Total pack years: 14.00    Types: Cigarettes   Smokeless tobacco: Never  Vaping Use   Vaping Use: Never used  Substance and Sexual Activity   Alcohol use: No    Comment: occ   Drug use: No   Sexual activity: Yes    Birth control/protection: None  Other Topics Concern   Not on file  Social History Narrative   Married. Has 1 living child that is 30 years old.    Works from home   Smokes 1 ppd since age 56.   Social Determinants of Health   Financial Resource Strain: Low Risk  (08/17/2020)   Overall Financial Resource Strain (CARDIA)    Difficulty of Paying Living Expenses: Not hard at all  Food Insecurity: No Food Insecurity (08/17/2020)   Hunger Vital Sign    Worried About Running Out of Food in the Last Year: Never true    Ran Out of Food in the Last Year: Never true  Transportation Needs: No Transportation Needs (08/17/2020)   PRAPARE - Administrator, Civil Service (Medical): No    Lack of Transportation (Non-Medical): No  Physical Activity: Sufficiently Active (08/17/2020)   Exercise Vital Sign    Days of Exercise per Week: 7 days    Minutes of Exercise per  Session: 90 min  Stress: No Stress Concern Present (08/17/2020)   [employer] of Occupational Health - Occupational Stress Questionnaire    Feeling of Stress : Only a little  Social Connections: Moderately Isolated (08/17/2020)   Social Connection and Isolation Panel [NHANES]    Frequency of Communication with Friends and Family: More than three times a week    Frequency of Social Gatherings with Friends and Family: Twice a week    Attends Religious Services: More than 4 times per year    Active Member of [employer] or Organizations: No    Attends Banker Meetings: Never    Marital Status: Never married  Intimate Partner Violence: Not At Risk (08/17/2020)   Humiliation, Afraid, Rape, and Kick questionnaire    Fear of Current or Ex-Partner: No    Emotionally Abused: No    Physically Abused: No    Sexually Abused: No   Past Surgical History:  Procedure Laterality Date   HERNIA REPAIR Bilateral 1998   WISDOM TOOTH EXTRACTION     Family History  Problem Relation Age of Onset   Healthy Mother    Squamous cell carcinoma Father        upper airway/ oropharynx. required extensive resection 2020   COPD Father    Asthma Brother    Heart attack Maternal Uncle    Other Maternal Grandmother        MVA   Other Maternal Grandfather        fibrosis of lungs   Cancer Paternal Grandmother    COPD Paternal Grandmother    Cancer Paternal Grandfather        lung     Current Outpatient Medications:    ferrous sulfate 324 (65 Fe) MG TBEC, Take 1 tablet (324 mg total) by mouth in the morning and at bedtime. Take with Vit C or orange juice, Disp: 180 tablet, Rfl: 3   ibuprofen (ADVIL) 600 MG tablet, Take 1 tablet (600 mg total) by mouth every 8 (eight) hours as needed., Disp: 30 tablet, Rfl: 0   varenicline (CHANTIX CONTINUING MONTH PAK) 1 MG tablet, Take 1 tablet (1 mg total) by mouth 2 (two) times daily. Continue for 1 month AFTER stopping smoking. Then can stop, Disp: 60 tablet,  Rfl: 4   varenicline (CHANTIX) 0.5 MG tablet, Take 1 tablet (0.5 mg total) by mouth daily for 3 days, THEN 1 tablet (0.5 mg total) 2 (two) times daily for 4 days. Then switch to 1mg  tablets., Disp: 11 tablet, Rfl: 0  Allergies  Allergen Reactions   Bee Venom Anaphylaxis and Hives     ROS: Review of Systems Pertinent items noted in HPI and remainder of comprehensive ROS otherwise negative.    Physical exam BP 119/78   Pulse 76   Temp 97.6 F (36.4 C)   Ht 5\' 9"  (1.753 m)   Wt 133 lb (60.3 kg)   LMP 04/29/2022 (Exact Date)  SpO2 99%   Breastfeeding No   BMI 19.64 kg/m  General appearance: alert, cooperative, appears stated age, and no distress Head: Normocephalic, without obvious abnormality, atraumatic Eyes: negative findings: lids and lashes normal, conjunctivae and sclerae normal, corneas clear, and pupils equal, round, reactive to light and accomodation Ears: normal TM's and external ear canals both ears Nose: Nares normal. Septum midline. Mucosa normal. No drainage or sinus tenderness. Throat: lips, mucosa, and tongue normal; teeth and gums normal Neck: no adenopathy, no JVD, supple, symmetrical, trachea midline, and slight thyroid fullness Back: symmetric, no curvature. ROM normal. No CVA tenderness. Lungs: clear to auscultation bilaterally Heart: regular rate and rhythm, S1, S2 normal, no murmur, click, rub or gallop Abdomen: soft, non-tender; bowel sounds normal; no masses,  no organomegaly Extremities: extremities normal, atraumatic, no cyanosis or edema Pulses: 2+ and symmetric Skin: Skin color, texture, turgor normal. No rashes or lesions or small varicose veins noted along bilateral feet Lymph nodes: Cervical, supraclavicular, and axillary nodes normal. Neurologic: Grossly normal Psych: Mood stable, speech normal, affect appropriate  Flowsheet Row Office Visit from 05/01/2022 in [facility]  PHQ-2 Total Score 0       Assessment/  Plan: Florencia Reasons here for annual physical exam.   Annual physical exam  Iron deficiency anemia, unspecified iron deficiency anemia type - Plan: CBC, Iron, TIBC and Ferritin Panel  Asymptomatic varicose veins of both lower extremities - Plan: Compression stockings  Tobacco use - Plan: varenicline (CHANTIX CONTINUING MONTH PAK) 1 MG tablet, varenicline (CHANTIX) 0.5 MG tablet  Screening for metabolic disorder - Plan: TSH, T4, free, CMP14+EGFR, Lipid panel  Patient desires pregnancy  Anemia was improving upon last hemoglobin check.  We will plan to check this again in the next 3 to [redacted] weeks along with iron, TIBC and ferritin  I have given her prescription for compression hose and sent this to [facility].  Her varicose veins are not symptomatic except for from a cosmetic standpoint and so no referral to vascular surgery placed today  I counseled her on options for tobacco cessation and she wishes to proceed with Chantix.  No apparent contraindications  She will come in for fasting labs in 3 to 4 weeks  To see her back in about 3 months.  She will follow-up with OB/GYN for fertility discussion  Mallisa Alameda M. Lajuana Ripple, DO

## 2022-05-01 NOTE — Telephone Encounter (Signed)
Pt aware she can bring in

## 2022-05-01 NOTE — Patient Instructions (Signed)
Come in for fasting labs in 1 month (will check iron/ hemoglobin level and cholesterol)  Quitting Smoking You will learn how quitting smoking can help prevent a variety of health problems and improve your health. To view the content, go to this web address: [email or web address]  This video will expire on: 02/07/2024. If you need access to this video following this date, please reach out to the healthcare provider who assigned it to you. This information is not intended to replace advice given to you by your health care provider. Make sure you discuss any questions you have with your health care provider. Elsevier Patient Education  [place].

## 2022-05-02 LAB — FECAL OCCULT BLOOD, IMMUNOCHEMICAL: Fecal Occult Bld: NEGATIVE

## 2022-08-01 ENCOUNTER — Ambulatory Visit: Payer: Medicaid Other | Admitting: Family Medicine

## 2022-08-01 ENCOUNTER — Encounter: Payer: Self-pay | Admitting: Family Medicine

## 2022-08-01 NOTE — Progress Notes (Deleted)
Subjective: CC:*** PCP: Raliegh Ip, DO PZW:CHENIDPO Debra [name] is a 31 y.o. female presenting to clinic today for:  1. Tobacco use Started on Chantix in June ***   ROS: Per HPI  Allergies  Allergen Reactions   Bee Venom Anaphylaxis and Hives   Past Medical History:  Diagnosis Date   Contraceptive management 11/27/2014   Postpartum hypertension 2018    Current Outpatient Medications:    ferrous sulfate 324 (65 Fe) MG TBEC, Take 1 tablet (324 mg total) by mouth in the morning and at bedtime. Take with Vit C or orange juice, Disp: 180 tablet, Rfl: 3   ibuprofen (ADVIL) 600 MG tablet, Take 1 tablet (600 mg total) by mouth every 8 (eight) hours as needed., Disp: 30 tablet, Rfl: 0   varenicline (CHANTIX CONTINUING MONTH PAK) 1 MG tablet, Take 1 tablet (1 mg total) by mouth 2 (two) times daily. Continue for 1 month AFTER stopping smoking. Then can stop, Disp: 60 tablet, Rfl: 4 Social History   Socioeconomic History   Marital status: Single    Spouse name: Not on file   Number of children: 1   Years of education: Not on file   Highest education level: Not on file  Occupational History   Not on file  Tobacco Use   Smoking status: Every Day    Packs/day: 1.00    Years: 14.00    Total pack years: 14.00    Types: Cigarettes   Smokeless tobacco: Never  Vaping Use   Vaping Use: Never used  Substance and Sexual Activity   Alcohol use: No    Comment: occ   Drug use: No   Sexual activity: Yes    Birth control/protection: None  Other Topics Concern   Not on file  Social History Narrative   Married. Has 1 living child that is 76 years old.    Works from home   Smokes 1 ppd since age 35.   Social Determinants of Health   Financial Resource Strain: Low Risk  (08/17/2020)   Overall Financial Resource Strain (CARDIA)    Difficulty of Paying Living Expenses: Not hard at all  Food Insecurity: No Food Insecurity (08/17/2020)   Hunger Vital Sign    Worried About Running  Out of Food in the Last Year: Never true    Ran Out of Food in the Last Year: Never true  Transportation Needs: No Transportation Needs (08/17/2020)   PRAPARE - Administrator, Civil Service (Medical): No    Lack of Transportation (Non-Medical): No  Physical Activity: Sufficiently Active (08/17/2020)   Exercise Vital Sign    Days of Exercise per Week: 7 days    Minutes of Exercise per Session: 90 min  Stress: No Stress Concern Present (08/17/2020)   [employer] of Occupational Health - Occupational Stress Questionnaire    Feeling of Stress : Only a little  Social Connections: Moderately Isolated (08/17/2020)   Social Connection and Isolation Panel [NHANES]    Frequency of Communication with Friends and Family: More than three times a week    Frequency of Social Gatherings with Friends and Family: Twice a week    Attends Religious Services: More than 4 times per year    Active Member of [employer] or Organizations: No    Attends Banker Meetings: Never    Marital Status: Never married  Intimate Partner Violence: Not At Risk (08/17/2020)   Humiliation, Afraid, Rape, and Kick questionnaire    Fear of Current  or Ex-Partner: No    Emotionally Abused: No    Physically Abused: No    Sexually Abused: No   Family History  Problem Relation Age of Onset   Healthy Mother    Squamous cell carcinoma Father        upper airway/ oropharynx. required extensive resection 2020   COPD Father    Asthma Brother    Heart attack Maternal Uncle    Other Maternal Grandmother        MVA   Other Maternal Grandfather        fibrosis of lungs   Cancer Paternal Grandmother    COPD Paternal Grandmother    Cancer Paternal Grandfather        lung     Objective: Office vital signs reviewed. There were no vitals taken for this visit.  Physical Examination:  General: Awake, alert, *** nourished, No acute distress HEENT: Normal    Neck: No masses palpated. No lymphadenopathy     Ears: Tympanic membranes intact, normal light reflex, no erythema, no bulging    Eyes: PERRLA, extraocular membranes intact, sclera ***    Nose: nasal turbinates moist, *** nasal discharge    Throat: moist mucus membranes, no erythema, *** tonsillar exudate.  Airway is patent Cardio: regular rate and rhythm, S1S2 heard, no murmurs appreciated Pulm: clear to auscultation bilaterally, no wheezes, rhonchi or rales; normal work of breathing on room air GI: soft, non-tender, non-distended, bowel sounds present x4, no hepatomegaly, no splenomegaly, no masses GU: external vaginal tissue ***, cervix ***, *** punctate lesions on cervix appreciated, *** discharge from cervical os, *** bleeding, *** cervical motion tenderness, *** abdominal/ adnexal masses Extremities: warm, well perfused, No edema, cyanosis or clubbing; +*** pulses bilaterally MSK: *** gait and *** station Skin: dry; intact; no rashes or lesions Neuro: *** Strength and light touch sensation grossly intact, *** DTRs ***/4  Assessment/ Plan: 31 y.o. female   ***  No orders of the defined types were placed in this encounter.  No orders of the defined types were placed in this encounter.    Raliegh Ip, DO [facility] [phone]

## 2022-09-08 ENCOUNTER — Other Ambulatory Visit: Payer: Medicaid Other

## 2022-09-08 DIAGNOSIS — D509 Iron deficiency anemia, unspecified: Secondary | ICD-10-CM | POA: Diagnosis not present

## 2022-09-08 DIAGNOSIS — Z136 Encounter for screening for cardiovascular disorders: Secondary | ICD-10-CM | POA: Diagnosis not present

## 2022-09-08 DIAGNOSIS — R6889 Other general symptoms and signs: Secondary | ICD-10-CM | POA: Diagnosis not present

## 2022-09-08 DIAGNOSIS — Z13228 Encounter for screening for other metabolic disorders: Secondary | ICD-10-CM

## 2022-09-09 LAB — CMP14+EGFR
ALT: 30 IU/L (ref 0–32)
AST: 50 IU/L — ABNORMAL HIGH (ref 0–40)
Albumin/Globulin Ratio: 1.2 (ref 1.2–2.2)
Albumin: 3.6 g/dL — ABNORMAL LOW (ref 3.9–4.9)
Alkaline Phosphatase: 73 IU/L (ref 44–121)
BUN/Creatinine Ratio: 11 (ref 9–23)
BUN: 8 mg/dL (ref 6–20)
Bilirubin Total: 0.2 mg/dL (ref 0.0–1.2)
CO2: 18 mmol/L — ABNORMAL LOW (ref 20–29)
Calcium: 8.9 mg/dL (ref 8.7–10.2)
Chloride: 102 mmol/L (ref 96–106)
Creatinine, Ser: 0.71 mg/dL (ref 0.57–1.00)
Globulin, Total: 2.9 g/dL (ref 1.5–4.5)
Glucose: 82 mg/dL (ref 70–99)
Potassium: 4.4 mmol/L (ref 3.5–5.2)
Sodium: 140 mmol/L (ref 134–144)
Total Protein: 6.5 g/dL (ref 6.0–8.5)
eGFR: 117 mL/min/{1.73_m2} (ref >59)

## 2022-09-09 LAB — LIPID PANEL
Chol/HDL Ratio: 2.1 ratio (ref 0.0–4.4)
Cholesterol, Total: 126 mg/dL (ref 100–199)
HDL: 61 mg/dL (ref >39)
LDL Chol Calc (NIH): 39 mg/dL (ref 0–99)
Triglycerides: 157 mg/dL — ABNORMAL HIGH (ref 0–149)
VLDL Cholesterol Cal: 26 mg/dL (ref 5–40)

## 2022-09-09 LAB — CBC
Hematocrit: 40.1 % (ref 34.0–46.6)
Hemoglobin: 13.3 g/dL (ref 11.1–15.9)
MCH: 32 pg (ref 26.6–33.0)
MCHC: 33.2 g/dL (ref 31.5–35.7)
MCV: 97 fL (ref 79–97)
Platelets: 150 10*3/uL (ref 150–450)
RBC: 4.15 x10E6/uL (ref 3.77–5.28)
RDW: 15.1 % (ref 11.7–15.4)
WBC: 5.7 10*3/uL (ref 3.4–10.8)

## 2022-09-09 LAB — IRON,TIBC AND FERRITIN PANEL
Ferritin: 39 ng/mL (ref 15–150)
Iron Saturation: 11 % — ABNORMAL LOW (ref 15–55)
Iron: 31 ug/dL (ref 27–159)
Total Iron Binding Capacity: 282 ug/dL (ref 250–450)
UIBC: 251 ug/dL (ref 131–425)

## 2022-09-09 LAB — TSH: TSH: 0.687 u[IU]/mL (ref 0.450–4.500)

## 2022-09-09 LAB — T4, FREE: Free T4: 0.73 ng/dL — ABNORMAL LOW (ref 0.82–1.77)

## 2022-09-11 ENCOUNTER — Other Ambulatory Visit: Payer: Self-pay | Admitting: Family Medicine

## 2022-09-11 ENCOUNTER — Encounter: Payer: Self-pay | Admitting: Family Medicine

## 2022-09-11 DIAGNOSIS — E039 Hypothyroidism, unspecified: Secondary | ICD-10-CM

## 2022-09-11 MED ORDER — LEVOTHYROXINE SODIUM 50 MCG PO TABS: 50.0000 ug | ORAL_TABLET | Freq: Every day | ORAL | 3 refills | Status: DC

## 2022-10-16 ENCOUNTER — Ambulatory Visit: Payer: Medicaid Other | Admitting: Family Medicine

## 2022-10-17 ENCOUNTER — Encounter: Payer: Self-pay | Admitting: Family Medicine

## 2022-10-20 ENCOUNTER — Other Ambulatory Visit: Payer: Medicaid Other

## 2022-10-20 DIAGNOSIS — E039 Hypothyroidism, unspecified: Secondary | ICD-10-CM | POA: Diagnosis not present

## 2022-10-21 LAB — TSH: TSH: 0.762 u[IU]/mL (ref 0.450–4.500)

## 2022-10-21 LAB — THYROID PEROXIDASE ANTIBODY: Thyroperoxidase Ab SerPl-aCnc: 16 IU/mL (ref 0–34)

## 2022-10-21 LAB — THYROGLOBULIN ANTIBODY: Thyroglobulin Antibody: <1 IU/mL (ref 0.0–0.9)

## 2022-10-21 LAB — T4, FREE: Free T4: 0.83 ng/dL (ref 0.82–1.77)

## 2022-11-08 DIAGNOSIS — R5381 Other malaise: Secondary | ICD-10-CM | POA: Diagnosis not present

## 2022-11-08 DIAGNOSIS — S01112A Laceration without foreign body of left eyelid and periocular area, initial encounter: Secondary | ICD-10-CM | POA: Diagnosis not present

## 2022-11-08 DIAGNOSIS — R000 Tachycardia, unspecified: Secondary | ICD-10-CM | POA: Diagnosis not present

## 2022-11-08 DIAGNOSIS — R52 Pain, unspecified: Secondary | ICD-10-CM | POA: Diagnosis not present

## 2022-11-08 DIAGNOSIS — W228XXA Striking against or struck by other objects, initial encounter: Secondary | ICD-10-CM | POA: Diagnosis not present

## 2022-12-08 ENCOUNTER — Encounter: Payer: Self-pay | Admitting: Family Medicine

## 2022-12-08 ENCOUNTER — Ambulatory Visit (INDEPENDENT_AMBULATORY_CARE_PROVIDER_SITE_OTHER): Payer: Medicaid Other | Admitting: Family Medicine

## 2022-12-08 VITALS — BP 109/77 | HR 77 | Temp 98.6°F | Ht 69.0 in | Wt 125.0 lb

## 2022-12-08 DIAGNOSIS — F321 Major depressive disorder, single episode, moderate: Secondary | ICD-10-CM

## 2022-12-08 DIAGNOSIS — Z634 Disappearance and death of family member: Secondary | ICD-10-CM

## 2022-12-08 DIAGNOSIS — E039 Hypothyroidism, unspecified: Secondary | ICD-10-CM

## 2022-12-08 DIAGNOSIS — D509 Iron deficiency anemia, unspecified: Secondary | ICD-10-CM

## 2022-12-08 DIAGNOSIS — F4321 Adjustment disorder with depressed mood: Secondary | ICD-10-CM | POA: Diagnosis not present

## 2022-12-08 DIAGNOSIS — F4329 Adjustment disorder with other symptoms: Secondary | ICD-10-CM

## 2022-12-08 DIAGNOSIS — F1010 Alcohol abuse, uncomplicated: Secondary | ICD-10-CM | POA: Diagnosis not present

## 2022-12-08 NOTE — Progress Notes (Signed)
Subjective: CC: Depression PCP: Debra Ip, DO CZY:SAYTKZSW [name] is a 32 y.o. female presenting to clinic today for:  1.  Depressive disorder Patient continues to have quite a bit of depression.  This is secondary to loss of her pregnancy about a year ago.  She notes that this has impacted her quite severely.  She is actively drinking anywhere between 3 and 8 beers per night.  She considers herself a "functional alcoholic".  She certainly is motivated to come off of alcohol and get help and is here today to discuss that further.  She has never tried coming off of the alcohol because she is scared of having withdrawal.  She feels well supported by the father of her other child but notes that they are not "together".  She is not treated with any multivitamins  She never started the Chantix because she was concerned about the side effects.  She did not want to have any "hallucinations".  2.  Hypothyroidism She discontinued the Synthroid after her labs were normal.  She did not realize that this was something that she would be on lifelong to manage these levels.  She reports depression as above.  She is fatigued at least 3 to 4 days/week.  She pushes through for her child.  3.  Iron deficiency anemia Also stopped the iron due to improved levels.  She notes that the ferrous sulfate 325 twice daily was causing nausea and abdominal pain.  ROS: Per HPI  Allergies  Allergen Reactions   Bee Venom Anaphylaxis and Hives   Past Medical History:  Diagnosis Date   Contraceptive management 11/27/2014   Postpartum hypertension 2018    Current Outpatient Medications:    ferrous sulfate 324 (65 Fe) MG TBEC, Take 1 tablet (324 mg total) by mouth in the morning and at bedtime. Take with Vit C or orange juice, Disp: 180 tablet, Rfl: 3   ibuprofen (ADVIL) 600 MG tablet, Take 1 tablet (600 mg total) by mouth every 8 (eight) hours as needed., Disp: 30 tablet, Rfl: 0   varenicline (CHANTIX  CONTINUING MONTH PAK) 1 MG tablet, Take 1 tablet (1 mg total) by mouth 2 (two) times daily. Continue for 1 month AFTER stopping smoking. Then can stop, Disp: 60 tablet, Rfl: 4   levothyroxine (SYNTHROID) 50 MCG tablet, Take 1 tablet (50 mcg total) by mouth daily. (Patient not taking: Reported on 12/08/2022), Disp: 90 tablet, Rfl: 3 Social History   Socioeconomic History   Marital status: Single    Spouse name: Not on file   Number of children: 1   Years of education: Not on file   Highest education level: Not on file  Occupational History   Not on file  Tobacco Use   Smoking status: Every Day    Packs/day: 1.00    Years: 14.00    Total pack years: 14.00    Types: Cigarettes   Smokeless tobacco: Never  Vaping Use   Vaping Use: Never used  Substance and Sexual Activity   Alcohol use: No    Comment: occ   Drug use: No   Sexual activity: Yes    Birth control/protection: None  Other Topics Concern   Not on file  Social History Narrative   Married. Has 1 living child that is 14 years old.    Works from home   Smokes 1 ppd since age 70.   Social Determinants of Health   Financial Resource Strain: Low Risk  (08/17/2020)   Overall  Financial Resource Strain (CARDIA)    Difficulty of Paying Living Expenses: Not hard at all  Food Insecurity: No Food Insecurity (08/17/2020)   Hunger Vital Sign    Worried About Running Out of Food in the Last Year: Never true    Ran Out of Food in the Last Year: Never true  Transportation Needs: No Transportation Needs (08/17/2020)   PRAPARE - Hydrologist (Medical): No    Lack of Transportation (Non-Medical): No  Physical Activity: Sufficiently Active (08/17/2020)   Exercise Vital Sign    Days of Exercise per Week: 7 days    Minutes of Exercise per Session: 90 min  Stress: No Stress Concern Present (08/17/2020)   [place]    Feeling of Stress : Only a  little  Social Connections: Moderately Isolated (08/17/2020)   Social Connection and Isolation Panel [NHANES]    Frequency of Communication with Friends and Family: More than three times a week    Frequency of Social Gatherings with Friends and Family: Twice a week    Attends Religious Services: More than 4 times per year    Active Member of [employer] or Organizations: No    Attends Archivist Meetings: Never    Marital Status: Never married  Intimate Partner Violence: Not At Risk (08/17/2020)   Humiliation, Afraid, Rape, and Kick questionnaire    Fear of Current or Ex-Partner: No    Emotionally Abused: No    Physically Abused: No    Sexually Abused: No   Family History  Problem Relation Age of Onset   Healthy Mother    Squamous cell carcinoma Father        upper airway/ oropharynx. required extensive resection 2020   COPD Father    Asthma Brother    Heart attack Maternal Uncle    Other Maternal Grandmother        MVA   Other Maternal Grandfather        fibrosis of lungs   Cancer Paternal Grandmother    COPD Paternal Grandmother    Cancer Paternal Grandfather        lung     Objective: Office vital signs reviewed. BP 109/77   Pulse 77   Temp 98.6 F (37 C)   Ht 5\' 9"  (1.753 m)   Wt 125 lb (56.7 kg)   LMP 12/07/2022 Comment: started  SpO2 98%   BMI 18.46 kg/m   Physical Examination:  General: Awake, alert, well nourished, No acute distress HEENT: sclera white, MMM.  No exophthalmos.  No conjunctival pallor Cardio: regular rate and rhythm, S1S2 heard, no murmurs appreciated Pulm: clear to auscultation bilaterally, no wheezes, rhonchi or rales; normal work of breathing on room air Neuro: No tremor Psych: Mood depressed.  Patient is tearful.     05/01/2022    2:11 PM 06/22/2021   10:25 AM 08/17/2020   12:02 PM  Depression screen PHQ 2/9  Decreased Interest 0 0 0  Down, Depressed, Hopeless 0 0 0  PHQ - 2 Score 0 0 0  Altered sleeping  2 1  Tired, decreased  energy  1 0  Change in appetite  0 0  Feeling bad or failure about yourself   0 0  Trouble concentrating  0 0  Moving slowly or fidgety/restless  0 0  Suicidal thoughts  0 0  PHQ-9 Score  3 1      12/08/2022    2:28 PM  05/01/2022    2:11 PM 06/22/2021   10:25 AM 08/17/2020   12:01 PM  GAD 7 : Generalized Anxiety Score  Nervous, Anxious, on Edge 0 0 1 0  Control/stop worrying 1 0 0 0  Worry too much - different things 0 0 1 0  Trouble relaxing 0 0 1 1  Restless 1 0 0 0  Easily annoyed or irritable 0 0 0 0  Afraid - awful might happen 0 0 0 0  Total GAD 7 Score 2 0 3 1  Anxiety Difficulty Not difficult at all Not difficult at all      Assessment/ Plan: 32 y.o. female   Depression, major, single episode, moderate ([place]) - Plan: Ambulatory referral to Psychology  Prolonged grief reaction - Plan: Ambulatory referral to Psychology  Grief at loss of child - Plan: Ambulatory referral to Psychology  Excessive drinking alcohol - Plan: Ambulatory referral to Psychology  Iron deficiency anemia, unspecified iron deficiency anemia type - Plan: ferrous sulfate (SLOW RELEASE IRON) 160 (50 Fe) MG TBCR SR tablet  Acquired hypothyroidism  Depression uncontrolled.  Stat referral to therapist.  She is very amenable to having a counselor.  She admitted to having very difficult time to taking medication so we are not going to initiate any meds today.  Instead we discussed a plan to start tapering from alcohol use.  I would like to reduce by 1-2 beers per day until off.  We discussed the risks of sudden discontinuation of alcohol including seizure disorder and other adverse events.  If she is unsuccessful with self titrating we can certainly consider something like a benzodiazepine but we discussed that this would likely be a every 6 hours for at least 1 day.  I am going to see her closely back in the next couple of weeks, sooner if concerns arise.  I also counseled her on hypothyroidism and likely  lifelong need for thyroid replacement.  We will plan to defer labs until next visit as she is going to restart this medicine.   I sent in slow iron in efforts to improve compliance and tolerability of iron.  She had difficulty with twice daily dosing due to nausea and abdominal pain of the ferrous sulfate 325 so hopefully will see improvement with this slow release iron.  Discussed utilizing this in the evening time so as to be totally separate from her thyroid replacement medication.  Suspect much of her vitamin deficiency to be secondary to excessive alcohol use  No orders of the defined types were placed in this encounter.  No orders of the defined types were placed in this encounter.    Janora Norlander, DO [place] [phone]

## 2022-12-11 MED ORDER — SLOW RELEASE IRON 160 (50 FE) MG PO TBCR: 1.0000 | EXTENDED_RELEASE_TABLET | Freq: Every day | ORAL | 3 refills | Status: AC

## 2023-01-05 ENCOUNTER — Emergency Department (HOSPITAL_COMMUNITY): Payer: Medicaid Other

## 2023-01-05 ENCOUNTER — Other Ambulatory Visit: Payer: Self-pay

## 2023-01-05 ENCOUNTER — Encounter (HOSPITAL_COMMUNITY): Payer: Self-pay

## 2023-01-05 ENCOUNTER — Emergency Department (HOSPITAL_COMMUNITY)
Admission: EM | Admit: 2023-01-05 | Discharge: 2023-01-05 | Disposition: A | Discharge status: Home / Self Care | Payer: Medicaid Other | Attending: Emergency Medicine | Admitting: Emergency Medicine

## 2023-01-05 DIAGNOSIS — I10 Essential (primary) hypertension: Secondary | ICD-10-CM | POA: Diagnosis not present

## 2023-01-05 DIAGNOSIS — R079 Chest pain, unspecified: Secondary | ICD-10-CM | POA: Diagnosis not present

## 2023-01-05 DIAGNOSIS — R0602 Shortness of breath: Secondary | ICD-10-CM

## 2023-01-05 DIAGNOSIS — R000 Tachycardia, unspecified: Secondary | ICD-10-CM | POA: Diagnosis not present

## 2023-01-05 DIAGNOSIS — R0789 Other chest pain: Secondary | ICD-10-CM | POA: Insufficient documentation

## 2023-01-05 DIAGNOSIS — R0682 Tachypnea, not elsewhere classified: Secondary | ICD-10-CM | POA: Diagnosis not present

## 2023-01-05 DIAGNOSIS — Z20822 Contact with and (suspected) exposure to covid-19: Secondary | ICD-10-CM | POA: Insufficient documentation

## 2023-01-05 DIAGNOSIS — F17200 Nicotine dependence, unspecified, uncomplicated: Secondary | ICD-10-CM | POA: Diagnosis not present

## 2023-01-05 LAB — CBC WITH DIFFERENTIAL/PLATELET
Abs Immature Granulocytes: 0.01 10*3/uL (ref 0.00–0.07)
Basophils Absolute: 0 10*3/uL (ref 0.0–0.1)
Basophils Relative: 1 %
Eosinophils Absolute: 0 10*3/uL (ref 0.0–0.5)
Eosinophils Relative: 1 %
HCT: 35.8 % — ABNORMAL LOW (ref 36.0–46.0)
Hemoglobin: 12.5 g/dL (ref 12.0–15.0)
Immature Granulocytes: 0 %
Lymphocytes Relative: 27 %
Lymphs Abs: 1.6 10*3/uL (ref 0.7–4.0)
MCH: 31.4 pg (ref 26.0–34.0)
MCHC: 34.9 g/dL (ref 30.0–36.0)
MCV: 89.9 fL (ref 80.0–100.0)
Monocytes Absolute: 0.4 10*3/uL (ref 0.1–1.0)
Monocytes Relative: 7 %
Neutro Abs: 3.8 10*3/uL (ref 1.7–7.7)
Neutrophils Relative %: 64 %
Platelets: 198 10*3/uL (ref 150–400)
RBC: 3.98 MIL/uL (ref 3.87–5.11)
RDW: 15.4 % (ref 11.5–15.5)
WBC: 5.9 10*3/uL (ref 4.0–10.5)
nRBC: 0 % (ref 0.0–0.2)

## 2023-01-05 LAB — BASIC METABOLIC PANEL
Anion gap: 13 (ref 5–15)
BUN: 6 mg/dL (ref 6–20)
CO2: 19 mmol/L — ABNORMAL LOW (ref 22–32)
Calcium: 8.6 mg/dL — ABNORMAL LOW (ref 8.9–10.3)
Chloride: 103 mmol/L (ref 98–111)
Creatinine, Ser: 0.68 mg/dL (ref 0.44–1.00)
GFR, Estimated: >60 mL/min (ref >60)
Glucose, Bld: 105 mg/dL — ABNORMAL HIGH (ref 70–99)
Potassium: 3.6 mmol/L (ref 3.5–5.1)
Sodium: 135 mmol/L (ref 135–145)

## 2023-01-05 LAB — HCG, QUANTITATIVE, PREGNANCY: hCG, Beta Chain, Quant, S: <1 m[IU]/mL (ref <5)

## 2023-01-05 LAB — TSH: TSH: 0.775 u[IU]/mL (ref 0.350–4.500)

## 2023-01-05 LAB — RESP PANEL BY RT-PCR (RSV, FLU A&B, COVID)  RVPGX2
Influenza A by PCR: NEGATIVE
Influenza B by PCR: NEGATIVE
Resp Syncytial Virus by PCR: NEGATIVE
SARS Coronavirus 2 by RT PCR: NEGATIVE

## 2023-01-05 NOTE — Discharge Instructions (Signed)
You have been evaluated for your symptoms.  Fortunately no concerning finding were noted on today's exam.  Your chest x-ray did not show any signs of pneumonia.  Your COVID/flu/RSV test is currently pending but you can check the result through MyChart, link below.  If you develop worsening symptoms do not hesitate to return to the ER for further evaluation.

## 2023-01-05 NOTE — ED Triage Notes (Addendum)
Pt c/o SHOB since last night. Pt c/o generalized chest tightness. Pt is a smoker.Pt had some numbness in extremities that comes and goes. Pt states dyspnea with exertion. Pt used daughter's inhaler without relief. Pt's daughter is just getting over a PNA

## 2023-01-05 NOTE — ED Provider Notes (Signed)
Debra [name]   CSN: [id number] Arrival date & time: 01/05/23  1010     History  Chief Complaint  Patient presents with   Shortness of [place] is a 33 y.o. female.  The history is provided by the patient and medical records. No language interpreter was used.  Shortness of Breath    32 year old female significant history of tobacco use, postpartum hypertension, presenting complaining of shortness of breath.  Patient reports since today she has had progressive worsening shortness of breath, chest tightness, tingling sensation to hands and feet with generalized weakness.  Symptoms moderate in severity she tries using her daughter's inhaler but noticed no improvement.  Daughter recently had pneumonia.  She however denies having any fever chills no runny nose sneezing sore throat.  She has some mild loose stool but denies nausea or vomiting.  No urinary symptoms.  No prior history of PE or DVT no recent surgery prolonged bedrest active cancer hemoptysis taking oral hormone recent travel or leg swelling or calf pain.  She does have history of thyroid disease and currently taking levothyroxine.  She denies any neck pain or trouble swallowing.  Home Medications Prior to Admission medications   Medication Sig Start Date End Date Taking? Authorizing Provider  ferrous sulfate (SLOW RELEASE IRON) 160 (50 Fe) MG TBCR SR tablet Take 1 tablet (160 mg total) by mouth daily. 12/11/22   Janora Norlander, DO  ibuprofen (ADVIL) 600 MG tablet Take 1 tablet (600 mg total) by mouth every 8 (eight) hours as needed. 04/12/22   Ivy Lynn, NP  levothyroxine (SYNTHROID) 50 MCG tablet Take 1 tablet (50 mcg total) by mouth daily. Patient not taking: Reported on 12/08/2022 09/11/22   Janora Norlander, DO  varenicline (CHANTIX CONTINUING MONTH PAK) 1 MG tablet Take 1 tablet (1 mg total) by mouth 2 (two) times daily. Continue for 1  month AFTER stopping smoking. Then can stop 05/01/22   Janora Norlander, DO      Allergies    Bee venom    Review of Systems   Review of Systems  Respiratory:  Positive for shortness of breath.   All other systems reviewed and are negative.   Physical Exam Updated Vital Signs BP (!) 140/97 (BP Location: Left Arm)   Pulse 90   Temp 98.1 F (36.7 C) (Oral)   Resp 20   Ht 5' 9"$  (1.753 m)   Wt 56.2 kg   LMP 12/07/2022 Comment: started  SpO2 100%   BMI 18.31 kg/m  Physical Exam Vitals and nursing [name] reviewed.  Constitutional:      General: She is not in acute distress.    Appearance: She is well-developed.  HENT:     Head: Atraumatic.  Eyes:     Conjunctiva/sclera: Conjunctivae normal.  Cardiovascular:     Rate and Rhythm: Normal rate and regular rhythm.  Pulmonary:     Effort: Pulmonary effort is normal.     Breath sounds: No decreased breath sounds, wheezing, rhonchi or rales.  Chest:     Chest wall: No tenderness.  Musculoskeletal:     Cervical back: Neck supple.     Right lower leg: No edema.     Left lower leg: No edema.  Skin:    Findings: No rash.  Neurological:     Mental Status: She is alert.  Psychiatric:        Mood and Affect: Mood normal.  ED Results / Procedures / Treatments   Labs (all labs ordered are listed, but only abnormal results are displayed) Labs Reviewed  BASIC METABOLIC PANEL - Abnormal; Notable for the following components:      Result Value   CO2 19 (*)    Glucose, Bld 105 (*)    Calcium 8.6 (*)    All other components within normal limits  CBC WITH DIFFERENTIAL/PLATELET - Abnormal; Notable for the following components:   HCT 35.8 (*)    All other components within normal limits  RESP PANEL BY RT-PCR (RSV, FLU A&B, COVID)  RVPGX2  TSH  HCG, QUANTITATIVE, PREGNANCY    EKG None ED ECG REPORT   Date: 01/05/2023  Rate: 88  Rhythm: normal sinus rhythm  QRS Axis: left  Intervals: PR shortened  ST/T Wave  abnormalities: nonspecific ST changes  Conduction Disutrbances:none  Narrative Interpretation:   Old EKG Reviewed: unchanged  I have personally reviewed the EKG tracing and agree with the computerized printout as noted.   Radiology DG Chest 2 View  Result Date: 01/05/2023 CLINICAL DATA:  Shortness of breath, chest pain EXAM: CHEST - 2 VIEW COMPARISON:  None Available. FINDINGS: Lungs are clear.  No pleural effusion or pneumothorax. The heart is normal in size. Visualized osseous structures are within normal limits. IMPRESSION: Normal chest radiographs. Electronically Signed   By: Julian Hy M.D.   On: 01/05/2023 11:05    Procedures Procedures    Medications Ordered in ED Medications - No data to display  ED Course/ Medical Decision Making/ A&P                             Medical Decision Making Amount and/or Complexity of Data Reviewed Labs: ordered. Radiology: ordered.   BP (!) 140/97 (BP Location: Left Arm)   Pulse 90   Temp 98.1 F (36.7 C) (Oral)   Resp 20   Ht 5' 9"$  (1.753 m)   Wt 56.2 kg   LMP 12/07/2022 Comment: shielded  SpO2 100%   BMI 18.31 kg/m   72:85 AM  32 year old female significant history of tobacco use, postpartum hypertension, presenting complaining of shortness of breath.  Patient reports since today she has had progressive worsening shortness of breath, chest tightness, tingling sensation to hands and feet with generalized weakness.  Symptoms moderate in severity she tries using her daughter's inhaler but noticed no improvement.  Daughter recently had pneumonia.  She however denies having any fever chills no runny nose sneezing sore throat.  She has some mild loose stool but denies nausea or vomiting.  No urinary symptoms.  No prior history of PE or DVT no recent surgery prolonged bedrest active cancer hemoptysis taking oral hormone recent travel or leg swelling or calf pain.  She does have history of thyroid disease and currently taking  levothyroxine.  She denies any neck pain or trouble swallowing.  On exam this is a well-appearing female laying in bed appears to be in no acute discomfort.  Heart with normal rate and rhythm, lungs are clear to auscultation bilaterally, she does have a goiter noted to her neck.  Abdomen is soft nontender no evidence of peripheral edema and no JVD.  Skin with normal turgor.  Vital sign review and overall reassuring.  Mild hypertension with blood pressure of 140/97.  She is afebrile no hypoxia and normal heart rate.  Patient is PERC negative, low suspicion for PE or DVT. Her HEAR score is  1 doubt MACE. Workup initiated.  -Labs ordered, independently viewed and interpreted by me.  Labs remarkable for normal TSH, normal WBC, normal H&H, preg test is negative -The patient was maintained on a cardiac monitor.  I personally viewed and interpreted the cardiac monitored which showed an underlying rhythm of: NSR -Imaging independently viewed and interpreted by me and I agree with radiologist's interpretation.  Result remarkable for CXR without acute changes -This patient presents to the ED for concern of sob, this involves an extensive number of treatment options, and is a complaint that carries with it a high risk of complications and morbidity.  The differential diagnosis includes covid, flu, rsv, pna, pe, ptx, asthma, copd, reactive airway disease, thyroid storm -Co morbidities that complicate the patient evaluation includes thyroid disease, HTN -Treatment include: none  -Reevaluation of the patient after these medicines showed that the patient improved -PCP office notes or outside notes reviewed -Escalation to admission/observation considered: patients feels much better, is comfortable with discharge, and will follow up with PCP -Prescription medication considered, patient comfortable with OTC medication -Social Determinant of Health considered which includes tobacco use  1:42 PM Workup today is  overall reassuring, no evidence of pneumonia, no anemia, no electrolyte imbalance, patient is not pregnant, she is PERC negative so therefore have low suspicion for PE, normal TSH as well.  Her COVID flu and RSV test is currently pending but patient can check the result through [facility].  She does not have any hypoxia here and overall symptoms improving without any specific treatment therefore patient is stable to be discharged home.  Outpatient follow-up recommended, return precaution given.         Final Clinical Impression(s) / ED Diagnoses Final diagnoses:  Shortness of breath    Rx / DC Orders ED Discharge Orders     None         Domenic Moras, PA-C 01/05/23 1343    Cristie Hem, MD 01/05/23 1526

## 2023-01-08 ENCOUNTER — Ambulatory Visit (INDEPENDENT_AMBULATORY_CARE_PROVIDER_SITE_OTHER): Payer: Medicaid Other | Admitting: Family Medicine

## 2023-01-08 ENCOUNTER — Encounter: Payer: Self-pay | Admitting: Family Medicine

## 2023-01-08 ENCOUNTER — Telehealth: Payer: Self-pay

## 2023-01-08 VITALS — BP 139/81 | HR 105 | Temp 97.1°F | Resp 20 | Ht 69.0 in | Wt 123.0 lb

## 2023-01-08 DIAGNOSIS — F321 Major depressive disorder, single episode, moderate: Secondary | ICD-10-CM | POA: Diagnosis not present

## 2023-01-08 DIAGNOSIS — F4329 Adjustment disorder with other symptoms: Secondary | ICD-10-CM

## 2023-01-08 DIAGNOSIS — R200 Anesthesia of skin: Secondary | ICD-10-CM

## 2023-01-08 DIAGNOSIS — F1010 Alcohol abuse, uncomplicated: Secondary | ICD-10-CM

## 2023-01-08 DIAGNOSIS — Z720 Tobacco use: Secondary | ICD-10-CM

## 2023-01-08 DIAGNOSIS — R202 Paresthesia of skin: Secondary | ICD-10-CM | POA: Diagnosis not present

## 2023-01-08 DIAGNOSIS — D509 Iron deficiency anemia, unspecified: Secondary | ICD-10-CM | POA: Diagnosis not present

## 2023-01-08 DIAGNOSIS — E039 Hypothyroidism, unspecified: Secondary | ICD-10-CM

## 2023-01-08 DIAGNOSIS — R0609 Other forms of dyspnea: Secondary | ICD-10-CM

## 2023-01-08 LAB — HEMOGLOBIN, FINGERSTICK: Hemoglobin: 11.9 g/dL (ref 11.1–15.9)

## 2023-01-08 NOTE — Telephone Encounter (Signed)
Debra [name] 01/05/2023

## 2023-01-08 NOTE — Telephone Encounter (Signed)
She's on my schedule for 3pm today.  Hasn't showed up yet but thanks for sending me a heads up re: ER visit for SOB.

## 2023-01-08 NOTE — Progress Notes (Signed)
Subjective: CC: Follow-up alcohol intake, hypothyroidism PCP: Janora Norlander, DO [phone] Debra [name] is a 32 y.o. female presenting to clinic today for:  1.  Hypothyroidism/depressive disorder/alcohol use disorder/dyspnea on exertion Patient reports compliance with Synthroid 50 mcg daily.  No reports of tremor, change in appetite.  Energy remains fair after consistent utilization of iron.  She notes that it still causes some intermittent nausea.  She had an episode that she was seen in the ER for a few days ago where she felt short of breath, specifically on exertion.  She denies any lower extremity edema.  No chest pain.  She did experience some chest tightness and thought perhaps she was having an asthma exacerbation and utilize her daughters inhaler but this did not help.  She subsequently sought care in the ER and they had a fairly extensive workup with chest x-ray, cardiac monitoring and labs which were unrevealing.  She notes that did not feel like her typical panic attack.  She has been decreasing alcohol consumption and really is only drinking about 3 drinks every other day.  This is a drastic reduction from what she was doing previously.  She is still not seeing a therapist because she never got a call for an appointment.  She is awaiting this still   ROS: Per HPI  Allergies  Allergen Reactions   Bee Venom Anaphylaxis and Hives   Past Medical History:  Diagnosis Date   Contraceptive management 11/27/2014   Postpartum hypertension 2018    Current Outpatient Medications:    ferrous sulfate (SLOW RELEASE IRON) 160 (50 Fe) MG TBCR SR tablet, Take 1 tablet (160 mg total) by mouth daily., Disp: 90 tablet, Rfl: 3   ibuprofen (ADVIL) 600 MG tablet, Take 1 tablet (600 mg total) by mouth every 8 (eight) hours as needed., Disp: 30 tablet, Rfl: 0   levothyroxine (SYNTHROID) 50 MCG tablet, Take 1 tablet (50 mcg total) by mouth daily. (Patient not taking: Reported on 12/08/2022), Disp: 90  tablet, Rfl: 3   varenicline (CHANTIX CONTINUING MONTH PAK) 1 MG tablet, Take 1 tablet (1 mg total) by mouth 2 (two) times daily. Continue for 1 month AFTER stopping smoking. Then can stop, Disp: 60 tablet, Rfl: 4 Social History   Socioeconomic History   Marital status: Single    Spouse name: Not on file   Number of children: 1   Years of education: Not on file   Highest education level: Not on file  Occupational History   Not on file  Tobacco Use   Smoking status: Every Day    Packs/day: 1.00    Years: 14.00    Total pack years: 14.00    Types: Cigarettes   Smokeless tobacco: Never  Vaping Use   Vaping Use: Never used  Substance and Sexual Activity   Alcohol use: No    Comment: occ   Drug use: No   Sexual activity: Yes    Birth control/protection: None  Other Topics Concern   Not on file  Social History Narrative   Married. Has 1 living child that is 59 years old.    Works from home   Smokes 1 ppd since age 3.   Social Determinants of Health   Financial Resource Strain: Low Risk  (08/17/2020)   Overall Financial Resource Strain (CARDIA)    Difficulty of Paying Living Expenses: Not hard at all  Food Insecurity: No Food Insecurity (08/17/2020)   Hunger Vital Sign    Worried About Running Out of  Food in the Last Year: Never true    [employer] in the Last Year: Never true  Transportation Needs: No Transportation Needs (08/17/2020)   PRAPARE - Hydrologist (Medical): No    Lack of Transportation (Non-Medical): No  Physical Activity: Sufficiently Active (08/17/2020)   Exercise Vital Sign    Days of Exercise per Week: 7 days    Minutes of Exercise per Session: 90 min  Stress: No Stress Concern Present (08/17/2020)   [place]    Feeling of Stress : Only a little  Social Connections: Moderately Isolated (08/17/2020)   Social Connection and Isolation Panel [NHANES]     Frequency of Communication with Friends and Family: More than three times a week    Frequency of Social Gatherings with Friends and Family: Twice a week    Attends Religious Services: More than 4 times per year    Active Member of [employer] or Organizations: No    Attends Archivist Meetings: Never    Marital Status: Never married  Intimate Partner Violence: Not At Risk (08/17/2020)   Humiliation, Afraid, Rape, and Kick questionnaire    Fear of Current or Ex-Partner: No    Emotionally Abused: No    Physically Abused: No    Sexually Abused: No   Family History  Problem Relation Age of Onset   Healthy Mother    Squamous cell carcinoma Father        upper airway/ oropharynx. required extensive resection 2020   COPD Father    Asthma Brother    Heart attack Maternal Uncle    Other Maternal Grandmother        MVA   Other Maternal Grandfather        fibrosis of lungs   Cancer Paternal Grandmother    COPD Paternal Grandmother    Cancer Paternal Grandfather        lung     Objective: Office vital signs reviewed. BP 139/81   Pulse (!) 105   Temp (!) 97.1 F (36.2 C) (Oral)   Resp 20   Ht 5' 9"$  (1.753 m)   Wt 123 lb (55.8 kg)   LMP 12/07/2022 Comment: shielded  SpO2 99%   BMI 18.16 kg/m   Physical Examination:  General: Awake, alert, well nourished, No acute distress HEENT: Sclera white.  No exophthalmos.  No goiter Cardio: regular rate and rhythm, S1S2 heard, no murmurs appreciated Pulm: clear to auscultation bilaterally, no wheezes, rhonchi or rales; normal work of breathing on room air Neuro: No tremor.  No asterixis.  Alert and orient x 3 Psych: Very pleasant, interactive.  Thought process is linear.  Good eye contact.     01/08/2023    3:17 PM 05/01/2022    2:11 PM 06/22/2021   10:25 AM  Depression screen PHQ 2/9  Decreased Interest 1 0 0  Down, Depressed, Hopeless 0 0 0  PHQ - 2 Score 1 0 0  Altered sleeping 1  2  Tired, decreased energy 0  1  Change in  appetite 2  0  Feeling bad or failure about yourself  0  0  Trouble concentrating 0  0  Moving slowly or fidgety/restless 0  0  Suicidal thoughts 0  0  PHQ-9 Score 4  3  Difficult doing work/chores Somewhat difficult        01/08/2023    3:18 PM 12/08/2022    2:28 PM 05/01/2022  2:11 PM 06/22/2021   10:25 AM  GAD 7 : Generalized Anxiety Score  Nervous, Anxious, on Edge  0 0 1  Control/stop worrying 0 1 0 0  Worry too much - different things 0 0 0 1  Trouble relaxing 3 0 0 1  Restless 1 1 0 0  Easily annoyed or irritable 0 0 0 0  Afraid - awful might happen 0 0 0 0  Total GAD 7 Score  2 0 3  Anxiety Difficulty Somewhat difficult Not difficult at all Not difficult at all     Assessment/ Plan: 32 y.o. female   Depression, major, single episode, moderate (HCC)  Prolonged grief reaction  Excessive drinking alcohol - Plan: Ambulatory referral to Cardiology  Acquired hypothyroidism - Plan: TSH, T4, Free, Ambulatory referral to Cardiology  Dyspnea on exertion - Plan: Ambulatory referral to Cardiology, Brain natriuretic peptide, Sedimentation Rate, C-reactive protein, ANA w/Reflex if Positive  Numbness and tingling - Plan: Vitamin B12  Iron deficiency anemia, unspecified iron deficiency anemia type - Plan: Hemoglobin, fingerstick  Tobacco use  I gave her information for the psychiatric referral.  She will contact them for an appointment  She is making good strides with reduction in alcohol use.  Given her new onset dyspnea on exertion, I referred her to cardiology to ensure that we are not having cardiovascular changes related to alcohol and tobacco use.  We performed ambulatory vital signs and there was no drop in oxygen nor significant increase in heart rate with ambulation.  I doubt that this is pulmonary in nature.  However, we could consider obtaining CT of the lungs for further evaluation of her cardiac evaluation is unremarkable.  Also considered possible withdrawal symptoms  from alcohol precipitating anxiety.  However, use of alcohol did not alleviate the symptoms so I am not quite sure that that would be the appropriate etiology either  Will collect a BNP.  I considered possible autoimmune etiology, particular in the setting of hypothyroidism.  Check CRP, ESR and ANA.  Check B12 given reports of transient numbness and tingling in the feet and hands and chronic alcohol use disorder fingerstick hemoglobin performed but her most recent hemoglobin was normal.  I think it is fine for her to hold off on iron supplementation for a couple of weeks to see if there is a recurrent need.  We can retest this level at her next visit  Continue weaning from cigarettes.  Down to less than 2 to 3 cigarettes/day  No orders of the defined types were placed in this encounter.  No orders of the defined types were placed in this encounter.    Janora Norlander, DO [place] [phone]

## 2023-01-09 LAB — VITAMIN B12: Vitamin B-12: 817 pg/mL (ref 232–1245)

## 2023-01-09 LAB — BRAIN NATRIURETIC PEPTIDE: BNP: 19.4 pg/mL (ref 0.0–100.0)

## 2023-01-09 LAB — ANA W/REFLEX IF POSITIVE: Anti Nuclear Antibody (ANA): NEGATIVE

## 2023-01-09 LAB — T4, FREE: Free T4: 1.01 ng/dL (ref 0.82–1.77)

## 2023-01-09 LAB — SEDIMENTATION RATE: Sed Rate: 4 mm/hr (ref 0–32)

## 2023-01-09 LAB — TSH: TSH: 0.381 u[IU]/mL — ABNORMAL LOW (ref 0.450–4.500)

## 2023-01-09 LAB — C-REACTIVE PROTEIN: CRP: <1 mg/L (ref 0–10)

## 2023-02-01 ENCOUNTER — Encounter: Payer: Self-pay | Admitting: Cardiology

## 2023-02-01 NOTE — Progress Notes (Signed)
Cardiology Office Note  Date: 02/02/2023   ID: Debra [name], DOB 14-Jun-1991, MRN [id number]  PCP: Janora Norlander, DO  Chief Complaint:  Chief Complaint  Patient presents with   Shortness of Breath   History of Present Illness: Debra [name] is a 32 y.o. female referred for cardiology consultation by Dr. Lajuana Ripple for evaluation of dyspnea on exertion.  She was evaluated in the ER in February for similar symptoms, workup was unrevealing at that time.  I reviewed her recent lab work, chest x-ray and ECG results.  She states that over the last month she has felt intermittent shortness of breath with activity, also a feeling of chest tightness.  This does not happen consistently.  No associated sense of palpitations, no syncope.  Her 74-year-old daughter had had pneumonia around the time that she was seen in the ER, but she did not clearly have any signs of infection at that point.  Does report intermittent cough in the setting of tobacco use.  No history of asthma.  She has a history of peripartum hypertension, no other obvious complications such as cardiomyopathy.  I reviewed her medications.  She is on Synthroid per PCP, recent TSH 0.381 with plan to monitor for now, T4 normal range.  BNP was noted to be normal and she is not anemic.  Orthostatic vital signs were obtained today and found to be normal.  Supine blood pressure 105/72 with heart rate 76, seated blood pressure 104/73 with heart rate 80, and standing blood pressure 115/79 with heart rate 96.  Not diagnostic of POTS.  Review of Systems: As outlined in the history of present illness.  No orthopnea or PND, no leg swelling.  Past Medical History: Past Medical History:  Diagnosis Date   Contraceptive management    Hypothyroidism    Iron deficiency anemia    Postpartum hypertension 2018   Past Surgical History: Past Surgical History:  Procedure Laterality Date   HERNIA REPAIR Bilateral 1998   WISDOM TOOTH  EXTRACTION     Family History: Family History  Problem Relation Age of Onset   Healthy Mother    Squamous cell carcinoma Father        upper airway/ oropharynx. required extensive resection 2020   COPD Father    Asthma Brother    Other Maternal Grandmother        MVA   Other Maternal Grandfather        fibrosis of lungs   Cancer Paternal Grandmother    COPD Paternal Grandmother    Cancer Paternal Grandfather        lung    Heart attack Maternal Uncle    Social History:  Social History   Tobacco Use   Smoking status: Every Day    Packs/day: 1.00    Years: 14.00    Additional pack years: 0.00    Total pack years: 14.00    Types: Cigarettes   Smokeless tobacco: Never  Substance Use Topics   Alcohol use: Yes    Comment: To excess   Medications: Current Outpatient Medications on File Prior to Visit  Medication Sig Dispense Refill   ferrous sulfate (SLOW RELEASE IRON) 160 (50 Fe) MG TBCR SR tablet Take 1 tablet (160 mg total) by mouth daily. 90 tablet 3   ibuprofen (ADVIL) 600 MG tablet Take 1 tablet (600 mg total) by mouth every 8 (eight) hours as needed. 30 tablet 0   levothyroxine (SYNTHROID) 50 MCG tablet Take 1 tablet (50 mcg total) by  mouth daily. 90 tablet 3   varenicline (CHANTIX CONTINUING MONTH PAK) 1 MG tablet Take 1 tablet (1 mg total) by mouth 2 (two) times daily. Continue for 1 month AFTER stopping smoking. Then can stop 60 tablet 4   No current facility-administered medications on file prior to visit.   Allergies: Allergies  Allergen Reactions   Bee Venom Anaphylaxis and Hives   Physical Exam: VS:  BP 116/78   Pulse 98   Ht 5\' 9"  (1.753 m)   Wt 119 lb (54 kg)   LMP 12/07/2022 Comment: shielded  SpO2 99%   BMI 17.57 kg/m , BMI Body mass index is 17.57 kg/m.  Wt Readings from Last 3 Encounters:  02/02/23 119 lb (54 kg)  01/08/23 123 lb (55.8 kg)  01/05/23 124 lb (56.2 kg)    General: Tall, slender young woman in no distress. HEENT: Conjunctiva  and lids normal. Neck: Supple, no elevated JVP or carotid bruits. Lungs: Clear to auscultation, nonlabored breathing at rest. Cardiac: Regular rate and rhythm, soft S4, no S3 or significant systolic murmur, no midsystolic click, no pericardial rub. Abdomen: Bowel sounds present. Extremities: No pitting edema, distal pulses 2+. Skin: Warm and dry. Musculoskeletal: No kyphosis. Neuropsychiatric: Alert and oriented x3, affect grossly appropriate.  ECG:  An ECG dated 01/05/2023 was personally reviewed today and demonstrated:  Sinus rhythm with possible left atrial enlargement, rightward axis, nonspecific T wave changes  Labwork: 09/08/2022: ALT 30; AST 50 01/05/2023: BUN 6; Creatinine, Ser 0.68; Hemoglobin 12.5; Platelets 198; Potassium 3.6; Sodium 135 01/08/2023: BNP 19.4; TSH 0.381     Component Value Date/Time   CHOL 126 09/08/2022 1042   TRIG 157 (H) 09/08/2022 1042   HDL 61 09/08/2022 1042   CHOLHDL 2.1 09/08/2022 1042   LDLCALC 39 09/08/2022 1042   Other Studies Reviewed Today:  Chest x-ray 01/05/2023: FINDINGS: Lungs are clear.  No pleural effusion or pneumothorax.   The heart is normal in size.   Visualized osseous structures are within normal limits.   IMPRESSION: Normal chest radiographs.  Assessment and Plan:  1.  Intermittent dyspnea on exertion and sense of chest tightness.  Chest x-ray without acute findings with symptoms in February.  BNP normal.  She is not hypoxic today and lungs are clear.  ECG shows nonspecific changes overall.  Likelihood of ischemic etiology remains low, however I would recommend assessing cardiac structure and function with an echocardiogram.  Orthostatic vital signs were not diagnostic of POTS.  She does have tobacco use history, smoking cessation would be recommended.  Also attention to regular hydration in general given low normal blood pressure at baseline.  2.  History of hypothyroidism, currently on Synthroid.  Recent TSH 0.381 with normal  T4.  Following with Dr. Lajuana Ripple, she may ultimately need adjustment in therapy.  Disposition:  Follow up  test results.  Signed, Satira Sark, M.D., F.A.C.C.

## 2023-02-02 ENCOUNTER — Ambulatory Visit: Payer: Medicaid Other | Attending: Cardiology | Admitting: Cardiology

## 2023-02-02 ENCOUNTER — Encounter: Payer: Self-pay | Admitting: Cardiology

## 2023-02-02 VITALS — BP 116/78 | HR 98 | Ht 69.0 in | Wt 119.0 lb

## 2023-02-02 DIAGNOSIS — R0602 Shortness of breath: Secondary | ICD-10-CM

## 2023-02-02 DIAGNOSIS — R0609 Other forms of dyspnea: Secondary | ICD-10-CM | POA: Diagnosis not present

## 2023-02-02 NOTE — Patient Instructions (Signed)
Medication Instructions:   Your physician recommends that you continue on your current medications as directed. Please refer to the Current Medication list given to you today.  Labwork: None today  Testing/Procedures: Your physician has requested that you have an echocardiogram. Echocardiography is a painless test that uses sound waves to create images of your heart. It provides your doctor with information about the size and shape of your heart and how well your heart's chambers and valves are working. This procedure takes approximately one hour. There are no restrictions for this procedure. Please do NOT wear cologne, perfume, aftershave, or lotions (deodorant is allowed). Please arrive 15 minutes prior to your appointment time.   Follow-Up: We will call you with results  Any Other Special Instructions Will Be Listed Below (If Applicable).  If you need a refill on your cardiac medications before your next appointment, please call your pharmacy.  

## 2023-02-05 ENCOUNTER — Ambulatory Visit (INDEPENDENT_AMBULATORY_CARE_PROVIDER_SITE_OTHER): Payer: Medicaid Other | Admitting: Family Medicine

## 2023-02-05 ENCOUNTER — Encounter: Payer: Self-pay | Admitting: Family Medicine

## 2023-02-05 VITALS — BP 120/85 | HR 85 | Temp 98.7°F | Ht 69.0 in | Wt 119.0 lb

## 2023-02-05 DIAGNOSIS — R0609 Other forms of dyspnea: Secondary | ICD-10-CM | POA: Diagnosis not present

## 2023-02-05 DIAGNOSIS — F1010 Alcohol abuse, uncomplicated: Secondary | ICD-10-CM | POA: Diagnosis not present

## 2023-02-05 DIAGNOSIS — J3489 Other specified disorders of nose and nasal sinuses: Secondary | ICD-10-CM

## 2023-02-05 DIAGNOSIS — E039 Hypothyroidism, unspecified: Secondary | ICD-10-CM

## 2023-02-05 DIAGNOSIS — F321 Major depressive disorder, single episode, moderate: Secondary | ICD-10-CM | POA: Diagnosis not present

## 2023-02-05 DIAGNOSIS — F4329 Adjustment disorder with other symptoms: Secondary | ICD-10-CM | POA: Diagnosis not present

## 2023-02-05 MED ORDER — AZELASTINE HCL 0.1 % NA SOLN: 1.0000 | Freq: Two times a day (BID) | NASAL | 12 refills | Status: AC

## 2023-02-05 NOTE — Progress Notes (Signed)
Subjective: CC:DOE PCP: Janora Norlander, DO VO:[id number] Debra [name] is a 32 y.o. female presenting to clinic today for:  1. DOE/ tobacco use d/o She saw Dr Domenic Polite about 3 days ago.  She has echo scheduled for April.  She still gets occasional heart palpitations and shortness of breath but nothing like she had previously.  No hemoptysis, unplanned weight loss.  She continues to smoke about half a pack per day.  She never did pick up the Chantix because she is holding off at the behest of family members who had concerns about her utilizing this to stop smoking.  I think that she should just quit on her own.  2. Alcohol use d/o, complicated grief/ MDD She continues to drink but drastically less than previous.  On average she drinks about 3 beers every 2 days and on the weekends she does drink a bit more because she does not really have a schedule to keep.  She has been trying to find some other activities during the weekend so that she is a less prone to drinking.  She is going to [place] with her child and mother soon and she is really looking forward to this trip.  They will be there about a week at [facility].  3.  Allergic rhinitis Patient reports that she has been having some rhinorrhea.  The seasonal change seems to be the trigger.  No fevers reported.  She feels like she has drainage that causes her to need to clear her throat.  Is very reluctant to take any oral medications but would be willing to trial a nasal spray.   ROS: Per HPI  Allergies  Allergen Reactions   Bee Venom Anaphylaxis and Hives   Past Medical History:  Diagnosis Date   Contraceptive management    Hypothyroidism    Iron deficiency anemia    Postpartum hypertension 2018    Current Outpatient Medications:    ferrous sulfate (SLOW RELEASE IRON) 160 (50 Fe) MG TBCR SR tablet, Take 1 tablet (160 mg total) by mouth daily., Disp: 90 tablet, Rfl: 3   ibuprofen (ADVIL) 600 MG tablet, Take 1 tablet (600 mg total)  by mouth every 8 (eight) hours as needed., Disp: 30 tablet, Rfl: 0   levothyroxine (SYNTHROID) 50 MCG tablet, Take 1 tablet (50 mcg total) by mouth daily., Disp: 90 tablet, Rfl: 3   varenicline (CHANTIX CONTINUING MONTH PAK) 1 MG tablet, Take 1 tablet (1 mg total) by mouth 2 (two) times daily. Continue for 1 month AFTER stopping smoking. Then can stop, Disp: 60 tablet, Rfl: 4 Social History   Socioeconomic History   Marital status: Single    Spouse name: Not on file   Number of children: 1   Years of education: Not on file   Highest education level: Not on file  Occupational History   Not on file  Tobacco Use   Smoking status: Every Day    Packs/day: 1.00    Years: 14.00    Additional pack years: 0.00    Total pack years: 14.00    Types: Cigarettes   Smokeless tobacco: Never  Vaping Use   Vaping Use: Never used  Substance and Sexual Activity   Alcohol use: Yes    Comment: To excess   Drug use: No   Sexual activity: Yes    Birth control/protection: None  Other Topics Concern   Not on file  Social History Narrative   Married. Has 1 living child that is 4 years  old.    Works from home   Smokes 1 ppd since age 89.   Social Determinants of Health   Financial Resource Strain: Low Risk  (08/17/2020)   Overall Financial Resource Strain (CARDIA)    Difficulty of Paying Living Expenses: Not hard at all  Food Insecurity: No Food Insecurity (08/17/2020)   Hunger Vital Sign    Worried About Running Out of Food in the Last Year: Never true    Ran Out of Food in the Last Year: Never true  Transportation Needs: No Transportation Needs (08/17/2020)   PRAPARE - Hydrologist (Medical): No    Lack of Transportation (Non-Medical): No  Physical Activity: Sufficiently Active (08/17/2020)   Exercise Vital Sign    Days of Exercise per Week: 7 days    Minutes of Exercise per Session: 90 min  Stress: No Stress Concern Present (08/17/2020)   [place]    Feeling of Stress : Only a little  Social Connections: Moderately Isolated (08/17/2020)   Social Connection and Isolation Panel [NHANES]    Frequency of Communication with Friends and Family: More than three times a week    Frequency of Social Gatherings with Friends and Family: Twice a week    Attends Religious Services: More than 4 times per year    Active Member of [employer] or Organizations: No    Attends Archivist Meetings: Never    Marital Status: Never married  Intimate Partner Violence: Not At Risk (08/17/2020)   Humiliation, Afraid, Rape, and Kick questionnaire    Fear of Current or Ex-Partner: No    Emotionally Abused: No    Physically Abused: No    Sexually Abused: No   Family History  Problem Relation Age of Onset   Healthy Mother    Squamous cell carcinoma Father        upper airway/ oropharynx. required extensive resection 2020   COPD Father    Asthma Brother    Other Maternal Grandmother        MVA   Other Maternal Grandfather        fibrosis of lungs   Cancer Paternal Grandmother    COPD Paternal Grandmother    Cancer Paternal Grandfather        lung    Heart attack Maternal Uncle     Objective: Office vital signs reviewed. BP 120/85   Pulse 85   Temp 98.7 F (37.1 C)   Ht 5\' 9"  (1.753 m)   Wt 119 lb (54 kg)   LMP 02/04/2023   SpO2 99%   BMI 17.57 kg/m   Physical Examination:  General: Awake, alert, thin nontoxic female, No acute distress HEENT: No exophthalmos.  No goiter Cardio: regular rate and rhythm, S1S2 heard, no murmurs appreciated Pulm: clear to auscultation bilaterally, no wheezes, rhonchi or rales; normal work of breathing on room air Extremities: warm, well perfused, No edema, cyanosis or clubbing; +2 pulses bilaterally MSK: normal gait and station Skin: dry; intact; no rashes or lesions Neuro: No tremor Psych: Mood stable, speech normal, affect appropriate.  Very  pleasant, interactive     02/05/2023    9:48 AM 01/08/2023    3:17 PM 05/01/2022    2:11 PM  Depression screen PHQ 2/9  Decreased Interest 0 1 0  Down, Depressed, Hopeless 0 0 0  PHQ - 2 Score 0 1 0  Altered sleeping 0 1   Tired, decreased energy  0 0   Change in appetite 0 2   Feeling bad or failure about yourself  0 0   Trouble concentrating 0 0   Moving slowly or fidgety/restless 0 0   Suicidal thoughts 0 0   PHQ-9 Score 0 4   Difficult doing work/chores Not difficult at all Somewhat difficult       02/05/2023    9:48 AM 01/08/2023    3:18 PM 12/08/2022    2:28 PM 05/01/2022    2:11 PM  GAD 7 : Generalized Anxiety Score  Nervous, Anxious, on Edge 0  0 0  Control/stop worrying 0 0 1 0  Worry too much - different things 0 0 0 0  Trouble relaxing 0 3 0 0  Restless 0 1 1 0  Easily annoyed or irritable 0 0 0 0  Afraid - awful might happen 0 0 0 0  Total GAD 7 Score 0  2 0  Anxiety Difficulty Not difficult at all Somewhat difficult Not difficult at all Not difficult at all      Assessment/ Plan: 32 y.o. female   Depression, major, single episode, moderate (HCC)  Prolonged grief reaction  Excessive drinking alcohol  Dyspnea on exertion  Acquired hypothyroidism - Plan: TSH, T4, Free  Rhinorrhea - Plan: azelastine (ASTELIN) 0.1 % nasal spray  Mood is chronic and stable.  She is still struggling with alcohol overuse but has reduced her consumption quite a bit.  I would like to follow-up again on this in about 3 months  Uncertain etiology of dyspnea on exertion but I agree that maybe it is related to the after mentioned.  They are going to rule out any structural abnormalities contributing with echocardiogram soon.  Would like to recheck her thyroid levels given abnormalities noted on last visit we will adjust her prescription accordingly if needed  For the rhinorrhea, Astelin nasal spray prescribed.  Follow-up as needed on that issue  No orders of the defined types were  placed in this encounter.  No orders of the defined types were placed in this encounter.    Janora Norlander, DO [place] [phone]

## 2023-02-06 ENCOUNTER — Encounter: Payer: Self-pay | Admitting: Family Medicine

## 2023-02-06 LAB — T4, FREE: Free T4: 0.86 ng/dL (ref 0.82–1.77)

## 2023-02-06 LAB — TSH: TSH: 0.736 u[IU]/mL (ref 0.450–4.500)

## 2023-03-06 ENCOUNTER — Ambulatory Visit (HOSPITAL_COMMUNITY)
Admission: RE | Admit: 2023-03-06 | Discharge: 2023-03-06 | Disposition: A | Discharge status: Home / Self Care | Payer: Medicaid Other | Source: Ambulatory Visit | Attending: Cardiology | Admitting: Cardiology

## 2023-03-06 DIAGNOSIS — R0602 Shortness of breath: Secondary | ICD-10-CM

## 2023-03-06 LAB — ECHOCARDIOGRAM COMPLETE
Area-P 1/2: 3.01 cm2
Calc EF: 59.5 %
MV VTI: 2.17 cm2
S' Lateral: 3 cm
Single Plane A2C EF: 59.5 %
Single Plane A4C EF: 59.5 %

## 2023-03-06 NOTE — Progress Notes (Signed)
  Echocardiogram 2D Echocardiogram has been performed.  Milda Smart 03/06/2023, 2:47 PM

## 2023-04-24 ENCOUNTER — Encounter: Payer: Self-pay | Admitting: Family Medicine

## 2023-04-24 DIAGNOSIS — R197 Diarrhea, unspecified: Secondary | ICD-10-CM

## 2023-04-25 NOTE — Telephone Encounter (Signed)
Please see the MyChart message reply(ies) for my assessment and plan.    This patient gave consent for this Medical Advice Message and is aware that it may result in a bill to their insurance company, as well as the possibility of receiving a bill for a co-payment or deductible. They are an established patient, but are not seeking medical advice exclusively about a problem treated during an in person or video visit in the last seven days. I did not recommend an in person or video visit within seven days of my reply.    I spent a total of 6 minutes cumulative time within 7 days through MyChart messaging.  Nason Conradt, DO   

## 2023-05-10 ENCOUNTER — Encounter: Payer: Self-pay | Admitting: Family

## 2023-05-10 ENCOUNTER — Ambulatory Visit (INDEPENDENT_AMBULATORY_CARE_PROVIDER_SITE_OTHER): Payer: Medicaid Other | Admitting: Family

## 2023-05-10 ENCOUNTER — Ambulatory Visit (INDEPENDENT_AMBULATORY_CARE_PROVIDER_SITE_OTHER): Payer: Medicaid Other

## 2023-05-10 VITALS — BP 119/80 | HR 87 | Temp 98.0°F | Ht 69.0 in | Wt 115.6 lb

## 2023-05-10 DIAGNOSIS — M533 Sacrococcygeal disorders, not elsewhere classified: Secondary | ICD-10-CM

## 2023-05-10 DIAGNOSIS — Y9351 Activity, roller skating (inline) and skateboarding: Secondary | ICD-10-CM

## 2023-05-10 MED ORDER — BACLOFEN 10 MG PO TABS: 10.0000 mg | ORAL_TABLET | Freq: Three times a day (TID) | ORAL | 0 refills | Status: AC

## 2023-05-10 MED ORDER — DICLOFENAC SODIUM 75 MG PO TBEC: 75.0000 mg | DELAYED_RELEASE_TABLET | Freq: Two times a day (BID) | ORAL | 0 refills | Status: AC

## 2023-05-10 NOTE — Patient Instructions (Signed)
Tailbone Injury  The tailbone is the small bone at the lower end of the spine. The tailbone is also called the coccyx.A tailbone injury may involve stretched ligaments, bruising, or a broken bone/break (fracture). Tailbone injuries can be painful, and some may take a long time to heal. What are the causes? This condition may be caused by: Falling and landing on the tailbone. Repeated strain or friction from sitting for long periods of time. This may include actions such as rowing or bicycling. Childbirth. In some cases, the cause may not be known. What are the signs or symptoms? Symptoms of this condition include: Pain in the tailbone area or lower back, especially when sitting. Pain or difficulty when standing up from a sitting position. Bruising or swelling in the tailbone area. Painful bowel movements. In females, pain during sex. How is this diagnosed? This condition may be diagnosed based on your symptoms and a physical exam. If your health care provider suspects a fracture, you may have additional tests, such as: X-rays. CT scan. MRI. How is this treated? Most tailbone injuries heal on their own in 4-6 weeks. However, recovery time may be longer if there is a fracture. If treatment is needed, it may include: NSAIDs or other over-the-counter medicines to help relieve your pain. Rubber or inflated ring or cushion to take pressure off the tailbone when sitting. Physical therapy. Injection with local anesthesia and steroid medicine. This is done only if the pain does not improve over time with over-the-counter pain medicines. Follow these instructions at home: Activity Rest as told by your health care provider. Do not sit for a long time without moving. Get up to take short walks every 1-2 hours. This will improve blood flow and breathing. Ask for help if you feel weak or unsteady. Wear appropriate padding and sports gear when bicycling or rowing. This will prevent repeating an  injury that is caused by strain or friction. Do exercises as told by your health care provider. Increase your activity as the pain allows. Return to your normal activities as told by your health care provider. Ask your health care provider what activities are safe for you. Managing pain, stiffness, and swelling To help decrease discomfort when sitting: Sit on your rubber or inflated ring or cushion as told by your health care provider. Lean forward when you sit. If directed, put ice on the injured area. To do this: Put ice in a plastic bag. Place a towel between your skin and the bag. Leave the ice on for 20 minutes, 2-3 times per day for the first 1-2 days. If your skin turns bright red, remove the ice right away to prevent skin damage. The risk of skin damage is higher if you cannot feel pain, heat, or cold. If directed, apply heat to the affected area as often as told by your health care provider. Use the heat source that your health care provider recommends, such as a moist heat pack or a heating pad. Place a towel between your skin and the heat source. Leave the heat on for 20-30 minutes. If your skin turns bright red, remove the heat right away to prevent burns. The risk of burns is higher if you cannot feel pain, heat, or cold. General instructions Take over-the-counter and prescription medicines only as told by your health care provider. Your condition or medicine may cause constipation or painful bowel movements. To prevent or treat constipation, you may need to: Drink enough fluid to keep your urine pale yellow.   Take over-the-counter or prescription medicines. Eat foods that are high in fiber, such as beans, whole grains, and fresh fruits and vegetables. Limit foods that are high in fat and processed sugars, such as fried or sweet foods. Contact a health care provider if: Your pain becomes worse or is not controlled with medicine. Your bowel movements cause a great deal of  discomfort. You are unable to have a bowel movement after 4 days. You have pain during sex. Summary A tailbone injury may involve stretched ligaments, bruising, or a broken bone/break (fracture). Tailbone injuries can be painful. Most heal on their own in 4-6 weeks. Treatment may include taking NSAIDs, doing physical therapy, or using a rubber or inflated ring or cushion when sitting. Follow any recommendations from your health care provider to prevent or treat constipation. This information is not intended to replace advice given to you by your health care provider. Make sure you discuss any questions you have with your health care provider. Document Revised: 02/14/2022 Document Reviewed: 02/14/2022 Elsevier Patient Education  2024 Elsevier Inc.  

## 2023-05-10 NOTE — Progress Notes (Addendum)
Subjective:    Patient ID: Debra [name], female    DOB: [birth year], 32 y.o.   MRN: [id number]  Chief Complaint  Patient presents with   Fall    While skating Saturday and hurt tail bone    Pt presents to the office today with  coccyx pain. Reports she was roller skating on Saturday and fell on her buttocks. Reports aching pain 7 out 10. That is worse at night.  Fall The accident occurred 5 to 7 days ago. There was no blood loss. The point of impact was the buttocks. The pain is at a severity of 7/10. The pain is moderate. She has tried rest and NSAID for the symptoms. The treatment provided mild relief.      Review of Systems  All other systems reviewed and are negative.      Objective:   Physical Exam Vitals reviewed.  Constitutional:      General: She is not in acute distress.    Appearance: She is well-developed.  HENT:     Head: Normocephalic and atraumatic.  Eyes:     Pupils: Pupils are equal, round, and reactive to light.  Neck:     Thyroid: No thyromegaly.  Cardiovascular:     Rate and Rhythm: Normal rate and regular rhythm.     Heart sounds: Normal heart sounds. No murmur heard. Pulmonary:     Effort: Pulmonary effort is normal. No respiratory distress.     Breath sounds: Normal breath sounds. No wheezing.  Abdominal:     General: Bowel sounds are normal. There is no distension.     Palpations: Abdomen is soft.     Tenderness: There is no abdominal tenderness.  Musculoskeletal:        General: No tenderness. Normal range of motion.     Cervical back: Normal range of motion and neck supple.  Skin:    General: Skin is warm and dry.          Comments: Bruising and tenderness present   Neurological:     Mental Status: She is alert and oriented to person, place, and time.     Cranial Nerves: No cranial nerve deficit.     Deep Tendon Reflexes: Reflexes are normal and symmetric.  Psychiatric:        Behavior: Behavior normal.        Thought Content:  Thought content normal.        Judgment: Judgment normal.     BP 119/80   Pulse 87   Temp 98 F (36.7 C) (Temporal)   Ht 5\' 9"  (1.753 m)   Wt 115 lb 9.6 oz (52.4 kg)   SpO2 99%   BMI 17.07 kg/m        Assessment & Plan:  Debra [name] comes in today with chief complaint of Fall (While skating Saturday and hurt tail bone )   Diagnosis and orders addressed:  1. Coccyx pain - DG Sacrum/Coccyx; Future - diclofenac (VOLTAREN) 75 MG EC tablet; Take 1 tablet (75 mg total) by mouth 2 (two) times daily.  Dispense: 60 tablet; Refill: 0 - baclofen (LIORESAL) 10 MG tablet; Take 1 tablet (10 mg total) by mouth 3 (three) times daily.  Dispense: 30 each; Refill: 0  2. Injury while roller skating - DG Sacrum/Coccyx; Future   Use donut pillow Avoid fall precautions  Start diclofenac BID  Baclofen TID prn  Follow up if symptoms worsen or do not improve    Debra Rodney, FNP

## 2023-05-14 ENCOUNTER — Emergency Department (HOSPITAL_BASED_OUTPATIENT_CLINIC_OR_DEPARTMENT_OTHER)
Admission: EM | Admit: 2023-05-14 | Discharge: 2023-05-14 | Disposition: A | Discharge status: Home / Self Care | Payer: Medicaid Other | Attending: Emergency Medicine | Admitting: Emergency Medicine

## 2023-05-14 ENCOUNTER — Other Ambulatory Visit: Payer: Self-pay

## 2023-05-14 ENCOUNTER — Emergency Department (HOSPITAL_BASED_OUTPATIENT_CLINIC_OR_DEPARTMENT_OTHER): Payer: Medicaid Other

## 2023-05-14 ENCOUNTER — Encounter: Payer: Self-pay | Admitting: Family Medicine

## 2023-05-14 ENCOUNTER — Encounter (HOSPITAL_BASED_OUTPATIENT_CLINIC_OR_DEPARTMENT_OTHER): Payer: Self-pay | Admitting: Emergency Medicine

## 2023-05-14 ENCOUNTER — Ambulatory Visit (INDEPENDENT_AMBULATORY_CARE_PROVIDER_SITE_OTHER): Payer: Medicaid Other | Admitting: Family Medicine

## 2023-05-14 VITALS — BP 109/73 | HR 132 | Temp 97.8°F | Resp 20 | Ht 69.0 in | Wt 110.0 lb

## 2023-05-14 DIAGNOSIS — E860 Dehydration: Secondary | ICD-10-CM

## 2023-05-14 DIAGNOSIS — R42 Dizziness and giddiness: Secondary | ICD-10-CM | POA: Diagnosis not present

## 2023-05-14 DIAGNOSIS — E039 Hypothyroidism, unspecified: Secondary | ICD-10-CM | POA: Diagnosis not present

## 2023-05-14 DIAGNOSIS — N12 Tubulo-interstitial nephritis, not specified as acute or chronic: Secondary | ICD-10-CM | POA: Diagnosis not present

## 2023-05-14 DIAGNOSIS — R000 Tachycardia, unspecified: Secondary | ICD-10-CM | POA: Diagnosis not present

## 2023-05-14 DIAGNOSIS — Z1152 Encounter for screening for COVID-19: Secondary | ICD-10-CM | POA: Diagnosis not present

## 2023-05-14 DIAGNOSIS — K760 Fatty (change of) liver, not elsewhere classified: Secondary | ICD-10-CM | POA: Diagnosis not present

## 2023-05-14 LAB — BASIC METABOLIC PANEL
Anion gap: 12 (ref 5–15)
BUN: 9 mg/dL (ref 6–20)
CO2: 21 mmol/L — ABNORMAL LOW (ref 22–32)
Calcium: 8.9 mg/dL (ref 8.9–10.3)
Chloride: 96 mmol/L — ABNORMAL LOW (ref 98–111)
Creatinine, Ser: 0.77 mg/dL (ref 0.44–1.00)
GFR, Estimated: >60 mL/min (ref >60)
Glucose, Bld: 105 mg/dL — ABNORMAL HIGH (ref 70–99)
Potassium: 3.4 mmol/L — ABNORMAL LOW (ref 3.5–5.1)
Sodium: 129 mmol/L — ABNORMAL LOW (ref 135–145)

## 2023-05-14 LAB — CBC
HCT: 36.5 % (ref 36.0–46.0)
Hemoglobin: 11.9 g/dL — ABNORMAL LOW (ref 12.0–15.0)
MCH: 30.4 pg (ref 26.0–34.0)
MCHC: 32.6 g/dL (ref 30.0–36.0)
MCV: 93.4 fL (ref 80.0–100.0)
Platelets: 113 10*3/uL — ABNORMAL LOW (ref 150–400)
RBC: 3.91 MIL/uL (ref 3.87–5.11)
RDW: 17.2 % — ABNORMAL HIGH (ref 11.5–15.5)
WBC: 12.1 10*3/uL — ABNORMAL HIGH (ref 4.0–10.5)
nRBC: 0 % (ref 0.0–0.2)

## 2023-05-14 LAB — HEPATIC FUNCTION PANEL
ALT: 21 U/L (ref 0–44)
AST: 46 U/L — ABNORMAL HIGH (ref 15–41)
Albumin: 3.2 g/dL — ABNORMAL LOW (ref 3.5–5.0)
Alkaline Phosphatase: 84 U/L (ref 38–126)
Bilirubin, Direct: 0.3 mg/dL — ABNORMAL HIGH (ref 0.0–0.2)
Indirect Bilirubin: 0.4 mg/dL (ref 0.3–0.9)
Total Bilirubin: 0.7 mg/dL (ref 0.3–1.2)
Total Protein: 6.5 g/dL (ref 6.5–8.1)

## 2023-05-14 LAB — URINALYSIS, W/ REFLEX TO CULTURE (INFECTION SUSPECTED)
Bilirubin Urine: NEGATIVE
Glucose, UA: NEGATIVE mg/dL
Ketones, ur: NEGATIVE mg/dL
Nitrite: NEGATIVE
Protein, ur: 100 mg/dL — AB
Specific Gravity, Urine: 1.011 (ref 1.005–1.030)
WBC, UA: >50 WBC/hpf (ref 0–5)
pH: 6.5 (ref 5.0–8.0)

## 2023-05-14 LAB — TROPONIN I (HIGH SENSITIVITY): Troponin I (High Sensitivity): 6 ng/L (ref <18)

## 2023-05-14 LAB — LACTIC ACID, PLASMA: Lactic Acid, Venous: 1.6 mmol/L (ref 0.5–1.9)

## 2023-05-14 LAB — SARS CORONAVIRUS 2 BY RT PCR: SARS Coronavirus 2 by RT PCR: NEGATIVE

## 2023-05-14 MED ORDER — SODIUM CHLORIDE 0.9 % IV SOLN
1.0000 g | Freq: Once | INTRAVENOUS | Status: AC
  Administered 2023-05-14: 1 g via INTRAVENOUS
  Filled 2023-05-14: qty 10

## 2023-05-14 MED ORDER — LACTATED RINGERS IV SOLN: INTRAVENOUS | Status: DC

## 2023-05-14 MED ORDER — LACTATED RINGERS IV BOLUS
1000.0000 mL | Freq: Once | INTRAVENOUS | Status: AC
  Administered 2023-05-14: 1000 mL via INTRAVENOUS

## 2023-05-14 MED ORDER — IOHEXOL 300 MG/ML  SOLN
100.0000 mL | Freq: Once | INTRAMUSCULAR | Status: AC | PRN
  Administered 2023-05-14: 75 mL via INTRAVENOUS

## 2023-05-14 MED ORDER — CEPHALEXIN 500 MG PO CAPS: 500.0000 mg | ORAL_CAPSULE | Freq: Four times a day (QID) | ORAL | 0 refills | Status: AC

## 2023-05-14 MED ORDER — ACETAMINOPHEN 500 MG PO TABS
1000.0000 mg | ORAL_TABLET | Freq: Once | ORAL | Status: AC
  Administered 2023-05-14: 1000 mg via ORAL
  Filled 2023-05-14: qty 2

## 2023-05-14 NOTE — ED Provider Notes (Signed)
EMERGENCY DEPARTMENT AT [facility] Provider Note   CSN: [id number] Arrival date & time: 05/14/23  1535     History  Chief Complaint  Patient presents with   Dizziness   Blurred Vision    Debra [name] is a 32 y.o. female.  Patient is a 32 year old female with a history of anemia and hypothyroidism on daily Synthroid who is presenting today with multiple complaints.  Patient reports that on Tuesday of last week she started having diarrhea which was usually about 2 episodes per day until Saturday.  She reports other than having the diarrhea she felt her normal self she was able to do all her normal activities and was feeling fine.  She took her daughter to the [employer] [employer] on Saturday and reports Saturday evening she started having chills and feeling generally unwell.  Since that time she has had headache, myalgias, fatigue, anorexia.  She reports having some minimal food on Saturday but has not eaten since because she has not had any appetite.  She has been drinking fluids but reports every time she gets up or tries to do anything she feels very shaky.  Today when she got up to walk in here she felt so shaky even her vision started getting blurred.  While she lays down she feels okay but just feels unwell.  She has had a pounding headache since the symptoms started on Saturday but denies significant neck pain.  She has not had any rashes.  She denies any cough, congestion, chest pain, abdominal pain or vomiting.  She reports getting fevers at night mostly highest temperature was 103 yesterday.  No known sick contacts.  She does not smoke cigarettes daily and drinks 4-5 alcoholic drinks per day but last drink was on Saturday because of feeling unwell.  When asked if she has withdrawal symptoms when she does not drink she reports she is not sure because she has not tried not drinking.  She denies any drug use or IV drug use.  Went to her PCP today because she was there to  have her routine appointment for her Synthroid and after they evaluated her they sent her here for further evaluation.  She denies at any time any confusion and her husband also confirms that she has had no altered mental status.  The history is provided by the patient and the spouse.  Dizziness      Home Medications Prior to Admission medications   Medication Sig Start Date End Date Taking? Authorizing Provider  cephALEXin (KEFLEX) 500 MG capsule Take 1 capsule (500 mg total) by mouth 4 (four) times daily for 7 days. 05/14/23 05/21/23 Yes [place] Searles, Alphonzo Lemmings, MD  azelastine (ASTELIN) 0.1 % nasal spray Place 1 spray into both nostrils 2 (two) times daily. 02/05/23   Raliegh Ip, DO  baclofen (LIORESAL) 10 MG tablet Take 1 tablet (10 mg total) by mouth 3 (three) times daily. 05/10/23   Junie Spencer, FNP  diclofenac (VOLTAREN) 75 MG EC tablet Take 1 tablet (75 mg total) by mouth 2 (two) times daily. 05/10/23   Jannifer Rodney A, FNP  ferrous sulfate (SLOW RELEASE IRON) 160 (50 Fe) MG TBCR SR tablet Take 1 tablet (160 mg total) by mouth daily. 12/11/22   Raliegh Ip, DO  ibuprofen (ADVIL) 600 MG tablet Take 1 tablet (600 mg total) by mouth every 8 (eight) hours as needed. 04/12/22   Daryll Drown, NP  levothyroxine (SYNTHROID) 50 MCG tablet Take 1 tablet (50  mcg total) by mouth daily. 09/11/22   Raliegh Ip, DO  varenicline (CHANTIX CONTINUING MONTH PAK) 1 MG tablet Take 1 tablet (1 mg total) by mouth 2 (two) times daily. Continue for 1 month AFTER stopping smoking. Then can stop 05/01/22   Raliegh Ip, DO      Allergies    Bee venom    Review of Systems   Review of Systems  Neurological:  Positive for dizziness.    Physical Exam Updated Vital Signs BP (!) 101/58   Pulse (!) 101   Temp 100 F (37.8 C)   Resp (!) 24   SpO2 97%  Physical Exam Vitals and nursing note reviewed.  Constitutional:      General: She is not in acute distress.    Appearance: She  is well-developed.  HENT:     Head: Normocephalic and atraumatic.     Mouth/Throat:     Mouth: Mucous membranes are dry.     Comments: White film over the tongue Eyes:     Pupils: Pupils are equal, round, and reactive to light.  Cardiovascular:     Rate and Rhythm: Regular rhythm. Tachycardia present.     Pulses: Normal pulses.     Heart sounds: Normal heart sounds. No murmur heard.    No friction rub.  Pulmonary:     Effort: Pulmonary effort is normal.     Breath sounds: Normal breath sounds. No wheezing or rales.  Abdominal:     General: Bowel sounds are normal. There is no distension.     Palpations: Abdomen is soft.     Tenderness: There is no abdominal tenderness. There is no guarding or rebound.  Musculoskeletal:        General: No tenderness. Normal range of motion.     Right lower leg: No edema.     Left lower leg: No edema.     Comments: No edema  Skin:    General: Skin is warm and dry.     Findings: No rash.  Neurological:     Mental Status: She is alert and oriented to person, place, and time. Mental status is at baseline.     Cranial Nerves: No cranial nerve deficit.  Psychiatric:        Mood and Affect: Mood normal.        Behavior: Behavior normal.     ED Results / Procedures / Treatments   Labs (all labs ordered are listed, but only abnormal results are displayed) Labs Reviewed  CBC - Abnormal; Notable for the following components:      Result Value   WBC 12.1 (*)    Hemoglobin 11.9 (*)    RDW 17.2 (*)    Platelets 113 (*)    All other components within normal limits  BASIC METABOLIC PANEL - Abnormal; Notable for the following components:   Sodium 129 (*)    Potassium 3.4 (*)    Chloride 96 (*)    CO2 21 (*)    Glucose, Bld 105 (*)    All other components within normal limits  URINALYSIS, W/ REFLEX TO CULTURE (INFECTION SUSPECTED) - Abnormal; Notable for the following components:   APPearance HAZY (*)    Hgb urine dipstick SMALL (*)    Protein,  ur 100 (*)    Leukocytes,Ua LARGE (*)    Bacteria, UA MANY (*)    All other components within normal limits  HEPATIC FUNCTION PANEL - Abnormal; Notable for the following components:  Albumin 3.2 (*)    AST 46 (*)    Bilirubin, Direct 0.3 (*)    All other components within normal limits  SARS CORONAVIRUS 2 BY RT PCR  URINE CULTURE  LACTIC ACID, PLASMA  TROPONIN I (HIGH SENSITIVITY)    EKG EKG Interpretation  Date/Time:  Monday May 14 2023 16:19:27 EDT Ventricular Rate:  139 PR Interval:  152 QRS Duration: 74 QT Interval:  268 QTC Calculation: 407 R Axis:   208 Text Interpretation: Sinus tachycardia Right atrial enlargement Right superior axis deviation Inferior infarct , age undetermined Anterior infarct , age undetermined ST & T wave abnormality, consider lateral ischemia When compared with ECG of 05-Jan-2023 10:33, PREVIOUS ECG IS PRESENT Confirmed by Gwyneth Sprout ([id number]) on 05/14/2023 5:21:45 PM  Radiology CT ABDOMEN PELVIS W CONTRAST  Result Date: 05/14/2023 CLINICAL DATA:  Dizziness and blurred vision with diarrhea and chills. EXAM: CT ABDOMEN AND PELVIS WITH CONTRAST TECHNIQUE: Multidetector CT imaging of the abdomen and pelvis was performed using the standard protocol following bolus administration of intravenous contrast. RADIATION DOSE REDUCTION: This exam was performed according to the departmental dose-optimization program which includes automated exposure control, adjustment of the mA and/or kV according to patient size and/or use of iterative reconstruction technique. CONTRAST:  75mL OMNIPAQUE IOHEXOL 300 MG/ML  SOLN COMPARISON:  January 04, 2018 FINDINGS: Lower chest: No acute abnormality. Hepatobiliary: There is diffuse fatty infiltration of the liver parenchyma. No focal liver abnormality is seen. No gallstones, gallbladder wall thickening, or biliary dilatation. Pancreas: Unremarkable. No pancreatic ductal dilatation or surrounding inflammatory changes. Spleen:  Normal in size without focal abnormality. Adrenals/Urinary Tract: Adrenal glands are unremarkable. Kidneys are normal in size, without renal calculi or hydronephrosis. Multiple areas of patchy, heterogeneous ill-defined low-attenuation are seen scattered throughout the parenchyma the right kidney. This is most prominent within the upper pole. Bladder is unremarkable. Stomach/Bowel: Stomach is within normal limits. The appendix is not clearly identified. No evidence of bowel wall thickening, distention, or inflammatory changes. Vascular/Lymphatic: No significant vascular findings are present. No enlarged abdominal or pelvic lymph nodes. Reproductive: The uterus is unremarkable. A 3.2 cm x 2.0 cm x 2.6 cm right adnexal cyst is noted. A 2.6 cm diameter simple cyst is also seen within the left adnexa. Other: No abdominal wall hernia or abnormality. No abdominopelvic ascites. Musculoskeletal: No acute or significant osseous findings. IMPRESSION: 1. Findings likely consistent with right-sided acute pyelonephritis. Correlation with urinalysis and follow-up to resolution is recommended to exclude the presence of an underlying neoplastic process. 2. Bilateral simple adnexal cysts, as described above. No follow-up imaging is recommended. This recommendation follows ACR consensus guidelines: White Paper of the ACR Incidental Findings Committee II on Adnexal Findings. J Am Coll Radiol 660 180 0435. 3. Hepatic steatosis. Electronically Signed   By: Aram Candela M.D.   On: 05/14/2023 19:26   DG Chest Port 1 View  Result Date: 05/14/2023 CLINICAL DATA:  Three day history of dizziness and blurred vision EXAM: PORTABLE CHEST 1 VIEW COMPARISON:  Radiograph dated 01/05/2019 FINDINGS: Normal lung volumes. Rounded faint radiodensity projects over the left mid chest. No pleural effusion or pneumothorax. The heart size and mediastinal contours are within normal limits. No acute osseous abnormality. IMPRESSION: 1. Rounded faint  radiodensity projects over the left mid chest, favored external to the patient and would be amenable to direct inspection. Consider repeat frontal and lateral chest radiographs with nipple markers. 2.  No focal consolidations. Electronically Signed   By: Agustin Cree M.D.   On:  05/14/2023 17:53    Procedures Procedures    Medications Ordered in ED Medications  lactated ringers infusion (0 mLs Intravenous Transfusing/Transfer 05/14/23 1954)  lactated ringers bolus 1,000 mL (0 mLs Intravenous Stopped 05/14/23 1830)  iohexol (OMNIPAQUE) 300 MG/ML solution 100 mL (75 mLs Intravenous Contrast Given 05/14/23 1915)  acetaminophen (TYLENOL) tablet 1,000 mg (1,000 mg Oral Given 05/14/23 2015)  cefTRIAXone (ROCEPHIN) 1 g in sodium chloride 0.9 % 100 mL IVPB (0 g Intravenous Stopped 05/14/23 2108)    ED Course/ Medical Decision Making/ A&P                             Medical Decision Making Amount and/or Complexity of Data Reviewed Independent Historian: spouse Labs: ordered. Decision-making details documented in ED Course. Radiology: ordered and independent interpretation performed. Decision-making details documented in ED Course. ECG/medicine tests: ordered and independent interpretation performed. Decision-making details documented in ED Course.  Risk OTC drugs. Prescription drug management.   Pt presenting today with a complaint that caries a high risk for morbidity and mortality.  Here today with infectious symptoms that have been present since Wednesday of last week.  Could be viral in nature as symptoms started on Tuesday or Wednesday with diarrhea and then progressed to fever, myalgias, headache.  However patient has had anorexia and now was presyncopal.  Patient has not been eating and is significantly tachycardic.  With walking in to the emergency room heart rate of 168 but does improve with rest to the low 100s.  She is awake and alert and has no altered mental status.  She is complaining of a  headache but does not have classic findings of bacterial meningitis as she has full range of motion in her neck has no rashes.  She does not use IV drugs or have other findings concerning for endocarditis.  Possibility of myocarditis or pericarditis.  I independently interpreted patient's EKG and today patient does have sinus tachycardia with a rate of 139 bpm with pronounced T wave inversion in inferior lateral leads which she did not have prior.  Patient is not complaining of symptoms suggestive of pneumonia she has benign abdominal exam.  Concern also for possible alcohol withdrawal as she last had a drink on Friday and usually drinks 4-5 drinks per day and has not gone any significant amount of time without it.  She is not hypertensive today is not altered or having signs of DTs.  Concern for dehydration, AKI, electrolyte abnormality, viral illness.  Will give IV fluids, labs are pending.  I independently interpreted patient's labs and UA today with concerns for infection with large leukocytes, greater then 50 white blood cells and many bacteria, troponin and lactate are both within normal limits, low suspicion at this time for myocarditis or pericarditis with a normal troponin, CBC with mild leukocytosis of 12 and mild thrombocytopenia with a platelet count of 113, BMP with hyponatremia with a sodium of 129 but otherwise normal renal function and anion gap normal at 12.  LFTs only slightly elevated AST of 46 but otherwise normal.  I have independently visualized and interpreted pt's images today.  Chest x-ray without acute findings.  Patient's heart rate improved after IV fluids.  Based on lab work concern for possible pyelonephritis which could have been caused by her diarrhea prior to that.  Will do a CT to ensure no evidence of obstructing stones or other causes.  9:20 PM  CT is consistent with  right-sided pyelonephritis.  No evidence of abscess or other acute abnormalities such as renal stone.  Urine  culture was done.  Patient was given IV Rocephin.  After 2 L of fluid patient is feeling much better.  She is able to eat she was able to stand and go to the bathroom reports feeling much better.  Still complaining of a mild headache had a fever of 100 here but is responding with Tylenol.  At this time feel that patient is stable for discharge home.  Will continue Keflex and follow urine cultures.  All these findings were discussed with the patient and her husband.  They are comfortable with this plan.  She was given return precautions.  No indication for admission at this time.         Final Clinical Impression(s) / ED Diagnoses Final diagnoses:  Pyelonephritis    Rx / DC Orders ED Discharge Orders          Ordered    cephALEXin (KEFLEX) 500 MG capsule  4 times daily        05/14/23 2116              Gwyneth Sprout, MD 05/14/23 2120

## 2023-05-14 NOTE — Discharge Instructions (Addendum)
You do have a kidney infection today.  You were given an antibiotic that will last until tomorrow and you can start the antibiotic that was sent to your pharmacy.  Make sure you are getting plenty of fluids and try to eat.  Get rest but within the next 2 to 3 days you should be feeling better.  If you start feeling worse have any confusion, vomiting, symptoms are not improving you should return to the emergency room.

## 2023-05-14 NOTE — ED Triage Notes (Signed)
Pt arrives to ED with c/o dizziness and blurred vision x3 days. She also notes diarrhea, chills over past week. She notes when she gets up and walk her symptoms start but when she lays down symptoms resolve.

## 2023-05-14 NOTE — ED Notes (Signed)
Patient ambulated around unit with no distress noted. No dizziness or shakiness noted. C/o ongoing headache.

## 2023-05-14 NOTE — Patient Instructions (Signed)
Dehydration, Adult Dehydration is a condition in which there is not enough water or other fluids in the body. This happens when a person loses more fluids than they take in. Important organs cannot work right without the right amount of fluids. Any loss of fluids from the body can cause dehydration. Dehydration can be mild, worse, or very bad. It should be treated right away to keep it from getting very bad. What are the causes? Conditions that cause loss of water in the body. They include: Watery poop (diarrhea). Vomiting. Sweating a lot. Fever. Infection. Peeing (urinating) a lot. Not drinking enough fluids. Certain medicines, such as medicines that take extra fluid out of the body (diuretics). Lack of safe drinking water. Not being able to get enough water and food. What increases the risk? Having a long-term (chronic) illness that has not been treated the right way, such as: Diabetes. Heart disease. Kidney disease. Being 65 years of age or older. Having a disability. Living in a place that is high above the ground or sea (high in altitude). The thinner, drier air causes more fluid loss. Doing exercises that put stress on your body for a long time. Being active when in hot places. What are the signs or symptoms? Symptoms of dehydration depend on how bad it is. Mild or worse dehydration Thirst. Dry lips or dry mouth. Feeling dizzy or light-headed. Muscle cramps. Passing little pee or dark pee. Pee may be the color of tea. Headache. Very bad dehydration Changes in skin. Skin may: Be cold to the touch (clammy). Be blotchy or pale. Not go back to normal right after you pinch it and let it go. Little or no tears, pee, or sweat. Fast breathing. Low blood pressure. Weak pulse. Pulse that is more than 100 beats a minute when you are sitting still. Other changes, such as: Feeling very thirsty. Eyes that look hollow (sunken). Cold hands and feet. Being confused. Being very  tired (lethargic) or having trouble waking from sleep. Losing weight. Loss of consciousness. How is this treated? Treatment for this condition depends on how bad your dehydration is. Treatment should start right away. Do not wait until your condition gets very bad. Very bad dehydration is an emergency. You will need to go to a hospital. Mild or worse dehydration can be treated at home. You may be asked to: Drink more fluids. Drink an oral rehydration solution (ORS). This drink gives you the right amount of fluids, salts, and minerals (electrolytes). Very bad dehydration can be treated: With fluids through an IV tube. By correcting low levels of electrolytes in the body. By treating the problem that caused your dehydration. Follow these instructions at home: Oral rehydration solution If told by your doctor, drink an ORS: Make an ORS. Use instructions on the package. Start by drinking small amounts, about  cup (120 mL) every 5-10 minutes. Slowly drink more until you have had the amount that your doctor said to have.  Eating and drinking  Drink enough clear fluid to keep your pee pale yellow. If you were told to drink an ORS, finish the ORS first. Then, start slowly drinking other clear fluids. Drink fluids such as: Water. Do not drink only water. Doing that can make the salt (sodium) level in your body get too low. Water from ice chips you suck on. Fruit juice that you have added water to (diluted). Low-calorie sports drinks. Eat foods that have the right amounts of salts and minerals, such as bananas, oranges, potatoes,   tomatoes, or spinach. Do not drink alcohol. Avoid drinks that have caffeine or sugar. These include:: High-calorie sports drinks. Fruit juice that you did not add water to. Soda. Coffee or energy drinks. Avoid foods that are greasy or have a lot of fat or sugar. General instructions Take over-the-counter and prescription medicines only as told by your doctor. Do  not take sodium tablets. Doing that can make the salt level in your body get too high. Return to your normal activities as told by your doctor. Ask your doctor what activities are safe for you. Keep all follow-up visits. Your doctor may check and change your treatment. Contact a doctor if: You have pain in your belly (abdomen) and the pain: Gets worse. Stays in one place. You have a rash. You have a stiff neck. You get angry or annoyed more easily than normal. You are more tired or have a harder time waking than normal. You feel weak or dizzy. You feel very thirsty. Get help right away if: You have any symptoms of very bad dehydration. You vomit every time you eat or drink. Your vomiting gets worse, does not go away, or you vomit blood or green stuff. You are getting treatment, but symptoms are getting worse. You have a fever. You have a very bad headache. You have: Diarrhea that gets worse or does not go away. Blood in your poop (stool). This may cause poop to look black and tarry. No pee in 6-8 hours. Only a small amount of pee in 6-8 hours, and the pee is very dark. You have trouble breathing. These symptoms may be an emergency. Get help right away. Call 911. Do not wait to see if the symptoms will go away. Do not drive yourself to the hospital. This information is not intended to replace advice given to you by your health care provider. Make sure you discuss any questions you have with your health care provider. Document Revised: 06/05/2022 Document Reviewed: 06/05/2022 Elsevier Patient Education  2024 [employer]  

## 2023-05-14 NOTE — Progress Notes (Signed)
Subjective: Debra [name], Thyroid disorder PCP: Raliegh Ip, DO [id number] Debra [name] is a 32 y.o. female presenting to clinic today for:  1.  GI illness Reports that she has been suffering from a GI illness for the last several days.  She had several episodes of diarrhea over the weekend.  She has had decreased appetite and she started getting dizzy and shaky recently.  She admits that she is not really eating well because of the stomach issues but she has been hydrating with water over the weekend.  No blood in stool.  No other sick contacts.   ROS: Per HPI  Allergies  Allergen Reactions   Bee Venom Anaphylaxis and Hives   Past Medical History:  Diagnosis Date   Contraceptive management    Hypothyroidism    Iron deficiency anemia    Postpartum hypertension 2018    Current Outpatient Medications:    azelastine (ASTELIN) 0.1 % nasal spray, Place 1 spray into both nostrils 2 (two) times daily. (Patient not taking: Reported on 05/10/2023), Disp: 30 mL, Rfl: 12   baclofen (LIORESAL) 10 MG tablet, Take 1 tablet (10 mg total) by mouth 3 (three) times daily., Disp: 30 each, Rfl: 0   diclofenac (VOLTAREN) 75 MG EC tablet, Take 1 tablet (75 mg total) by mouth 2 (two) times daily., Disp: 60 tablet, Rfl: 0   ferrous sulfate (SLOW RELEASE IRON) 160 (50 Fe) MG TBCR SR tablet, Take 1 tablet (160 mg total) by mouth daily., Disp: 90 tablet, Rfl: 3   ibuprofen (ADVIL) 600 MG tablet, Take 1 tablet (600 mg total) by mouth every 8 (eight) hours as needed. (Patient not taking: Reported on 05/10/2023), Disp: 30 tablet, Rfl: 0   levothyroxine (SYNTHROID) 50 MCG tablet, Take 1 tablet (50 mcg total) by mouth daily., Disp: 90 tablet, Rfl: 3   varenicline (CHANTIX CONTINUING MONTH PAK) 1 MG tablet, Take 1 tablet (1 mg total) by mouth 2 (two) times daily. Continue for 1 month AFTER stopping smoking. Then can stop (Patient not taking: Reported on 02/05/2023), Disp: 60 tablet, Rfl: 4 Social History    Socioeconomic History   Marital status: Single    Spouse name: Not on file   Number of children: 1   Years of education: Not on file   Highest education level: 12th grade  Occupational History   Not on file  Tobacco Use   Smoking status: Every Day    Packs/day: 1.00    Years: 14.00    Additional pack years: 0.00    Total pack years: 14.00    Types: Cigarettes   Smokeless tobacco: Never  Vaping Use   Vaping Use: Never used  Substance and Sexual Activity   Alcohol use: Yes    Comment: To excess   Drug use: No   Sexual activity: Yes    Birth control/protection: None  Other Topics Concern   Not on file  Social History Narrative   Married. Has 1 living child that is 46 years old.    Works from home   Smokes 1 ppd since age 5.   Social Determinants of Health   Financial Resource Strain: Medium Risk (05/10/2023)   Overall Financial Resource Strain (CARDIA)    Difficulty of Paying Living Expenses: Somewhat hard  Food Insecurity: Food Insecurity Present (05/10/2023)   Hunger Vital Sign    Worried About Running Out of Food in the Last Year: Sometimes true    Ran Out of Food in the Last Year: Sometimes true  Transportation  Needs: Unknown (05/10/2023)   PRAPARE - Administrator, Civil Service (Medical): No    Lack of Transportation (Non-Medical): Not on file  Physical Activity: Sufficiently Active (05/10/2023)   Exercise Vital Sign    Days of Exercise per Week: 2 days    Minutes of Exercise per Session: 80 min  Stress: No Stress Concern Present (05/10/2023)   [employer] of Occupational Health - Occupational Stress Questionnaire    Feeling of Stress : Only a little  Social Connections: Moderately Integrated (05/10/2023)   Social Connection and Isolation Panel [NHANES]    Frequency of Communication with Friends and Family: More than three times a week    Frequency of Social Gatherings with Friends and Family: Once a week    Attends Religious Services: More  than 4 times per year    Active Member of [employer] or Organizations: Yes    Attends Banker Meetings: More than 4 times per year    Marital Status: Never married  Intimate Partner Violence: Not At Risk (08/17/2020)   Humiliation, Afraid, Rape, and Kick questionnaire    Fear of Current or Ex-Partner: No    Emotionally Abused: No    Physically Abused: No    Sexually Abused: No   Family History  Problem Relation Age of Onset   Healthy Mother    Squamous cell carcinoma Father        upper airway/ oropharynx. required extensive resection 2020   COPD Father    Asthma Brother    Other Maternal Grandmother        MVA   Other Maternal Grandfather        fibrosis of lungs   Cancer Paternal Grandmother    COPD Paternal Grandmother    Cancer Paternal Grandfather        lung    Heart attack Maternal Uncle     Objective: Office vital signs reviewed. BP 109/73   Pulse (!) 132   Temp 97.8 F (36.6 C) (Oral)   Resp 20   Ht 5\' 9"  (1.753 m)   Wt 110 lb (49.9 kg)   SpO2 96%   BMI 16.24 kg/m   Physical Examination:  General: Awake, alert, thin, No acute distress HEENT: tacky mucous membranes Cardio: tachycardic with regular rhythm. S1S2 heard, no murmurs appreciated Pulm: clear to auscultation bilaterally, no wheezes, rhonchi or rales; normal work of breathing on room air    Assessment/ Plan: 32 y.o. female   Dehydration, moderate  Orthostatic, tachycardic w/ 4lb weight loss.  Evidence of moderate dehydration.  We discussed seeking emergent care in the ER for IV fluid rehydration.  Will call down to the ER at [facility] to communicate concerns and needs   No orders of the defined types were placed in this encounter.  No orders of the defined types were placed in this encounter.    Raliegh Ip, DO [facility] [phone]

## 2023-05-15 ENCOUNTER — Telehealth: Payer: Self-pay

## 2023-05-15 ENCOUNTER — Encounter: Payer: Self-pay | Admitting: Family Medicine

## 2023-05-15 LAB — URINE CULTURE

## 2023-05-15 NOTE — Telephone Encounter (Signed)
*  Primary  PA request received for Diclofenac Sodium 75MG  dr tablets  PA has been DENIED due to:   Per your health plan's criteria, this drug is covered if you meet the following: One of the following: (1) You have failed one preferred drug as confirmed by claims history or submission of medical records. The preferred drugs: indomethacin capsule, meloxicam tablet, naproxen tablet, sulindac tablet, ketorolac tablet. (2) You cannot use one preferred drug (please specify contraindication or intolerance). The information provided does not show that you meet the criteria listed above. Please speak with your doctor about your choices.

## 2023-05-16 ENCOUNTER — Telehealth: Payer: Self-pay

## 2023-05-16 LAB — URINE CULTURE: Culture: >=100000 — AB

## 2023-05-16 NOTE — Transitions of Care (Post Inpatient/ED Visit) (Signed)
   05/16/2023  Name: Debra [name] MRN: [id number] DOB: 1991/01/21  Today's TOC FU Call Status: Today's TOC FU Call Status:: Successful TOC FU Call Competed TOC FU Call Complete Date: 05/16/23  Transition Care Management Follow-up Telephone Call Date of Discharge: 05/15/23 Discharge Facility: [facility] ([facility]) Type of Discharge: Emergency Department Reason for ED Visit: Other: (nepritis) How have you been since you were released from the hospital?: Better Any questions or concerns?: No  Items Reviewed: Did you receive and understand the discharge instructions provided?: Yes Medications obtained,verified, and reconciled?: Yes (Medications Reviewed) Any new allergies since your discharge?: No Dietary orders reviewed?: Yes Do you have support at home?: Yes People in Home: parent(s)  Medications Reviewed Today: Medications Reviewed Today     Reviewed by Karena Addison, LPN (Licensed Practical Nurse) on 05/16/23 at 0957  Med List Status: <None>   Medication Order Taking? Sig Documenting Provider Last Dose Status Informant  azelastine (ASTELIN) 0.1 % nasal spray [id number] No Place 1 spray into both nostrils 2 (two) times daily. Delynn Flavin M, DO Taking Active   baclofen (LIORESAL) 10 MG tablet [id number] No Take 1 tablet (10 mg total) by mouth 3 (three) times daily. Junie Spencer, FNP Taking Active   cephALEXin (KEFLEX) 500 MG capsule [id number]  Take 1 capsule (500 mg total) by mouth 4 (four) times daily for 7 days. Gwyneth Sprout, MD  Active   diclofenac (VOLTAREN) 75 MG EC tablet [id number] No Take 1 tablet (75 mg total) by mouth 2 (two) times daily. Junie Spencer, FNP Taking Active   ferrous sulfate (SLOW RELEASE IRON) 160 (50 Fe) MG TBCR SR tablet [id number] No Take 1 tablet (160 mg total) by mouth daily. Delynn Flavin M, DO Taking Active   ibuprofen (ADVIL) 600 MG tablet [id number] No Take 1 tablet (600 mg total) by mouth every 8 (eight) hours as needed.  Daryll Drown, NP Taking Active   levothyroxine (SYNTHROID) 50 MCG tablet [id number] No Take 1 tablet (50 mcg total) by mouth daily. Delynn Flavin M, DO Taking Active   varenicline (CHANTIX CONTINUING MONTH PAK) 1 MG tablet [id number] No Take 1 tablet (1 mg total) by mouth 2 (two) times daily. Continue for 1 month AFTER stopping smoking. Then can stop Raliegh Ip, DO Taking Active             Home Care and Equipment/Supplies: Were Home Health Services Ordered?: NA Any new equipment or medical supplies ordered?: NA  Functional Questionnaire: Do you need assistance with bathing/showering or dressing?: No Do you need assistance with meal preparation?: No Do you need assistance with eating?: No Do you have difficulty maintaining continence: No Do you need assistance with getting out of bed/getting out of a chair/moving?: No Do you have difficulty managing or taking your medications?: No  Follow up appointments reviewed: PCP Follow-up appointment confirmed?: NA Specialist Hospital Follow-up appointment confirmed?: NA Do you need transportation to your follow-up appointment?: No Do you understand care options if your condition(s) worsen?: Yes-patient verbalized understanding    SIGNATURE Karena Addison, LPN [facility] Nurse Health Advisor Direct Dial [phone]

## 2023-05-17 ENCOUNTER — Telehealth (HOSPITAL_BASED_OUTPATIENT_CLINIC_OR_DEPARTMENT_OTHER): Payer: Self-pay | Admitting: *Deleted

## 2023-05-17 NOTE — Telephone Encounter (Signed)
Post ED Visit - Positive Culture Follow-up  Culture report reviewed by antimicrobial stewardship pharmacist: [facility] []  Enzo Bi, Pharm.D. []  Celedonio Miyamoto, [address].D., BCPS AQ-ID []  Garvin Fila, Pharm.D., BCPS []  Georgina Pillion, Pharm.D., BCPS []  [place], [address].D., BCPS, AAHIVP []  Estella Husk, Pharm.D., BCPS, AAHIVP []  Lysle Pearl, PharmD, BCPS []  Phillips Climes, PharmD, BCPS []  Agapito Games, PharmD, BCPS []  Verlan Friends, PharmD []  Mervyn Gay, PharmD, BCPS [x]   Doran Heater, PharmD  [facility] []  Len Childs, PharmD []  Greer Pickerel, PharmD []  Adalberto Cole, PharmD []  Perlie Gold, Rph []  Lonell Face) Jean Rosenthal, PharmD []  Earl Many, PharmD []  Junita Push, PharmD []  Dorna Leitz, PharmD []  Terrilee Files, PharmD []  Lynann Beaver, PharmD []  Keturah Barre, PharmD []  Loralee Pacas, PharmD []  Bernadene Person, PharmD   Positive urine culture Treated with Cephalexin, organism sensitive to the same and no further patient follow-up is required at this time.  Debra [name] 05/17/2023, 7:50 AM

## 2023-05-29 ENCOUNTER — Ambulatory Visit (INDEPENDENT_AMBULATORY_CARE_PROVIDER_SITE_OTHER): Payer: Medicaid Other | Admitting: Family Medicine

## 2023-05-29 ENCOUNTER — Encounter: Payer: Self-pay | Admitting: Family Medicine

## 2023-05-29 VITALS — BP 103/72 | HR 77 | Temp 98.5°F | Ht 69.0 in | Wt 120.0 lb

## 2023-05-29 DIAGNOSIS — D509 Iron deficiency anemia, unspecified: Secondary | ICD-10-CM | POA: Diagnosis not present

## 2023-05-29 DIAGNOSIS — E039 Hypothyroidism, unspecified: Secondary | ICD-10-CM

## 2023-05-29 DIAGNOSIS — G479 Sleep disorder, unspecified: Secondary | ICD-10-CM

## 2023-05-29 DIAGNOSIS — Z87440 Personal history of urinary (tract) infections: Secondary | ICD-10-CM | POA: Diagnosis not present

## 2023-05-29 DIAGNOSIS — Z91030 Bee allergy status: Secondary | ICD-10-CM

## 2023-05-29 DIAGNOSIS — F411 Generalized anxiety disorder: Secondary | ICD-10-CM

## 2023-05-29 DIAGNOSIS — F1021 Alcohol dependence, in remission: Secondary | ICD-10-CM | POA: Diagnosis not present

## 2023-05-29 LAB — URINALYSIS, ROUTINE W REFLEX MICROSCOPIC
Bilirubin, UA: NEGATIVE
Glucose, UA: NEGATIVE
Ketones, UA: NEGATIVE
Leukocytes,UA: NEGATIVE
Nitrite, UA: NEGATIVE
Protein,UA: NEGATIVE
RBC, UA: NEGATIVE
Specific Gravity, UA: 1.025 (ref 1.005–1.030)
Urobilinogen, Ur: 0.2 mg/dL (ref 0.2–1.0)
pH, UA: 7 (ref 5.0–7.5)

## 2023-05-29 MED ORDER — EPINEPHRINE 0.3 MG/0.3ML IJ SOAJ
0.30 mg | INTRAMUSCULAR | 0 refills | Status: AC | PRN
Start: 2023-05-29 — End: ?

## 2023-05-29 MED ORDER — MIRTAZAPINE 7.5 MG PO TABS
7.50 mg | ORAL_TABLET | Freq: Every day | ORAL | 0 refills | Status: AC
Start: 2023-05-29 — End: ?

## 2023-05-29 NOTE — Progress Notes (Unsigned)
Subjective: CC:*** PCP: Raliegh Ip, DO [id number] Romack is a 32 y.o. female presenting to clinic today for:  1. ***   ROS: Per HPI  Allergies  Allergen Reactions   Bee Venom Anaphylaxis and Hives   Past Medical History:  Diagnosis Date   Contraceptive management    Hypothyroidism    Iron deficiency anemia    Postpartum hypertension 2018    Current Outpatient Medications:    azelastine (ASTELIN) 0.1 % nasal spray, Place 1 spray into both nostrils 2 (two) times daily., Disp: 30 mL, Rfl: 12   baclofen (LIORESAL) 10 MG tablet, Take 1 tablet (10 mg total) by mouth 3 (three) times daily., Disp: 30 each, Rfl: 0   diclofenac (VOLTAREN) 75 MG EC tablet, Take 1 tablet (75 mg total) by mouth 2 (two) times daily., Disp: 60 tablet, Rfl: 0   ferrous sulfate (SLOW RELEASE IRON) 160 (50 Fe) MG TBCR SR tablet, Take 1 tablet (160 mg total) by mouth daily., Disp: 90 tablet, Rfl: 3   ibuprofen (ADVIL) 600 MG tablet, Take 1 tablet (600 mg total) by mouth every 8 (eight) hours as needed., Disp: 30 tablet, Rfl: 0   levothyroxine (SYNTHROID) 50 MCG tablet, Take 1 tablet (50 mcg total) by mouth daily., Disp: 90 tablet, Rfl: 3   varenicline (CHANTIX CONTINUING MONTH PAK) 1 MG tablet, Take 1 tablet (1 mg total) by mouth 2 (two) times daily. Continue for 1 month AFTER stopping smoking. Then can stop, Disp: 60 tablet, Rfl: 4 Social History   Socioeconomic History   Marital status: Single    Spouse name: Not on file   Number of children: 1   Years of education: Not on file   Highest education level: 12th grade  Occupational History   Not on file  Tobacco Use   Smoking status: Every Day    Packs/day: 1.00    Years: 14.00    Additional pack years: 0.00    Total pack years: 14.00    Types: Cigarettes   Smokeless tobacco: Never  Vaping Use   Vaping Use: Never used  Substance and Sexual Activity   Alcohol use: Yes    Comment: To excess   Drug use: No   Sexual activity: Yes     Birth control/protection: None  Other Topics Concern   Not on file  Social History Narrative   Married. Has 1 living child that is 35 years old.    Works from home   Smokes 1 ppd since age 76.   Social Determinants of Health   Financial Resource Strain: Medium Risk (05/10/2023)   Overall Financial Resource Strain (CARDIA)    Difficulty of Paying Living Expenses: Somewhat hard  Food Insecurity: Food Insecurity Present (05/10/2023)   Hunger Vital Sign    Worried About Running Out of Food in the Last Year: Sometimes true    Ran Out of Food in the Last Year: Sometimes true  Transportation Needs: Unknown (05/10/2023)   PRAPARE - Administrator, Civil Service (Medical): No    Lack of Transportation (Non-Medical): Not on file  Physical Activity: Sufficiently Active (05/10/2023)   Exercise Vital Sign    Days of Exercise per Week: 2 days    Minutes of Exercise per Session: 80 min  Stress: No Stress Concern Present (05/10/2023)   [employer] of Occupational Health - Occupational Stress Questionnaire    Feeling of Stress : Only a little  Social Connections: Moderately Integrated (05/10/2023)   Social Connection and  Isolation Panel [NHANES]    Frequency of Communication with Friends and Family: More than three times a week    Frequency of Social Gatherings with Friends and Family: Once a week    Attends Religious Services: More than 4 times per year    Active Member of [employer] or Organizations: Yes    Attends Banker Meetings: More than 4 times per year    Marital Status: Never married  Intimate Partner Violence: Not At Risk (08/17/2020)   Humiliation, Afraid, Rape, and Kick questionnaire    Fear of Current or Ex-Partner: No    Emotionally Abused: No    Physically Abused: No    Sexually Abused: No   Family History  Problem Relation Age of Onset   Healthy Mother    Squamous cell carcinoma Father        upper airway/ oropharynx. required extensive resection 2020    COPD Father    Asthma Brother    Other Maternal Grandmother        MVA   Other Maternal Grandfather        fibrosis of lungs   Cancer Paternal Grandmother    COPD Paternal Grandmother    Cancer Paternal Grandfather        lung    Heart attack Maternal Uncle     Objective: Office vital signs reviewed. BP 103/72   Pulse 77   Temp 98.5 F (36.9 C)   Ht 5\' 9"  (1.753 m)   Wt 120 lb (54.4 kg)   LMP 05/25/2023   SpO2 99%   BMI 17.72 kg/m   Physical Examination:  General: Awake, alert, *** nourished, No acute distress HEENT: Normal    Neck: No masses palpated. No lymphadenopathy    Ears: Tympanic membranes intact, normal light reflex, no erythema, no bulging    Eyes: PERRLA, extraocular membranes intact, sclera ***    Nose: nasal turbinates moist, *** nasal discharge    Throat: moist mucus membranes, no erythema, *** tonsillar exudate.  Airway is patent Cardio: regular rate and rhythm, S1S2 heard, no murmurs appreciated Pulm: clear to auscultation bilaterally, no wheezes, rhonchi or rales; normal work of breathing on room air GI: soft, non-tender, non-distended, bowel sounds present x4, no hepatomegaly, no splenomegaly, no masses GU: external vaginal tissue ***, cervix ***, *** punctate lesions on cervix appreciated, *** discharge from cervical os, *** bleeding, *** cervical motion tenderness, *** abdominal/ adnexal masses Extremities: warm, well perfused, No edema, cyanosis or clubbing; +*** pulses bilaterally MSK: *** gait and *** station Skin: dry; intact; no rashes or lesions Neuro: *** Strength and light touch sensation grossly intact, *** DTRs ***/4  Assessment/ Plan: 32 y.o. female   Recent urinary tract infection - Plan: Urinalysis, Routine w reflex microscopic, Urine Culture  Acquired hypothyroidism - Plan: TSH, T4, Free  Iron deficiency anemia, unspecified iron deficiency anemia type - Plan: CBC, Iron, TIBC and Ferritin Panel  History of alcohol dependence  (HCC) - Plan: CBC, Iron, TIBC and Ferritin Panel  Generalized anxiety disorder - Plan: mirtazapine (REMERON) 7.5 MG tablet  Sleep difficulties - Plan: mirtazapine (REMERON) 7.5 MG tablet  Bee allergy status - Plan: EPINEPHrine (EPIPEN 2-PAK) 0.3 mg/0.3 mL IJ SOAJ injection    No orders of the defined types were placed in this encounter.  No orders of the defined types were placed in this encounter.    Raliegh Ip, DO [facility] [phone]

## 2023-05-30 ENCOUNTER — Encounter: Payer: Self-pay | Admitting: Family Medicine

## 2023-05-30 ENCOUNTER — Other Ambulatory Visit: Payer: Self-pay | Admitting: Family Medicine

## 2023-05-30 DIAGNOSIS — D509 Iron deficiency anemia, unspecified: Secondary | ICD-10-CM

## 2023-05-30 LAB — CBC
Hematocrit: 33.1 % — ABNORMAL LOW (ref 34.0–46.6)
Hemoglobin: 10.4 g/dL — ABNORMAL LOW (ref 11.1–15.9)
MCH: 29.3 pg (ref 26.6–33.0)
MCHC: 31.4 g/dL — ABNORMAL LOW (ref 31.5–35.7)
MCV: 93 fL (ref 79–97)
Platelets: 386 10*3/uL (ref 150–450)
RBC: 3.55 x10E6/uL — ABNORMAL LOW (ref 3.77–5.28)
RDW: 15.9 % — ABNORMAL HIGH (ref 11.7–15.4)
WBC: 7.7 10*3/uL (ref 3.4–10.8)

## 2023-05-30 LAB — TSH: TSH: 0.8 u[IU]/mL (ref 0.450–4.500)

## 2023-05-30 LAB — IRON,TIBC AND FERRITIN PANEL
Ferritin: 45 ng/mL (ref 15–150)
Iron Saturation: 6 % — CL (ref 15–55)
Iron: 21 ug/dL — ABNORMAL LOW (ref 27–159)
Total Iron Binding Capacity: 378 ug/dL (ref 250–450)
UIBC: 357 ug/dL (ref 131–425)

## 2023-05-30 LAB — T4, FREE: Free T4: 0.79 ng/dL — ABNORMAL LOW (ref 0.82–1.77)

## 2023-05-31 LAB — URINE CULTURE

## 2023-06-04 ENCOUNTER — Other Ambulatory Visit: Payer: Self-pay | Admitting: Family Medicine

## 2023-06-04 DIAGNOSIS — G479 Sleep disorder, unspecified: Secondary | ICD-10-CM

## 2023-06-04 DIAGNOSIS — F411 Generalized anxiety disorder: Secondary | ICD-10-CM

## 2023-07-05 ENCOUNTER — Other Ambulatory Visit: Payer: Self-pay | Admitting: Nurse Practitioner

## 2023-07-05 DIAGNOSIS — D649 Anemia, unspecified: Secondary | ICD-10-CM

## 2023-07-05 NOTE — Progress Notes (Signed)
New Hematology/Oncology Consult   Requesting MD: Dr. Delynn Flavin  [phone]      Reason for Consult: Iron deficiency anemia  HPI: Debra [name] is a 32 year old woman referred for evaluation of iron deficiency anemia.  She had a CBC completed 05/29/2023 with hemoglobin returning at 10.4, MCV 93, white count 7.7 and platelet count 386,000; ferritin 45, TIBC 378, iron 21 and percent saturation 6.  Comparison CBC from 05/14/2023 (done in the emergency department, diagnosed with pyelonephritis) showed hemoglobin 11.9, MCV 93, white count 12.1, platelet count 113,000.  Blood work from 04/13/2022 shows a hemoglobin of 7.5, MCV 62, ferritin 7.  She reports she has been taking oral iron for many months.  Seems to be tolerating well.  She relates black stools to being on oral iron.  No bleeding other than her menstrual cycle.  Since having a miscarriage in September 2022 she has noted menstrual cycles occur every 3 to 4 weeks with heavier bleeding and lasting more days.  She estimates 5 to 6 days of heavy bleeding with blood clots and cramps.  She eats a diet.  She does not crave ice.  No tongue soreness.  No hair loss.  Nails are brittle.  No cracks at the corners of the lips.  Energy level varies.  No shortness of breath.     Past Medical History:  Diagnosis Date   Contraceptive management    Hypothyroidism    Iron deficiency anemia    Postpartum hypertension 2018     Past Surgical History:  Procedure Laterality Date   HERNIA REPAIR Bilateral 1998   WISDOM TOOTH EXTRACTION       Current Outpatient Medications:    EPINEPHrine (EPIPEN 2-PAK) 0.3 mg/0.3 mL IJ SOAJ injection, Inject 0.3 mg into the muscle as needed for anaphylaxis (Then call 911/ go to nearest ER)., Disp: 2 each, Rfl: 0   ferrous sulfate (SLOW RELEASE IRON) 160 (50 Fe) MG TBCR SR tablet, Take 1 tablet (160 mg total) by mouth daily., Disp: 90 tablet, Rfl: 3   ibuprofen (ADVIL) 600 MG tablet, Take 1 tablet (600 mg total) by  mouth every 8 (eight) hours as needed., Disp: 30 tablet, Rfl: 0   levothyroxine (SYNTHROID) 50 MCG tablet, Take 1 tablet (50 mcg total) by mouth daily., Disp: 90 tablet, Rfl: 3   azelastine (ASTELIN) 0.1 % nasal spray, Place 1 spray into both nostrils 2 (two) times daily. (Patient not taking: Reported on 07/06/2023), Disp: 30 mL, Rfl: 12   baclofen (LIORESAL) 10 MG tablet, Take 1 tablet (10 mg total) by mouth 3 (three) times daily. (Patient not taking: Reported on 07/06/2023), Disp: 30 each, Rfl: 0   diclofenac (VOLTAREN) 75 MG EC tablet, Take 1 tablet (75 mg total) by mouth 2 (two) times daily. (Patient not taking: Reported on 07/06/2023), Disp: 60 tablet, Rfl: 0   mirtazapine (REMERON) 7.5 MG tablet, Take 1 tablet (7.5 mg total) by mouth at bedtime. For anxiety/ sleep/ appetite (Patient not taking: Reported on 07/06/2023), Disp: 90 tablet, Rfl: 0:     Allergies  Allergen Reactions   Bee Venom Anaphylaxis and Hives    FH: Father has a history of head neck cancer.  Paternal grandfather died of lung cancer.  Both father and paternal grandfather smoked.  No family history of anemia.  SOCIAL HISTORY: She lives in [place].  She has a 17-year-old daughter.  She recently began college.  She works from home.  She smokes, currently 1/2 pack/day or less.  She is trying  to quit.  After the miscarriage in 2022 she began drinking 5-6 beers per day.  This continued until a few months ago when she had the pyelonephritis and she discontinued alcohol completely.  Review of Systems: No fevers or sweats.  She has a good appetite.  No unusual headaches.  No vision change.  No dysphagia.  No shortness of breath or cough.  No nausea or vomiting.  She has had diarrhea intermittently over the past month or so.  No rectal bleeding.  No hematuria.  She intermittently notes numbness in the hands and feet.  Physical Exam:  Blood pressure (!) 118/90, pulse 74, temperature 98.1 F (36.7 C), temperature source Oral, resp. rate  18, height 5\' 9"  (1.753 m), weight 121 lb (54.9 kg), SpO2 99%.  HEENT: White coating over tongue.  No buccal thrush. Lungs: Lungs clear bilaterally. Cardiac: Regular rate and rhythm. Abdomen: No hepatosplenomegaly. Vascular: No leg edema. Lymph nodes: No palpable cervical, supraclavicular or axillary lymph nodes.  Shotty bilateral inguinal nodes. Neurologic: Alert and oriented. Skin: No rash.   LABS:   Recent Labs    07/06/23 1048  WBC 5.7  HGB 13.8  HCT 41.2  PLT 179   Blood smear-few targets and microcytes, rare teardrop, variation in red cell size, polychromasia not increased; white cell morphology unremarkable; platelets appear normal in number, rare large platelet.  Recent Labs    07/06/23 1048  NA 136  K 3.2*  CL 103  CO2 23  GLUCOSE 75  BUN 11  CREATININE 0.72  CALCIUM 9.1      RADIOLOGY:  No results found.  Assessment and Plan:   History of iron deficiency anemia Menorrhagia Pyelonephritis 05/14/2023 Tobacco use History of EtOH use Hypothyroid  Debra [name] has a history of iron deficiency anemia likely due to menorrhagia.  She is taking oral iron.  Hemoglobin, MCV and ferritin in normal range today.  She was diagnosed with pyelonephritis in June of this year.  The drop in her hemoglobin around at that time may have been due to acute illness.  We discussed how alcohol can acutely affect bone marrow function and over time could cause liver disease that can affect the blood counts.  We recommended she continue to abstain from alcohol.  She is trying to quit smoking.  We encouraged her with her progress.  The chemistry panel from today shows mild hypokalemia.  We will forward to Dr. Nadine Counts.  We did not schedule additional follow-up in the hematology clinic.  We are available to see her in the future as needed.  Patient seen with Dr. Truett Perna.      Lonna Cobb, NP 07/06/2023, 12:45 PM  This was a shared with Lonna Cobb.  Debra [name] was  interviewed and examined.  I reviewed the peripheral blood smear.  She is referred for evaluation of anemia.  She has a history of iron deficiency anemia, likely secondary to menorrhagia.  She was recently diagnosed with pyelonephritis.  The hemoglobin is normal today.  I suspect the drop in the hemoglobin last month was related to the acute urinary tract infection.  The history of iron deficiency anemia is likely secondary to menstrual blood loss.  We recommended she continue iron.  We also recommend she discontinue smoking and alcohol use.  I recommend she have follow-up of the CBC via Dr. Nadine Counts.  We are available to see her again if she develops progressive anemia or a new hematologic abnormality.  Mancel Bale, MD

## 2023-07-06 ENCOUNTER — Other Ambulatory Visit: Payer: Self-pay | Admitting: Family Medicine

## 2023-07-06 ENCOUNTER — Telehealth: Payer: Self-pay

## 2023-07-06 ENCOUNTER — Inpatient Hospital Stay: Payer: Medicaid Other | Admitting: Nurse Practitioner

## 2023-07-06 ENCOUNTER — Inpatient Hospital Stay: Payer: Medicaid Other

## 2023-07-06 ENCOUNTER — Encounter: Payer: Self-pay | Admitting: Nurse Practitioner

## 2023-07-06 VITALS — BP 118/90 | HR 74 | Temp 98.1°F | Resp 18 | Ht 69.0 in | Wt 121.0 lb

## 2023-07-06 DIAGNOSIS — Z79899 Other long term (current) drug therapy: Secondary | ICD-10-CM | POA: Diagnosis not present

## 2023-07-06 DIAGNOSIS — F17210 Nicotine dependence, cigarettes, uncomplicated: Secondary | ICD-10-CM | POA: Diagnosis not present

## 2023-07-06 DIAGNOSIS — D649 Anemia, unspecified: Secondary | ICD-10-CM

## 2023-07-06 DIAGNOSIS — D509 Iron deficiency anemia, unspecified: Secondary | ICD-10-CM | POA: Diagnosis present

## 2023-07-06 DIAGNOSIS — Z801 Family history of malignant neoplasm of trachea, bronchus and lung: Secondary | ICD-10-CM | POA: Diagnosis not present

## 2023-07-06 DIAGNOSIS — E876 Hypokalemia: Secondary | ICD-10-CM

## 2023-07-06 DIAGNOSIS — E039 Hypothyroidism, unspecified: Secondary | ICD-10-CM

## 2023-07-06 DIAGNOSIS — N920 Excessive and frequent menstruation with regular cycle: Secondary | ICD-10-CM | POA: Diagnosis not present

## 2023-07-06 LAB — CMP (CANCER CENTER ONLY)
ALT: 23 U/L (ref 0–44)
AST: 41 U/L (ref 15–41)
Albumin: 3.9 g/dL (ref 3.5–5.0)
Alkaline Phosphatase: 58 U/L (ref 38–126)
Anion gap: 10 (ref 5–15)
BUN: 11 mg/dL (ref 6–20)
CO2: 23 mmol/L (ref 22–32)
Calcium: 9.1 mg/dL (ref 8.9–10.3)
Chloride: 103 mmol/L (ref 98–111)
Creatinine: 0.72 mg/dL (ref 0.44–1.00)
GFR, Estimated: >60 mL/min (ref >60)
Glucose, Bld: 75 mg/dL (ref 70–99)
Potassium: 3.2 mmol/L — ABNORMAL LOW (ref 3.5–5.1)
Sodium: 136 mmol/L (ref 135–145)
Total Bilirubin: 0.5 mg/dL (ref 0.3–1.2)
Total Protein: 7.5 g/dL (ref 6.5–8.1)

## 2023-07-06 LAB — CBC WITH DIFFERENTIAL (CANCER CENTER ONLY)
Abs Immature Granulocytes: 0.02 10*3/uL (ref 0.00–0.07)
Basophils Absolute: 0 10*3/uL (ref 0.0–0.1)
Basophils Relative: 1 %
Eosinophils Absolute: 0.1 10*3/uL (ref 0.0–0.5)
Eosinophils Relative: 2 %
HCT: 41.2 % (ref 36.0–46.0)
Hemoglobin: 13.8 g/dL (ref 12.0–15.0)
Immature Granulocytes: 0 %
Lymphocytes Relative: 26 %
Lymphs Abs: 1.5 10*3/uL (ref 0.7–4.0)
MCH: 30.2 pg (ref 26.0–34.0)
MCHC: 33.5 g/dL (ref 30.0–36.0)
MCV: 90.2 fL (ref 80.0–100.0)
Monocytes Absolute: 0.5 10*3/uL (ref 0.1–1.0)
Monocytes Relative: 9 %
Neutro Abs: 3.5 10*3/uL (ref 1.7–7.7)
Neutrophils Relative %: 62 %
Platelet Count: 179 10*3/uL (ref 150–400)
RBC: 4.57 MIL/uL (ref 3.87–5.11)
RDW: 16.1 % — ABNORMAL HIGH (ref 11.5–15.5)
WBC Count: 5.7 10*3/uL (ref 4.0–10.5)
nRBC: 0 % (ref 0.0–0.2)

## 2023-07-06 LAB — RETIC PANEL
Immature Retic Fract: 6.6 % (ref 2.3–15.9)
RBC.: 4.64 MIL/uL (ref 3.87–5.11)
Retic Count, Absolute: 77.5 10*3/uL (ref 19.0–186.0)
Retic Ct Pct: 1.7 % (ref 0.4–3.1)
Reticulocyte Hemoglobin: 35.7 pg (ref >27.9)

## 2023-07-06 LAB — VITAMIN B12: Vitamin B-12: 318 pg/mL (ref 180–914)

## 2023-07-06 LAB — FERRITIN: Ferritin: 37 ng/mL (ref 11–307)

## 2023-07-06 LAB — SAVE SMEAR(SSMR), FOR PROVIDER SLIDE REVIEW

## 2023-07-06 MED ORDER — POTASSIUM CHLORIDE CRYS ER 10 MEQ PO TBCR
10.0000 meq | EXTENDED_RELEASE_TABLET | Freq: Every day | ORAL | 0 refills | Status: AC
Start: 2023-07-06 — End: ?

## 2023-07-06 NOTE — Telephone Encounter (Signed)
-----   Message from Lonna Cobb sent at 07/06/2023  2:38 PM EDT ----- Please forward PCP lab results from today, she has mild hypokalemia.

## 2023-07-06 NOTE — Telephone Encounter (Signed)
Forward the lab results to the patient PCP. Raliegh Ip, DO  P [facility]; Dimitri Ped, LPN I've ordered potassium and sent to pharmacy for persistent low K.    [facility], Please have her come back in for repeat BMP and Mag level in 1 week.  Future orders are in but needs lab apt.

## 2023-07-09 ENCOUNTER — Other Ambulatory Visit: Payer: Self-pay

## 2023-07-13 ENCOUNTER — Other Ambulatory Visit: Payer: Medicaid Other

## 2023-07-13 DIAGNOSIS — E876 Hypokalemia: Secondary | ICD-10-CM

## 2023-07-13 LAB — BASIC METABOLIC PANEL: Glucose: 86 mg/dL (ref 70–99)

## 2023-07-14 LAB — MAGNESIUM: Magnesium: 1.6 mg/dL (ref 1.6–2.3)

## 2023-07-14 LAB — BASIC METABOLIC PANEL
BUN/Creatinine Ratio: 7 — ABNORMAL LOW (ref 9–23)
BUN: 6 mg/dL (ref 6–20)
CO2: 20 mmol/L (ref 20–29)
Calcium: 8.7 mg/dL (ref 8.7–10.2)
Chloride: 100 mmol/L (ref 96–106)
Creatinine, Ser: 0.81 mg/dL (ref 0.57–1.00)
Potassium: 3.7 mmol/L (ref 3.5–5.2)
Sodium: 137 mmol/L (ref 134–144)
eGFR: 99 mL/min/{1.73_m2} (ref >59)

## 2023-07-28 ENCOUNTER — Emergency Department (HOSPITAL_BASED_OUTPATIENT_CLINIC_OR_DEPARTMENT_OTHER): Payer: Medicaid Other | Admitting: Radiology

## 2023-07-28 ENCOUNTER — Encounter (HOSPITAL_BASED_OUTPATIENT_CLINIC_OR_DEPARTMENT_OTHER): Payer: Self-pay

## 2023-07-28 ENCOUNTER — Emergency Department (HOSPITAL_BASED_OUTPATIENT_CLINIC_OR_DEPARTMENT_OTHER)
Admission: EM | Admit: 2023-07-28 | Discharge: 2023-07-28 | Disposition: A | Discharge status: Home / Self Care | Payer: Medicaid Other | Attending: Emergency Medicine | Admitting: Emergency Medicine

## 2023-07-28 DIAGNOSIS — R0789 Other chest pain: Secondary | ICD-10-CM | POA: Insufficient documentation

## 2023-07-28 DIAGNOSIS — R202 Paresthesia of skin: Secondary | ICD-10-CM | POA: Insufficient documentation

## 2023-07-28 DIAGNOSIS — E876 Hypokalemia: Secondary | ICD-10-CM | POA: Insufficient documentation

## 2023-07-28 DIAGNOSIS — R002 Palpitations: Secondary | ICD-10-CM | POA: Insufficient documentation

## 2023-07-28 LAB — URINALYSIS, ROUTINE W REFLEX MICROSCOPIC
Bilirubin Urine: NEGATIVE
Glucose, UA: NEGATIVE mg/dL
Hgb urine dipstick: NEGATIVE
Ketones, ur: NEGATIVE mg/dL
Leukocytes,Ua: NEGATIVE
Nitrite: NEGATIVE
Protein, ur: NEGATIVE mg/dL
Specific Gravity, Urine: 1.009 (ref 1.005–1.030)
pH: 6 (ref 5.0–8.0)

## 2023-07-28 LAB — BASIC METABOLIC PANEL
Anion gap: 10 (ref 5–15)
BUN: 5 mg/dL — ABNORMAL LOW (ref 6–20)
CO2: 26 mmol/L (ref 22–32)
Calcium: 8.3 mg/dL — ABNORMAL LOW (ref 8.9–10.3)
Chloride: 101 mmol/L (ref 98–111)
Creatinine, Ser: 0.65 mg/dL (ref 0.44–1.00)
GFR, Estimated: >60 mL/min (ref >60)
Glucose, Bld: 115 mg/dL — ABNORMAL HIGH (ref 70–99)
Potassium: 3.3 mmol/L — ABNORMAL LOW (ref 3.5–5.1)
Sodium: 137 mmol/L (ref 135–145)

## 2023-07-28 LAB — CBC
HCT: 40.7 % (ref 36.0–46.0)
Hemoglobin: 13.8 g/dL (ref 12.0–15.0)
MCH: 30.3 pg (ref 26.0–34.0)
MCHC: 33.9 g/dL (ref 30.0–36.0)
MCV: 89.5 fL (ref 80.0–100.0)
Platelets: 149 10*3/uL — ABNORMAL LOW (ref 150–400)
RBC: 4.55 MIL/uL (ref 3.87–5.11)
RDW: 16.7 % — ABNORMAL HIGH (ref 11.5–15.5)
WBC: 4.1 10*3/uL (ref 4.0–10.5)
nRBC: 0 % (ref 0.0–0.2)

## 2023-07-28 LAB — PREGNANCY, URINE: Preg Test, Ur: NEGATIVE

## 2023-07-28 LAB — CBG MONITORING, ED: Glucose-Capillary: 112 mg/dL — ABNORMAL HIGH (ref 70–99)

## 2023-07-28 LAB — TROPONIN I (HIGH SENSITIVITY): Troponin I (High Sensitivity): 4 ng/L (ref <18)

## 2023-07-28 NOTE — Discharge Instructions (Signed)
Please pick up the potassium prescription that you stated your doctor sent to the pharmacy for your low potassium level.

## 2023-07-28 NOTE — ED Triage Notes (Signed)
Yesterday and last night had chest pain.  States still having tightness central chest.  Appears NAD

## 2023-07-28 NOTE — ED Provider Notes (Signed)
[place] EMERGENCY DEPARTMENT AT [facility] Provider Note   CSN: [id number] Arrival date & time: 07/28/23  1225     History  Chief Complaint  Patient presents with   Chest Pain    Steffie Kleinpeter is a 32 y.o. female presenting to ED with constellation of symptoms.  Patient reports that she had palpitations, felt chest pressure last night, then tingling in her hands and feet.  She says she felt uncomfortable cannot fall sleep all night.  Says she was having severe heartburn last night but does suffer from heartburn and reflux.  He continues to feel unwell today.  She denies any dysuria hematuria.  She reports recent ED visit for UTI.  She was also admitted and had evaluation for chest pain in the past, most recently earlier this year, with an echocardiogram in April which I reviewed which showed no emergent findings.  Her EF was 55 to 60%.  HPI     Home Medications Prior to Admission medications   Medication Sig Start Date End Date Taking? Authorizing Provider  azelastine (ASTELIN) 0.1 % nasal spray Place 1 spray into both nostrils 2 (two) times daily. Patient not taking: Reported on 07/06/2023 02/05/23   Raliegh Ip, DO  baclofen (LIORESAL) 10 MG tablet Take 1 tablet (10 mg total) by mouth 3 (three) times daily. Patient not taking: Reported on 07/06/2023 05/10/23   Junie Spencer, FNP  diclofenac (VOLTAREN) 75 MG EC tablet Take 1 tablet (75 mg total) by mouth 2 (two) times daily. Patient not taking: Reported on 07/06/2023 05/10/23   Junie Spencer, FNP  EPINEPHrine (EPIPEN 2-PAK) 0.3 mg/0.3 mL IJ SOAJ injection Inject 0.3 mg into the muscle as needed for anaphylaxis (Then call 911/ go to nearest ER). 05/29/23   Delynn Flavin M, DO  ferrous sulfate (SLOW RELEASE IRON) 160 (50 Fe) MG TBCR SR tablet Take 1 tablet (160 mg total) by mouth daily. 12/11/22   Raliegh Ip, DO  ibuprofen (ADVIL) 600 MG tablet Take 1 tablet (600 mg total) by mouth every 8 (eight)  hours as needed. 04/12/22   Daryll Drown, NP  levothyroxine (SYNTHROID) 50 MCG tablet Take 1 tablet (50 mcg total) by mouth daily. 09/11/22   Raliegh Ip, DO  mirtazapine (REMERON) 7.5 MG tablet Take 1 tablet (7.5 mg total) by mouth at bedtime. For anxiety/ sleep/ appetite Patient not taking: Reported on 07/06/2023 05/29/23   Raliegh Ip, DO  potassium chloride (KLOR-CON M) 10 MEQ tablet Take 1 tablet (10 mEq total) by mouth daily. 07/06/23   Raliegh Ip, DO      Allergies    Bee venom    Review of Systems   Review of Systems  Physical Exam Updated Vital Signs BP (!) 141/93 (BP Location: Right Arm)   Pulse 93   Temp 98.2 F (36.8 C) (Oral)   Resp 17   Ht 5\' 9"  (1.753 m)   Wt 54.4 kg   SpO2 100%   BMI 17.72 kg/m  Physical Exam Constitutional:      General: She is not in acute distress. HENT:     Head: Normocephalic and atraumatic.  Eyes:     Conjunctiva/sclera: Conjunctivae normal.     Pupils: Pupils are equal, round, and reactive to light.  Cardiovascular:     Rate and Rhythm: Normal rate and regular rhythm.  Pulmonary:     Effort: Pulmonary effort is normal. No respiratory distress.  Abdominal:     General: There  is no distension.     Tenderness: There is no abdominal tenderness.  Skin:    General: Skin is warm and dry.  Neurological:     General: No focal deficit present.     Mental Status: She is alert. Mental status is at baseline.  Psychiatric:        Mood and Affect: Mood normal.        Behavior: Behavior normal.     ED Results / Procedures / Treatments   Labs (all labs ordered are listed, but only abnormal results are displayed) Labs Reviewed  BASIC METABOLIC PANEL - Abnormal; Notable for the following components:      Result Value   Potassium 3.3 (*)    Glucose, Bld 115 (*)    BUN 5 (*)    Calcium 8.3 (*)    All other components within normal limits  CBC - Abnormal; Notable for the following components:   RDW 16.7 (*)     Platelets 149 (*)    All other components within normal limits  CBG MONITORING, ED - Abnormal; Notable for the following components:   Glucose-Capillary 112 (*)    All other components within normal limits  PREGNANCY, URINE  URINALYSIS, ROUTINE W REFLEX MICROSCOPIC  TROPONIN I (HIGH SENSITIVITY)    EKG EKG Interpretation Date/Time:  Saturday July 28 2023 12:37:34 EDT Ventricular Rate:  91 PR Interval:  116 QRS Duration:  86 QT Interval:  356 QTC Calculation: 437 R Axis:   189  Text Interpretation: Normal sinus rhythm T wave inversions inderior leads and lateral leads noted on prior tracing May 14 2023 without significant changes Confirmed by Alvester Chou [id number]) on 07/28/2023 12:41:13 PM  Radiology DG Chest 2 View  Result Date: 07/28/2023 CLINICAL DATA:  Chest pain EXAM: CHEST - 2 VIEW COMPARISON:  05/14/2023 FINDINGS: The heart size and mediastinal contours are within normal limits. Both lungs are clear. The visualized skeletal structures are unremarkable. IMPRESSION: No active cardiopulmonary disease. Electronically Signed   By: Duanne Guess D.O.   On: 07/28/2023 14:23    Procedures Procedures    Medications Ordered in ED Medications - No data to display  ED Course/ Medical Decision Making/ A&P Clinical Course as of 07/28/23 1439  Sat Jul 28, 2023  1437 Patient reassessed and telemetry reviewed, no significant events.  She is feeling fine.  She says she has a potassium prescription waiting for her at the pharmacy regarding her mild hypokalemia.  She is otherwise stable for discharge. [MT]    Clinical Course User Index [MT] Eligah Anello, Kermit Balo, MD                                 Medical Decision Making Amount and/or Complexity of Data Reviewed Labs: ordered. Radiology: ordered.   This patient presents to the ED with concern for paresthesias, intermittent chest pains, reflux, palpitations. This involves an extensive number of treatment options, and is a  complaint that carries with it a high risk of complications and morbidity.  The differential diagnosis includes PVCs versus arrhythmia versus anemia versus electrolyte derangement versus other  External records from outside source obtained and reviewed including echocardiogram report from April as noted above  I ordered and personally interpreted labs.  The pertinent results include: Mild hypokalemia which the patient is aware of, mild hypocalcemia, no emergent findings.  I ordered imaging studies including x-ray of the chest I independently visualized and interpreted  imaging which showed no emergent finding I agree with the radiologist interpretation  The patient was maintained on a cardiac monitor.  I personally viewed and interpreted the cardiac monitored which showed an underlying rhythm of: Sinus rhythm  Per my interpretation the patient's ECG shows sinus rhythm with chronic T wave inversions unchanged from prior imaging  I have reviewed the patients home medicines and have made adjustments as needed  Test Considered: PERC negative, low suspicion for acute PE  After the interventions noted above, I reevaluated the patient and found that they have: stayed the same   Dispostion:  After consideration of the diagnostic results and the patients response to treatment, I feel that the patent would benefit from outpatient follow-up.         Final Clinical Impression(s) / ED Diagnoses Final diagnoses:  Hypokalemia  Paresthesia  Palpitations    Rx / DC Orders ED Discharge Orders     None         Lajoyce Tamura, Kermit Balo, MD 07/28/23 1440

## 2023-07-31 ENCOUNTER — Ambulatory Visit (INDEPENDENT_AMBULATORY_CARE_PROVIDER_SITE_OTHER): Payer: Medicaid Other | Admitting: Family Medicine

## 2023-07-31 ENCOUNTER — Encounter: Payer: Self-pay | Admitting: Family Medicine

## 2023-07-31 VITALS — BP 130/87 | HR 60 | Temp 98.5°F | Ht 69.0 in | Wt 122.0 lb

## 2023-07-31 DIAGNOSIS — E039 Hypothyroidism, unspecified: Secondary | ICD-10-CM

## 2023-07-31 DIAGNOSIS — E876 Hypokalemia: Secondary | ICD-10-CM | POA: Diagnosis not present

## 2023-07-31 DIAGNOSIS — F411 Generalized anxiety disorder: Secondary | ICD-10-CM | POA: Diagnosis not present

## 2023-07-31 NOTE — Progress Notes (Signed)
Subjective: CC: Follow-up mood generalized anxiety disorder PCP: Raliegh Ip, DO VHQ:[id number] Debra [name] is a 32 y.o. female presenting to clinic today for:  1.  Generalized anxiety disorder with panic attack Patient reports that she had another episode over the weekend.  She was seen in the ER and she was hypokalemic again.  She had T wave inversions on EKG but no other abnormalities identified.  She did not start on oral potassium because she did not realize she was supposed to.  She notes that she is only been using the mirtazapine sporadically as she did not realize it needed to build in the system.  She has not seen a tremendous improvement in sleep or panic when she does take it but again this has been sporadic.   ROS: Per HPI  Allergies  Allergen Reactions   Bee Venom Anaphylaxis and Hives   Past Medical History:  Diagnosis Date   Contraceptive management    Hypothyroidism    Iron deficiency anemia    Postpartum hypertension 2018    Current Outpatient Medications:    azelastine (ASTELIN) 0.1 % nasal spray, Place 1 spray into both nostrils 2 (two) times daily. (Patient not taking: Reported on 07/06/2023), Disp: 30 mL, Rfl: 12   baclofen (LIORESAL) 10 MG tablet, Take 1 tablet (10 mg total) by mouth 3 (three) times daily. (Patient not taking: Reported on 07/06/2023), Disp: 30 each, Rfl: 0   diclofenac (VOLTAREN) 75 MG EC tablet, Take 1 tablet (75 mg total) by mouth 2 (two) times daily. (Patient not taking: Reported on 07/06/2023), Disp: 60 tablet, Rfl: 0   EPINEPHrine (EPIPEN 2-PAK) 0.3 mg/0.3 mL IJ SOAJ injection, Inject 0.3 mg into the muscle as needed for anaphylaxis (Then call 911/ go to nearest ER)., Disp: 2 each, Rfl: 0   ferrous sulfate (SLOW RELEASE IRON) 160 (50 Fe) MG TBCR SR tablet, Take 1 tablet (160 mg total) by mouth daily., Disp: 90 tablet, Rfl: 3   ibuprofen (ADVIL) 600 MG tablet, Take 1 tablet (600 mg total) by mouth every 8 (eight) hours as needed., Disp: 30  tablet, Rfl: 0   levothyroxine (SYNTHROID) 50 MCG tablet, Take 1 tablet (50 mcg total) by mouth daily., Disp: 90 tablet, Rfl: 3   mirtazapine (REMERON) 7.5 MG tablet, Take 1 tablet (7.5 mg total) by mouth at bedtime. For anxiety/ sleep/ appetite (Patient not taking: Reported on 07/06/2023), Disp: 90 tablet, Rfl: 0   potassium chloride (KLOR-CON M) 10 MEQ tablet, Take 1 tablet (10 mEq total) by mouth daily., Disp: 90 tablet, Rfl: 0 Social History   Socioeconomic History   Marital status: Single    Spouse name: Not on file   Number of children: 1   Years of education: Not on file   Highest education level: 12th grade  Occupational History   Not on file  Tobacco Use   Smoking status: Every Day    Current packs/day: 1.00    Average packs/day: 1 pack/day for 14.0 years (14.0 ttl pk-yrs)    Types: Cigarettes   Smokeless tobacco: Never  Vaping Use   Vaping status: Never Used  Substance and Sexual Activity   Alcohol use: Yes    Comment: To excess   Drug use: No   Sexual activity: Yes    Birth control/protection: None  Other Topics Concern   Not on file  Social History Narrative   Married. Has 1 living child that is 64 years old.    Works from home   [employer]  1 ppd since age 31.   Social Determinants of Health   Financial Resource Strain: Low Risk  (07/06/2023)   Overall Financial Resource Strain (CARDIA)    Difficulty of Paying Living Expenses: Not hard at all  Recent Concern: Financial Resource Strain - Medium Risk (05/10/2023)   Overall Financial Resource Strain (CARDIA)    Difficulty of Paying Living Expenses: Somewhat hard  Food Insecurity: No Food Insecurity (07/06/2023)   Hunger Vital Sign    Worried About Running Out of Food in the Last Year: Never true    Ran Out of Food in the Last Year: Never true  Recent Concern: Food Insecurity - Food Insecurity Present (05/10/2023)   Hunger Vital Sign    Worried About Running Out of Food in the Last Year: Sometimes true    Ran Out of  Food in the Last Year: Sometimes true  Transportation Needs: No Transportation Needs (07/06/2023)   PRAPARE - Administrator, Civil Service (Medical): No    Lack of Transportation (Non-Medical): No  Physical Activity: Inactive (07/06/2023)   Exercise Vital Sign    Days of Exercise per Week: 0 days    Minutes of Exercise per Session: 0 min  Stress: No Stress Concern Present (07/06/2023)   [employer] of Occupational Health - Occupational Stress Questionnaire    Feeling of Stress : Only a little  Social Connections: Socially Integrated (07/06/2023)   Social Connection and Isolation Panel [NHANES]    Frequency of Communication with Friends and Family: Twice a week    Frequency of Social Gatherings with Friends and Family: Twice a week    Attends Religious Services: 1 to 4 times per year    Active Member of [employer] or Organizations: No    Attends Banker Meetings: 1 to 4 times per year    Marital Status: Living with partner  Intimate Partner Violence: Not At Risk (07/06/2023)   Humiliation, Afraid, Rape, and Kick questionnaire    Fear of Current or Ex-Partner: No    Emotionally Abused: No    Physically Abused: No    Sexually Abused: No   Family History  Problem Relation Age of Onset   Healthy Mother    Squamous cell carcinoma Father        upper airway/ oropharynx. required extensive resection 2020   COPD Father    Asthma Brother    Other Maternal Grandmother        MVA   Other Maternal Grandfather        fibrosis of lungs   Cancer Paternal Grandmother    COPD Paternal Grandmother    Cancer Paternal Grandfather        lung    Heart attack Maternal Uncle     Objective: Office vital signs reviewed. BP 130/87   Pulse 60   Temp 98.5 F (36.9 C)   Ht 5\' 9"  (1.753 m)   Wt 122 lb (55.3 kg)   LMP 07/02/2023   SpO2 99%   BMI 18.02 kg/m   Physical Examination:  General: Awake, alert, thin nontoxic female, No acute distress HEENT: Sclera white.   Moist mucous membranes Cardio: regular rate and rhythm  Pulm: normal work of breathing on room air Psych: Mood is stable, speech is normal, affect is appropriate.     07/31/2023    1:27 PM 07/06/2023   11:15 AM 07/06/2023   11:14 AM  Depression screen PHQ 2/9  Decreased Interest 1 0 0  Down, Depressed, Hopeless 0  0 0  PHQ - 2 Score 1 0 0  Altered sleeping 2    Tired, decreased energy 1    Change in appetite 0    Feeling bad or failure about yourself  0    Trouble concentrating 0    Moving slowly or fidgety/restless 0    Suicidal thoughts 0    PHQ-9 Score 4    Difficult doing work/chores Not difficult at all        07/31/2023    1:33 PM 07/31/2023    1:27 PM 05/14/2023    2:07 PM 02/05/2023    9:48 AM  GAD 7 : Generalized Anxiety Score  Nervous, Anxious, on Edge 2 0 0 0  Control/stop worrying  0 0 0  Worry too much - different things  0 0 0  Trouble relaxing 2 0 0 0  Restless  0 0 0  Easily annoyed or irritable  0 0 0  Afraid - awful might happen  0 0 0  Total GAD 7 Score  0 0 0  Anxiety Difficulty    Not difficult at all     Assessment/ Plan: 32 y.o. female   Hypokalemia  Acquired hypothyroidism  Generalized anxiety disorder  Start potassium supplement.  We reviewed her EKG in detail today.  We will plan for repeat thyroid levels at next visit along with repeat BMP  Recommended regular use of mirtazapine.  I think that this would improve her anxiety symptoms and sleep better with more consistent use.   Raliegh Ip, DO [facility] [phone]

## 2023-10-27 ENCOUNTER — Other Ambulatory Visit: Payer: Self-pay | Admitting: Family Medicine

## 2023-10-27 DIAGNOSIS — E039 Hypothyroidism, unspecified: Secondary | ICD-10-CM

## 2024-09-25 ENCOUNTER — Encounter (HOSPITAL_BASED_OUTPATIENT_CLINIC_OR_DEPARTMENT_OTHER): Payer: Self-pay | Admitting: Emergency Medicine

## 2024-09-25 ENCOUNTER — Emergency Department (HOSPITAL_BASED_OUTPATIENT_CLINIC_OR_DEPARTMENT_OTHER): Admitting: Radiology

## 2024-09-25 ENCOUNTER — Other Ambulatory Visit: Payer: Self-pay

## 2024-09-25 ENCOUNTER — Emergency Department (HOSPITAL_BASED_OUTPATIENT_CLINIC_OR_DEPARTMENT_OTHER): Admission: EM | Admit: 2024-09-25 | Discharge: 2024-09-25 | Disposition: A | Discharge status: Home / Self Care

## 2024-09-25 DIAGNOSIS — Y9351 Activity, roller skating (inline) and skateboarding: Secondary | ICD-10-CM | POA: Insufficient documentation

## 2024-09-25 DIAGNOSIS — S20211A Contusion of right front wall of thorax, initial encounter: Secondary | ICD-10-CM | POA: Insufficient documentation

## 2024-09-25 DIAGNOSIS — Z043 Encounter for examination and observation following other accident: Secondary | ICD-10-CM | POA: Diagnosis not present

## 2024-09-25 DIAGNOSIS — S52125A Nondisplaced fracture of head of left radius, initial encounter for closed fracture: Secondary | ICD-10-CM | POA: Insufficient documentation

## 2024-09-25 DIAGNOSIS — Z79899 Other long term (current) drug therapy: Secondary | ICD-10-CM | POA: Insufficient documentation

## 2024-09-25 DIAGNOSIS — S20213A Contusion of bilateral front wall of thorax, initial encounter: Secondary | ICD-10-CM | POA: Diagnosis not present

## 2024-09-25 DIAGNOSIS — S59912A Unspecified injury of left forearm, initial encounter: Secondary | ICD-10-CM | POA: Diagnosis present

## 2024-09-25 LAB — PREGNANCY, URINE: Preg Test, Ur: NEGATIVE

## 2024-09-25 MED ORDER — OXYCODONE-ACETAMINOPHEN 5-325 MG PO TABS
1.0000 | ORAL_TABLET | Freq: Once | ORAL | Status: DC
  Filled 2024-09-25: qty 1

## 2024-09-25 MED ORDER — HYDROCODONE-ACETAMINOPHEN 5-325 MG PO TABS
1.0000 | ORAL_TABLET | Freq: Four times a day (QID) | ORAL | 0 refills | Status: DC | PRN
Start: 2024-09-25 — End: 2024-10-06

## 2024-09-25 NOTE — ED Notes (Signed)
Discharge instructions, follow up care, and prescription reviewed and explained, pt verbalized understanding and had no further questions on d/c.  

## 2024-09-25 NOTE — ED Triage Notes (Signed)
 Pt caox4, ambulatory c/o L arm pain and R chest wall/rib pain since falling when roller skating last night. Pain worsens on ROM, when coughing and taking a deep breath.

## 2024-09-25 NOTE — ED Provider Notes (Signed)
 [place] EMERGENCY DEPARTMENT AT [facility] Provider Note   CSN: [id number] Arrival date & time: 09/25/24  1100     Patient presents with: No chief complaint on file.   Debra [name] is a 33 y.o. female.   The history is provided by the patient and medical records. No language interpreter was used.     33 year old female presenting with complaint of injury from a fall.  Patient states she was rollerblading at a skating rink last night when she lost balance, fell forward and striking the right side of her chest as well as her left elbow and both knees against the ground.  She did not hit her head and no loss of consciousness.  She is endorsing pain primarily to the right side of chest worse with movement and with breathing.  States she feels like she got the wind knocked out of her when she fell.  She also complaining of pain to her left arm more significant to her left elbow.  Increasing pain with movement.  Denies any significant pain to her left shoulder or left wrist or hand.  She did strike both of her knees and have noticed bruising to the knees but states pain is minimal.  She did take some ibuprofen  at home.  Last menstrual period was approximately 5 days ago.  She is not on blood thinning medication.  She denies any back pain.  Prior to Admission medications   Medication Sig Start Date End Date Taking? Authorizing Provider  azelastine  (ASTELIN ) 0.1 % nasal spray Place 1 spray into both nostrils 2 (two) times daily. 02/05/23   Jolinda Norene HERO, DO  baclofen  (LIORESAL ) 10 MG tablet Take 1 tablet (10 mg total) by mouth 3 (three) times daily. 05/10/23   Lavell Bari LABOR, FNP  diclofenac  (VOLTAREN ) 75 MG EC tablet Take 1 tablet (75 mg total) by mouth 2 (two) times daily. 05/10/23   Lavell Bari LABOR, FNP  EPINEPHrine  (EPIPEN  2-PAK) 0.3 mg/0.3 mL IJ SOAJ injection Inject 0.3 mg into the muscle as needed for anaphylaxis (Then call 911/ go to nearest ER). 05/29/23   Jolinda Norene HERO, DO  ferrous sulfate  (SLOW RELEASE IRON ) 160 (50 Fe) MG TBCR SR tablet Take 1 tablet (160 mg total) by mouth daily. 12/11/22   Jolinda Norene HERO, DO  ibuprofen  (ADVIL ) 600 MG tablet Take 1 tablet (600 mg total) by mouth every 8 (eight) hours as needed. 04/12/22   Ijaola, Onyeje M, NP  levothyroxine  (SYNTHROID ) 50 MCG tablet TAKE ONE TABLET ONCE DAILY 10/29/23   Jolinda Norene M, DO  mirtazapine  (REMERON ) 7.5 MG tablet Take 1 tablet (7.5 mg total) by mouth at bedtime. For anxiety/ sleep/ appetite 05/29/23   Jolinda Norene M, DO  potassium chloride  (KLOR-CON  M) 10 MEQ tablet Take 1 tablet (10 mEq total) by mouth daily. 07/06/23   Jolinda Norene HERO, DO    Allergies: Bee venom    Review of Systems  All other systems reviewed and are negative.   Updated Vital Signs BP (!) 121/93 (BP Location: Right Arm)   Pulse 91   Temp 98.6 F (37 C) (Oral)   Resp 19   Ht 5' 9 (1.753 m)   Wt 53.1 kg   LMP 09/17/2024 (Approximate)   SpO2 99%   BMI 17.28 kg/m   Physical Exam Vitals and nursing note reviewed.  Constitutional:      General: She is not in acute distress.    Appearance: She is well-developed.  HENT:  Head: Normocephalic and atraumatic.  Eyes:     Conjunctiva/sclera: Conjunctivae normal.  Cardiovascular:     Rate and Rhythm: Normal rate and regular rhythm.     Pulses: Normal pulses.     Heart sounds: Normal heart sounds.  Pulmonary:     Effort: Pulmonary effort is normal.     Breath sounds: No wheezing, rhonchi or rales.  Chest:     Chest wall: Tenderness (Tenderness to right anterolateral chest wall without any crepitus or emphysema no bruising noted.  Lung sounds present) present.  Abdominal:     Palpations: Abdomen is soft.     Tenderness: There is no abdominal tenderness.  Musculoskeletal:        General: Tenderness (Left elbow: Tenderness about the elbow with decreased flexion extension.  Ecchymosis noted to the elbow superiorly without deformity.  No  tenderness to left shoulder or left wrist.  Radial pulse 2+) present.     Cervical back: Neck supple.     Comments: Bruising noted to bilateral anterior knees with mild tenderness to palpation but able to flex and extend the knee without difficulty.  Skin:    Findings: No rash.  Neurological:     Mental Status: She is alert and oriented to person, place, and time.  Psychiatric:        Mood and Affect: Mood normal.     (all labs ordered are listed, but only abnormal results are displayed) Labs Reviewed  PREGNANCY, URINE    EKG: None  Radiology: DG Elbow Complete Left Result Date: 09/25/2024 EXAM: 3 VIEW(S) XRAY OF THE LEFT ELBOW COMPARISON: None available. CLINICAL HISTORY: fall FINDINGS: BONES AND JOINTS: Nondisplaced radial head/neck junction fracture. Elbow joint effusion. No focal osseous lesion. No joint dislocation. SOFT TISSUES: Medial subcutaneous soft tissue edema. IMPRESSION: 1. Nondisplaced radial head/neck junction fracture. 2. Elbow joint effusion. 3. Medial subcutaneous soft tissue edema. Electronically signed by: Waddell Calk MD 09/25/2024 01:06 PM EST RP Workstation: [id number]   DG Ribs Unilateral W/Chest Right Result Date: 09/25/2024 EXAM: 1 VIEW(S) XRAY OF THE UNILATERAL RIBS AND AP CHEST 09/25/2024 11:56:10 AM COMPARISON: 07/28/23 CLINICAL HISTORY: fall FINDINGS: BONES: No acute displaced rib fracture. LUNGS AND PLEURA: No consolidation or pulmonary edema. No pleural effusion or pneumothorax. HEART AND MEDIASTINUM: No acute abnormality of the cardiac and mediastinal silhouettes. IMPRESSION: 1. No acute rib fracture detected. Electronically signed by: Waddell Calk MD 09/25/2024 01:05 PM EST RP Workstation: [id number]     Procedures   Medications Ordered in the ED - No data to display                                  Medical Decision Making Amount and/or Complexity of Data Reviewed Labs: ordered. Radiology: ordered.  Risk Prescription drug  management.   BP (!) 121/93 (BP Location: Right Arm)   Pulse 91   Temp 98.6 F (37 C) (Oral)   Resp 19   Ht 5' 9 (1.753 m)   Wt 53.1 kg   LMP 09/17/2024 (Approximate)   SpO2 99%   BMI 17.28 kg/m   45:37 AM  33 year old female presenting with complaint of injury from a fall.  Patient states she was rollerblading at a skating rink last night when she lost balance, fell forward and striking the right side of her chest as well as her left elbow and both knees against the ground.  She did not hit her head and no loss  of consciousness.  She is endorsing pain primarily to the right side of chest worse with movement and with breathing.  States she feels like she got the wind knocked out of her when she fell.  She also complaining of pain to her left arm more significant to her left elbow.  Increasing pain with movement.  Denies any significant pain to her left shoulder or left wrist or hand.  She did strike both of her knees and have noticed bruising to the knees but states pain is minimal.  She did take some ibuprofen  at home.  Last menstrual period was approximately 5 days ago.  She is not on blood thinning medication.  She denies any back pain.  Exam notable for tenderness about the left elbow, right anterolateral chest wall, and bilateral knees.  X-ray ordered.  Pain medication offered but patient declined.  She does not have any significant midline spine tenderness or signs of head injury.  Labs including pregnancy test came back negative.  X-ray of the right ribs and chest along with x-ray of the left elbow was obtained independently viewed interpret by me which shows evidence of nondisplaced radial head/neck junction fracture.  Will place him elbow in a sugar-tong splint and will provide sling for support.  Opiate pain medication provided, pt refused.  I also made patient aware that the possibility of a missed occult rib fracture.  I also discussed definitive rib fracture care with  patient.  SPLINT APPLICATION Date/Time: 1:45 PM Authorized by: Lonni Camp Consent: Verbal consent obtained. Risks and benefits: risks, benefits and alternatives were discussed Consent given by: patient Splint applied by: orthopedic technician Location details: L elbow Splint type: sugar tong Supplies used: ortho-glass Post-procedure: The splinted body part was neurovascularly unchanged following the procedure. Patient tolerance: Patient tolerated the procedure well with no immediate complications.       Final diagnoses:  Closed nondisplaced fracture of head of left radius, initial encounter  Contusion of chest wall, right, initial encounter    ED Discharge Orders          Ordered    HYDROcodone-acetaminophen  (NORCO/VICODIN) 5-325 MG tablet  Every 6 hours PRN        09/25/24 1358               Camp Lonni, PA-C 09/25/24 1402    Kammerer, Megan L, DO 09/26/24 1303

## 2024-09-25 NOTE — Discharge Instructions (Addendum)
 You have been diagnosed with broken left elbow.  Wear splint and sling as provided.  Call and follow-up with hand specialist and 1 week for further evaluation and management.  Fortunately x-ray of your chest did not show any broken ribs.  However some time x-ray may missed small break.  Apply a pillow against your chest when you cough or laugh to decrease pain and discomfort.  Take pain medication as prescribed but be aware it can cause drowsiness.

## 2024-09-26 DIAGNOSIS — M25522 Pain in left elbow: Secondary | ICD-10-CM | POA: Diagnosis not present

## 2024-09-27 ENCOUNTER — Encounter (HOSPITAL_BASED_OUTPATIENT_CLINIC_OR_DEPARTMENT_OTHER): Payer: Self-pay

## 2024-09-27 ENCOUNTER — Emergency Department (HOSPITAL_BASED_OUTPATIENT_CLINIC_OR_DEPARTMENT_OTHER): Admitting: Radiology

## 2024-09-27 ENCOUNTER — Emergency Department (HOSPITAL_BASED_OUTPATIENT_CLINIC_OR_DEPARTMENT_OTHER)
Admission: EM | Admit: 2024-09-27 | Discharge: 2024-09-27 | Disposition: A | Discharge status: Home / Self Care | Attending: Emergency Medicine | Admitting: Emergency Medicine

## 2024-09-27 ENCOUNTER — Other Ambulatory Visit: Payer: Self-pay

## 2024-09-27 DIAGNOSIS — M25512 Pain in left shoulder: Secondary | ICD-10-CM | POA: Diagnosis not present

## 2024-09-27 DIAGNOSIS — S6992XA Unspecified injury of left wrist, hand and finger(s), initial encounter: Secondary | ICD-10-CM | POA: Diagnosis not present

## 2024-09-27 DIAGNOSIS — M79602 Pain in left arm: Secondary | ICD-10-CM | POA: Diagnosis not present

## 2024-09-27 NOTE — ED Notes (Signed)
 Splint completed PT tolerated well and denies pain or numbness, cap refill <3 sec.

## 2024-09-27 NOTE — ED Triage Notes (Signed)
 PT to exam 16 c/o Left arm numbness resulting from splint that was placed earlier yesterday at urgent care. Pt states her fingers were numb and discolored upon waking from bed. PT to see Ortho on 11/11 for radial Fx. Splint wrap removed and cap refill returned to nail beds. VSS NAD PT on room air.

## 2024-09-27 NOTE — ED Notes (Signed)
 Splint completely removed.

## 2024-09-27 NOTE — ED Provider Notes (Signed)
 [place] EMERGENCY DEPARTMENT AT [facility] Provider Note   CSN: [id number] Arrival date & time: 09/27/24  0416     History Chief Complaint  Patient presents with   Hand Pain    HPI Debra [name] is a 33 y.o. female presenting for ongoing left arm pain.  Seen recently.  Had a radial head fracture.  However at that time she did not realize that her shoulder and wrist were also hurting.  Denies fevers chills nausea vomiting shortness of breath. States that this morning she could not feel her hand.  This is her second presentation for hand numbness after having a sugar-tong splint placed.  Had to be loosened yesterday and then again this morning woke up without being able to feel her hands..   Patient's recorded medical, surgical, social, medication list and allergies were reviewed in the Snapshot window as part of the initial history.   Review of Systems   Review of Systems  Constitutional:  Negative for chills and fever.  HENT:  Negative for ear pain and sore throat.   Eyes:  Negative for pain and visual disturbance.  Respiratory:  Negative for cough and shortness of breath.   Cardiovascular:  Negative for chest pain and palpitations.  Gastrointestinal:  Negative for abdominal pain and vomiting.  Genitourinary:  Negative for dysuria and hematuria.  Musculoskeletal:  Negative for arthralgias and back pain.  Skin:  Negative for color change and rash.  Neurological:  Negative for seizures and syncope.  All other systems reviewed and are negative.   Physical Exam Updated Vital Signs BP 121/87 (BP Location: Right Arm)   Pulse 88   Temp 98.3 F (36.8 C) (Oral)   Resp 17   Ht 5' 9 (1.753 m)   Wt 53.1 kg   LMP 09/17/2024 (Approximate)   SpO2 99%   BMI 17.28 kg/m  Physical Exam Constitutional:      General: She is not in acute distress.    Appearance: She is not ill-appearing or toxic-appearing.  HENT:     Head: Normocephalic and atraumatic.  Eyes:      Extraocular Movements: Extraocular movements intact.     Pupils: Pupils are equal, round, and reactive to light.  Cardiovascular:     Rate and Rhythm: Normal rate.  Pulmonary:     Effort: No respiratory distress.  Abdominal:     General: Abdomen is flat.  Musculoskeletal:        General: No swelling, deformity or signs of injury.     Cervical back: Normal range of motion. No rigidity.  Skin:    General: Skin is warm and dry.  Neurological:     General: No focal deficit present.     Mental Status: She is alert and oriented to person, place, and time.  Psychiatric:        Mood and Affect: Mood normal.      ED Course/ Medical Decision Making/ A&P    Procedures Procedures   Medications Ordered in ED Medications - No data to display  Medical Decision Making:   Otherwise healthy 33 year old female presenting with left arm pain.  She now has involvement of her left shoulder and left wrist.  X-rays of these affected extremities were also added on.  Splint was loosened and replaced. X-rays reviewed showing no acute injury.  Patient much improved on serial reevaluation of her neurovascular status.   clinical Impression:  1. Pain of left upper extremity      Data Unavailable  Final Clinical Impression(s) / ED Diagnoses Final diagnoses:  Pain of left upper extremity    Rx / DC Orders ED Discharge Orders     None         Jerral Meth, MD 09/27/24 [phone]

## 2024-09-30 ENCOUNTER — Other Ambulatory Visit: Payer: Self-pay | Admitting: Family Medicine

## 2024-09-30 ENCOUNTER — Ambulatory Visit: Payer: Self-pay

## 2024-09-30 DIAGNOSIS — S52125A Nondisplaced fracture of head of left radius, initial encounter for closed fracture: Secondary | ICD-10-CM | POA: Diagnosis not present

## 2024-09-30 DIAGNOSIS — M25522 Pain in left elbow: Secondary | ICD-10-CM | POA: Diagnosis not present

## 2024-09-30 DIAGNOSIS — M25532 Pain in left wrist: Secondary | ICD-10-CM | POA: Diagnosis not present

## 2024-09-30 NOTE — Telephone Encounter (Unsigned)
 Copied from CRM [id number]. Topic: Clinical - Medication Refill >> Sep 30, 2024 12:22 PM Montie POUR wrote: Medication:  HYDROcodone-acetaminophen  (NORCO/VICODIN) 5-325 MG tablet  She wants to see if she can get a 15 day supply. She has an appointment on 10/06/24.  Has the patient contacted their pharmacy? Yes (Agent: If no, request that the patient contact the pharmacy for the refill. If patient does not wish to contact the pharmacy document the reason why and proceed with request.) (Agent: If yes, when and what did the pharmacy advise?) Pharmacy needs order to refill  This is the patient's preferred pharmacy:  [facility] [place], [place] - [address] [place] [address] Phone: [phone] Fax: [phone]  Is this the correct pharmacy for this prescription? Yes If no, delete pharmacy and type the correct one.   Has the prescription been filled recently? No  Is the patient out of the medication? Yes  Has the patient been seen for an appointment in the last year OR does the patient have an upcoming appointment? Yes  Can we respond through MyChart? Yes  Agent: Please be advised that Rx refills may take up to 3 business days. We ask that you follow-up with your pharmacy.

## 2024-09-30 NOTE — Telephone Encounter (Signed)
Noted  -LS

## 2024-09-30 NOTE — Telephone Encounter (Signed)
 FYI Only or Action Required?: FYI only for provider: appointment scheduled on 10/06/24.  Patient was last seen in primary care on 07/31/2023 by Jolinda Norene HERO, DO.  Called Nurse Triage reporting Arm Injury.  Symptoms began several days ago.  Interventions attempted: OTC medications: Tylenol  and ibuprofen  and Prescription medications: Norco.  Symptoms are: unchanged.  Triage Disposition: See PCP When Office is Open (Within 3 Days)  Patient/caregiver understands and will follow disposition?: No, refuses disposition  Copied from CRM #[id number]. Topic: Clinical - Red Word Triage >> Sep 30, 2024 12:27 PM Montie POUR wrote: Red Word that prompted transfer to Nurse Triage:  Her left elbow and arm is at a pain level of an 8. Emergency room notes are in her chart. I made an appointment and then she starting talk the pain.  Reason for Disposition  [1] After 3 days AND [2] pain not improved  Answer Assessment - Initial Assessment Questions Pt had fall [place] skating rink on Wednesday 11[place]5. Went to ED on 11/6 and had cast and sling put on which got twisted after leaving hospital. [facility]ina to [facility] the following day on Friday 11/7 and had cast redone. Reports hand started to swell that night and lose all feeling so pt returned to ED on 11/8 to have cast re-done. Reports return of sensation in fingers with mild tingling now and resolution of swelling. Offered to schedule hospital f/u appt within next 3 days. Declines d/t schedule obligations. Scheduled for soonest pt availability on Monday 11/17 with different provider at home office d/t no PCP availability. Pt also asking if limited refill of  norco can be issued until her f/u appt. Refill request submitted, notified pt it can take up to 3 days to process. Pt receptive. Advised UC or ED for worsening symptoms.   1. MECHANISM: How did the injury happen?     Fell at skating rink  2. ONSET: When did the injury happen? (e.g., minutes, hours ago)       Wednesday 11/5  3. LOCATION: Where is the injury located? Which arm?     Broke left elbow, fractured 4 ribs on right   4. APPEARANCE of INJURY: What does the injury look like?      Hand feels a little tight and tingly still after having cast redone from being on too tight the first time and losing feeling initially. Feeling better now with some mild tingling.  5. SEVERITY: Can you use the arm normally?      Arm in a cast and sling for elbow fracture  6. SWELLING or BRUISING: is there any swelling or bruising? If Yes, ask: How large is it? (e.g., inches, centimeters)      On ribcage there is some bruising. Denies Sob or coughing up blood. Elbow is covered in bruises under the cast.  7. PAIN: Is there pain? If Yes, ask: How bad is the pain? (Scale 0-10; or none, mild, moderate, severe)     8/10, increases to 10/10 at night  8. TETANUS: For any breaks in the skin, ask: When was your last tetanus booster?     No open cuts besides some knee scrapes  9. OTHER SYMPTOMS: Do you have any other symptoms?  (e.g., numbness in hand)     Mild tingling in hand after having cast put on too tight. Had cast redone with significant improvement.  10. PREGNANCY: Is there any chance you are pregnant? When was your last menstrual period?       Denies.  Had negative pregnancy test in the ED.  Protocols used: Arm Injury-A-AH

## 2024-10-03 NOTE — Telephone Encounter (Signed)
 LMOVM that requested medication is for a controlled medication that we are not able to fill outside of an office visit and that 2 we can not refill a medication that we did not originally prescribe. If you have any questions please let us  know, we will see you Monday 10/06/24

## 2024-10-06 ENCOUNTER — Inpatient Hospital Stay: Admitting: Nurse Practitioner

## 2024-10-06 ENCOUNTER — Encounter: Payer: Self-pay | Admitting: Nurse Practitioner

## 2024-10-06 ENCOUNTER — Ambulatory Visit: Payer: Self-pay | Admitting: Nurse Practitioner

## 2024-10-06 ENCOUNTER — Ambulatory Visit (INDEPENDENT_AMBULATORY_CARE_PROVIDER_SITE_OTHER): Admitting: Nurse Practitioner

## 2024-10-06 ENCOUNTER — Ambulatory Visit

## 2024-10-06 VITALS — BP 123/87 | HR 119 | Temp 97.9°F | Ht 69.0 in | Wt 124.0 lb

## 2024-10-06 DIAGNOSIS — R059 Cough, unspecified: Secondary | ICD-10-CM | POA: Diagnosis not present

## 2024-10-06 DIAGNOSIS — R0789 Other chest pain: Secondary | ICD-10-CM

## 2024-10-06 DIAGNOSIS — J9811 Atelectasis: Secondary | ICD-10-CM | POA: Diagnosis not present

## 2024-10-06 DIAGNOSIS — R0989 Other specified symptoms and signs involving the circulatory and respiratory systems: Secondary | ICD-10-CM | POA: Diagnosis not present

## 2024-10-06 MED ORDER — HYDROCODONE-ACETAMINOPHEN 5-325 MG PO TABS: 1.0000 | ORAL_TABLET | Freq: Four times a day (QID) | ORAL | 0 refills | Status: AC | PRN

## 2024-10-06 NOTE — Patient Instructions (Signed)
 Rib Contusion A rib contusion is a deep bruise on the rib area. Contusions are the result of a blunt trauma that causes bleeding and injury to the tissues under the skin. A rib contusion may involve bruising of the ribs and of the skin and muscles in the area. The skin over the contusion may turn blue, purple, or yellow. Minor injuries result in a painless contusion. More severe contusions may be painful and swollen for a few weeks. What are the causes? This condition is usually caused by a hard, direct hit to an area of the body. This often occurs while playing contact sports. What are the signs or symptoms? Symptoms of this condition include: Swelling and redness of the injured area. Discoloration of the injured area. Tenderness and soreness of the injured area. Pain with or without movement. Pain when breathing in. How is this diagnosed? This condition may be diagnosed based on: Your symptoms and medical history. A physical exam. Imaging tests--such as an X-ray, CT scan, or MRI--to determine if there were internal injuries or broken bones (fractures). How is this treated? This condition may be treated with: Rest. This is often the best treatment for a rib contusion. Ice packs. This reduces swelling and inflammation. Deep-breathing exercises. These may be recommended to reduce the risk for lung collapse and pneumonia. Medicines. Over-the-counter or prescription medicines may be given to control pain. Injection of a numbing medicine around the nerve near your injury (nerve block). Follow these instructions at home: Medicines Take over-the-counter and prescription medicines only as told by your health care provider. Ask your health care provider if the medicine prescribed to you: Requires you to avoid driving or using machinery. Can cause constipation. You may need to take these actions to prevent or treat constipation: Drink enough fluid to keep your urine pale yellow. Take  over-the-counter or prescription medicines. Eat foods that are high in fiber, such as beans, whole grains, and fresh fruits and vegetables. Limit foods that are high in fat and processed sugars, such as fried or sweet foods. Managing pain, stiffness, and swelling If directed, put ice on the injured area. To do this: Put ice in a plastic bag. Place a towel between your skin and the bag. Leave the ice on for 20 minutes, 2-3 times a day. Remove the ice if your skin turns bright red. This is very important. If you cannot feel pain, heat, or cold, you have a greater risk of damage to the area.  Activity Rest the injured area. Avoid strenuous activity and any activities or movements that cause pain. Be careful during activities, and avoid bumping the injured area. Do not lift anything that is heavier than 5 lb (2.3 kg), or the limit that you are told, until your health care provider says that it is safe. General instructions  Do not use any products that contain nicotine or tobacco, such as cigarettes, e-cigarettes, and chewing tobacco. These can delay healing. If you need help quitting, ask your health care provider. Do deep-breathing exercises as told by your health care provider. If you were given an incentive spirometer, use it every 1-2 hours while you are awake, or as recommended by your health care provider. This device measures how well you are filling your lungs with each breath. Keep all follow-up visits. This is important. Contact a health care provider if you have: Increased bruising or swelling. Pain that is not controlled with treatment. A fever. Get help right away if you: Have difficulty breathing or  shortness of breath. Develop a continual cough, or you cough up thick or bloody mucus from your lungs (sputum). Feel nauseous or you vomit. Have pain in your abdomen. These symptoms may represent a serious problem that is an emergency. Do not wait to see if the symptoms will go  away. Get medical help right away. Call your local emergency services (911 in the [place] Do not drive yourself to the hospital. Summary A rib contusion is a deep bruise on your rib area. Contusions are the result of a blunt trauma that causes bleeding and injury to the tissues under the skin. The skin over the contusion may turn blue, purple, or yellow. Minor injuries may cause a painless contusion. More severe contusions may be painful and swollen for a few weeks. Rest the injured area. Avoid strenuous activity and any activities or movements that cause pain. This information is not intended to replace advice given to you by your health care provider. Make sure you discuss any questions you have with your health care provider. Document Revised: 09/24/2023 Document Reviewed: 09/24/2023 Elsevier Patient Education  2024 [employer].

## 2024-10-06 NOTE — Progress Notes (Addendum)
 Subjective:    Patient ID: Debra [name], female    DOB: Mar 31, 1991, 33 y.o.   MRN: [id number]   Chief Complaint: Hospitalization Follow-up (Having right side rib and side pain and concerned about pneumonia. Would also like her thyroid  and iron  checked)   HPI Patient went to the ED on 09/25/24 c/o left arm and hand pain. She fell roller skating. Has left proximal radius fracture.  She has seen ortho and they have her in a sling currently. Today she is c/o right rib pain and wants chest xray because she thinks she is getting pneumonia. She did not have any rib on xray  in ED. But now when she takes a deep breathe she feels pain in upper ribs on right side.  Says she is out of hr pain meds Patient Active Problem List   Diagnosis Date Noted   Patient desires pregnancy 05/01/2022   IUFD at less than 20 weeks of gestation 08/02/2021   History of postpartum hypertension 12/10/2017   History of prior pregnancy with IUGR newborn 10/16/2017   Smoker 05/02/2017       Review of Systems  Respiratory:  Negative for cough and shortness of breath.   Cardiovascular:  Positive for chest pain (right upper rib area).  Musculoskeletal:  Positive for arthralgias (left elbow).       Objective:   Physical Exam Constitutional:      Appearance: Normal appearance.  Cardiovascular:     Rate and Rhythm: Normal rate and regular rhythm.     Heart sounds: Normal heart sounds.  Pulmonary:     Breath sounds: Normal breath sounds.  Musculoskeletal:     Comments: Left arm in sling  Skin:    General: Skin is warm.  Neurological:     General: No focal deficit present.     Mental Status: She is alert and oriented to person, place, and time.  Psychiatric:        Mood and Affect: Mood normal.        Behavior: Behavior normal.    BP 123/87   Pulse (!) 119   Temp 97.9 F (36.6 C) (Temporal)   Ht 5' 9 (1.753 m)   Wt 124 lb (56.2 kg)   LMP 09/17/2024 (Approximate)   SpO2 99%   BMI 18.31 kg/m     CHest xray- no rib fracture no infiltrates- Preliminary reading by Ronal Lunger, FNP  [facility]      Assessment & Plan:   Debra [name] in today with chief complaint of Hospitalization Follow-up (Having right side rib and side pain and concerned about pneumonia. Would also like her thyroid  and iron  checked)   1. Rib pain on right side (Primary) Ice Deep breathe and cough every 2 hours to prevent pneumonia RTO  prn - DG Chest 2 View  Meds ordered this encounter  Medications   HYDROcodone-acetaminophen  (NORCO/VICODIN) 5-325 MG tablet    Sig: Take 1 tablet by mouth every 6 (six) hours as needed for moderate pain (pain score 4-6).    Dispense:  20 tablet    Refill:  0    Supervising Provider:   MARYANNE CHEW A [[id number]]     The above assessment and management plan was discussed with the patient. The patient verbalized understanding of and has agreed to the management plan. Patient is aware to call the clinic if symptoms persist or worsen. Patient is aware when to return to the clinic for a follow-up visit. Patient educated on when it is appropriate  to go to the emergency department.   Mary-Margaret Gladis, FNP

## 2024-10-06 NOTE — Addendum Note (Signed)
 Addended by: GLADIS MUSTARD on: 10/06/2024 10:24 AM   Modules accepted: Orders

## 2024-10-13 ENCOUNTER — Other Ambulatory Visit: Payer: Self-pay | Admitting: *Deleted

## 2024-10-13 DIAGNOSIS — D509 Iron deficiency anemia, unspecified: Secondary | ICD-10-CM

## 2024-10-14 ENCOUNTER — Other Ambulatory Visit: Payer: Self-pay | Admitting: Family Medicine

## 2024-10-14 ENCOUNTER — Ambulatory Visit: Admitting: Family Medicine

## 2024-10-14 DIAGNOSIS — E039 Hypothyroidism, unspecified: Secondary | ICD-10-CM

## 2024-10-14 DIAGNOSIS — D509 Iron deficiency anemia, unspecified: Secondary | ICD-10-CM

## 2024-10-20 ENCOUNTER — Other Ambulatory Visit: Payer: Self-pay | Admitting: Family Medicine

## 2024-10-20 ENCOUNTER — Other Ambulatory Visit

## 2024-10-20 DIAGNOSIS — E039 Hypothyroidism, unspecified: Secondary | ICD-10-CM

## 2024-10-20 DIAGNOSIS — D509 Iron deficiency anemia, unspecified: Secondary | ICD-10-CM | POA: Diagnosis not present

## 2024-10-21 ENCOUNTER — Ambulatory Visit: Payer: Self-pay | Admitting: Family Medicine

## 2024-10-21 LAB — IRON,TIBC AND FERRITIN PANEL
Ferritin: 34 ng/mL (ref 15–150)
Iron Saturation: 5 % — CL (ref 15–55)
Iron: 17 ug/dL — ABNORMAL LOW (ref 27–159)
Total Iron Binding Capacity: 310 ug/dL (ref 250–450)
UIBC: 293 ug/dL (ref 131–425)

## 2024-10-21 LAB — TSH+FREE T4
Free T4: 1.12 ng/dL (ref 0.82–1.77)
TSH: 0.569 u[IU]/mL (ref 0.450–4.500)

## 2024-10-22 ENCOUNTER — Telehealth: Payer: Self-pay | Admitting: Family Medicine

## 2024-10-22 NOTE — Telephone Encounter (Unsigned)
 Copied from CRM [id number]. Topic: Clinical - Lab/Test Results >> Oct 21, 2024  5:05 PM Zebedee SAUNDERS wrote: Reason for CRM: Pt returning Cox, Suzen PARAS, CMA message from [place]. Pt has questions regarding frequency of Fe, dosage and is there a new script sent to  [facility] [place], [place] - [address] [place] [address] Phone: [phone] Fax: [phone]  Please call pt at [phone].

## 2024-10-23 NOTE — Telephone Encounter (Signed)
 Contacted patient. Notified patient. Patient verbalized understanding.

## 2024-10-23 NOTE — Telephone Encounter (Signed)
 Review to lab results   Patient states she has been off of the iron  for a year. How would you like her to take the iron ? She will also need a new rx sent in.  Covering PCP- please advise

## 2024-11-05 ENCOUNTER — Ambulatory Visit: Admitting: Family Medicine

## 2024-11-05 ENCOUNTER — Encounter: Payer: Self-pay | Admitting: Family Medicine

## 2024-11-05 NOTE — Progress Notes (Deleted)
 Subjective: CC:*** PCP: Jolinda Norene HERO, DO YEP:Debra [name] is a 33 y.o. female presenting to clinic today for:  ***   ROS: Per HPI  Allergies[1] Past Medical History:  Diagnosis Date   Contraceptive management    Hypothyroidism    Iron  deficiency anemia    Postpartum hypertension 2018   Current Medications[2] Social History   Socioeconomic History   Marital status: Single    Spouse name: Not on file   Number of children: 1   Years of education: Not on file   Highest education level: Some college, no degree  Occupational History   Not on file  Tobacco Use   Smoking status: Every Day    Current packs/day: 1.00    Average packs/day: 1 pack/day for 14.0 years (14.0 ttl pk-yrs)    Types: Cigarettes   Smokeless tobacco: Never  Vaping Use   Vaping status: Never Used  Substance and Sexual Activity   Alcohol use: Yes    Comment: To excess   Drug use: No   Sexual activity: Yes    Birth control/protection: None  Other Topics Concern   Not on file  Social History Narrative   Married. Has 1 living child that is 70 years old.    Works from home   Smokes 1 ppd since age 55.   Social Drivers of Health   Tobacco Use: High Risk (10/06/2024)   Patient History    Smoking Tobacco Use: Every Day    Smokeless Tobacco Use: Never    Passive Exposure: Not on file  Financial Resource Strain: Medium Risk (10/13/2024)   Overall Financial Resource Strain (CARDIA)    Difficulty of Paying Living Expenses: Somewhat hard  Food Insecurity: Food Insecurity Present (10/13/2024)   Epic    Worried About Programme Researcher, Broadcasting/film/video in the Last Year: Sometimes true    Ran Out of Food in the Last Year: Never true  Transportation Needs: No Transportation Needs (10/13/2024)   Epic    Lack of Transportation (Medical): No    Lack of Transportation (Non-Medical): No  Physical Activity: Insufficiently Active (10/13/2024)   Exercise Vital Sign    Days of Exercise per Week: 2 days    Minutes  of Exercise per Session: 30 min  Stress: Stress Concern Present (10/13/2024)   [employer] of Occupational Health - Occupational Stress Questionnaire    Feeling of Stress: Rather much  Social Connections: Moderately Isolated (10/13/2024)   Social Connection and Isolation Panel    Frequency of Communication with Friends and Family: More than three times a week    Frequency of Social Gatherings with Friends and Family: Once a week    Attends Religious Services: More than 4 times per year    Active Member of [employer] or Organizations: No    Attends Engineer, Structural: Not on file    Marital Status: Never married  Intimate Partner Violence: Not At Risk (07/06/2023)   Humiliation, Afraid, Rape, and Kick questionnaire    Fear of Current or Ex-Partner: No    Emotionally Abused: No    Physically Abused: No    Sexually Abused: No  Depression (PHQ2-9): Medium Risk (10/06/2024)   Depression (PHQ2-9)    PHQ-2 Score: 5  Alcohol Screen: Medium Risk (10/13/2024)   Alcohol Screen    Last Alcohol Screening Score (AUDIT): 8  Housing: Low Risk (10/13/2024)   Epic    Unable to Pay for Housing in the Last Year: No    Number of Times  Moved in the Last Year: 0    Homeless in the Last Year: No  Utilities: Not At Risk (07/06/2023)   [employer] Utilities    Threatened with loss of utilities: No  Health Literacy: Adequate Health Literacy (07/06/2023)   B1300 Health Literacy    Frequency of need for help with medical instructions: Never   Family History  Problem Relation Age of Onset   Healthy Mother    Squamous cell carcinoma Father        upper airway/ oropharynx. required extensive resection 2020   COPD Father    Asthma Brother    Other Maternal Grandmother        MVA   Other Maternal Grandfather        fibrosis of lungs   Cancer Paternal Grandmother    COPD Paternal Grandmother    Cancer Paternal Grandfather        lung    Heart attack Maternal Uncle     Objective: Office vital  signs reviewed. There were no vitals taken for this visit.  Physical Examination:  General: Awake, alert, *** nourished, No acute distress HEENT: Normal    Neck: No masses palpated. No lymphadenopathy    Ears: Tympanic membranes intact, normal light reflex, no erythema, no bulging    Eyes: PERRLA, extraocular membranes intact, sclera ***    Nose: nasal turbinates moist, *** nasal discharge    Throat: moist mucus membranes, no erythema, *** tonsillar exudate.  Airway is patent Cardio: regular rate and rhythm, S1S2 heard, no murmurs appreciated Pulm: clear to auscultation bilaterally, no wheezes, rhonchi or rales; normal work of breathing on room air GI: soft, non-tender, non-distended, bowel sounds present x4, no hepatomegaly, no splenomegaly, no masses GU: external vaginal tissue ***, cervix ***, *** punctate lesions on cervix appreciated, *** discharge from cervical os, *** bleeding, *** cervical motion tenderness, *** abdominal/ adnexal masses Extremities: warm, well perfused, No edema, cyanosis or clubbing; +*** pulses bilaterally MSK: *** gait and *** station Skin: dry; intact; no rashes or lesions Neuro: *** Strength and light touch sensation grossly intact, *** DTRs ***/4  Assessment/ Plan: 33 y.o. female   Iron  deficiency anemia, unspecified iron  deficiency anemia type  Acquired hypothyroidism  Hypothyroidism, unspecified type   ***   Yamna Mackel M Cheston Coury, DO [facility] [phone]     [1]  Allergies Allergen Reactions   Bee Venom Anaphylaxis and Hives  [2]  Current Outpatient Medications:    azelastine  (ASTELIN ) 0.1 % nasal spray, Place 1 spray into both nostrils 2 (two) times daily. (Patient not taking: Reported on 10/06/2024), Disp: 30 mL, Rfl: 12   baclofen  (LIORESAL ) 10 MG tablet, Take 1 tablet (10 mg total) by mouth 3 (three) times daily. (Patient not taking: Reported on 10/06/2024), Disp: 30 each, Rfl: 0   diclofenac  (VOLTAREN ) 75  MG EC tablet, Take 1 tablet (75 mg total) by mouth 2 (two) times daily. (Patient not taking: Reported on 10/06/2024), Disp: 60 tablet, Rfl: 0   EPINEPHrine  (EPIPEN  2-PAK) 0.3 mg/0.3 mL IJ SOAJ injection, Inject 0.3 mg into the muscle as needed for anaphylaxis (Then call 911/ go to nearest ER)., Disp: 2 each, Rfl: 0   ferrous sulfate  (SLOW RELEASE IRON ) 160 (50 Fe) MG TBCR SR tablet, Take 1 tablet (160 mg total) by mouth daily. (Patient not taking: Reported on 10/06/2024), Disp: 90 tablet, Rfl: 3   HYDROcodone -acetaminophen  (NORCO/VICODIN) 5-325 MG tablet, Take 1 tablet by mouth every 6 (six) hours as needed for moderate pain (  pain score 4-6)., Disp: 20 tablet, Rfl: 0   ibuprofen  (ADVIL ) 600 MG tablet, Take 1 tablet (600 mg total) by mouth every 8 (eight) hours as needed., Disp: 30 tablet, Rfl: 0   levothyroxine  (SYNTHROID ) 50 MCG tablet, Take 1 tablet (50 mcg total) by mouth daily. Must have OV for further fills, Disp: 30 tablet, Rfl: 0   mirtazapine  (REMERON ) 7.5 MG tablet, Take 1 tablet (7.5 mg total) by mouth at bedtime. For anxiety/ sleep/ appetite (Patient not taking: Reported on 10/06/2024), Disp: 90 tablet, Rfl: 0   potassium chloride  (KLOR-CON  M) 10 MEQ tablet, Take 1 tablet (10 mEq total) by mouth daily. (Patient not taking: Reported on 10/06/2024), Disp: 90 tablet, Rfl: 0

## 2024-11-24 ENCOUNTER — Encounter: Admitting: Obstetrics & Gynecology

## 2024-12-01 ENCOUNTER — Encounter: Payer: Self-pay | Admitting: Family Medicine

## 2024-12-19 ENCOUNTER — Telehealth: Payer: Self-pay | Admitting: *Deleted

## 2024-12-19 DIAGNOSIS — M79603 Pain in arm, unspecified: Secondary | ICD-10-CM

## 2024-12-19 NOTE — Progress Notes (Signed)
 Complex Care Management Note  Care Guide Note 12/19/2024 Name: Jaisha Villacres MRN: [id number] DOB: [birth year]  Charlye Spare is a 34 y.o. year old female who sees Jolinda Norene HERO, DO for primary care. I reached out to [facility] by phone today to offer complex care management services.  Ms. Mackel was given information about Complex Care Management services today including:   The Complex Care Management services include support from the care team which includes your Nurse Care Manager, Clinical Social Worker, or Pharmacist.  The Complex Care Management team is here to help remove barriers to the health concerns and goals most important to you. Complex Care Management services are voluntary, and the patient may decline or stop services at any time by request to their care team member.   Complex Care Management Consent Status: Patient agreed to services and verbal consent obtained.   Follow up plan:  Telephone appointment with complex care management team member scheduled for:  01/07/25 at 1:00 PM  Encounter Outcome:  Patient Scheduled  Harlene Satterfield  [facility]  [facility], [facility] Guide  Direct Dial: [phone]  Fax [phone]

## 2024-12-19 NOTE — Progress Notes (Signed)
 Complex Care Management Note Care Guide Note  12/19/2024 Name: Debra [name] MRN: [id number] DOB: 14-Mar-1991   Complex Care Management Outreach Attempts: An unsuccessful telephone outreach was attempted today to offer the patient information about available complex care management services.  Follow Up Plan:  Additional outreach attempts will be made to offer the patient complex care management information and services.   Encounter Outcome:  No Answer  Harlene Satterfield  [facility]  [facility], [facility] Guide  Direct Dial: [phone]  Fax [phone]

## 2025-01-07 ENCOUNTER — Telehealth: Admitting: *Deleted
